# Patient Record
Sex: Male | Born: 1975 | Race: White | Hispanic: No | Marital: Single | Smoking: Current every day smoker
Health system: Southern US, Community
[De-identification: ages and names within clinical notes are randomized; demographics above are authoritative.]

## PROBLEM LIST (undated history)

## (undated) DIAGNOSIS — Z87442 Personal history of urinary calculi: Secondary | ICD-10-CM

## (undated) DIAGNOSIS — Z860100 Personal history of colon polyps, unspecified: Secondary | ICD-10-CM

## (undated) DIAGNOSIS — L0390 Cellulitis, unspecified: Secondary | ICD-10-CM

## (undated) DIAGNOSIS — I10 Essential (primary) hypertension: Secondary | ICD-10-CM

## (undated) DIAGNOSIS — R7303 Prediabetes: Secondary | ICD-10-CM

## (undated) HISTORY — DX: Personal history of colon polyps, unspecified: Z86.0100

## (undated) HISTORY — DX: Prediabetes: R73.03

## (undated) HISTORY — DX: Personal history of colonic polyps: Z86.010

## (undated) HISTORY — PX: VASECTOMY: SHX75

---
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
## 2006-09-15 HISTORY — PX: INGUINAL HERNIA REPAIR: SUR1180

## 2020-10-02 ENCOUNTER — Emergency Department (HOSPITAL_COMMUNITY): Payer: Worker's Compensation | Admitting: Anesthesiology

## 2020-10-02 ENCOUNTER — Observation Stay (HOSPITAL_COMMUNITY)
Admission: EM | Admit: 2020-10-02 | Discharge: 2020-10-03 | Disposition: A | Discharge status: Home / Self Care | Payer: Worker's Compensation | Attending: Orthopedic Surgery | Admitting: Orthopedic Surgery

## 2020-10-02 ENCOUNTER — Emergency Department (HOSPITAL_COMMUNITY): Payer: Self-pay

## 2020-10-02 ENCOUNTER — Emergency Department (HOSPITAL_COMMUNITY): Payer: Worker's Compensation

## 2020-10-02 ENCOUNTER — Emergency Department (HOSPITAL_COMMUNITY): Payer: Self-pay | Attending: Orthopedic Surgery

## 2020-10-02 ENCOUNTER — Other Ambulatory Visit: Payer: Self-pay

## 2020-10-02 ENCOUNTER — Encounter (HOSPITAL_COMMUNITY)
Admission: EM | Disposition: A | Discharge status: Home / Self Care | Payer: Self-pay | Source: Home / Self Care | Attending: Emergency Medicine

## 2020-10-02 DIAGNOSIS — Z20822 Contact with and (suspected) exposure to covid-19: Secondary | ICD-10-CM | POA: Insufficient documentation

## 2020-10-02 DIAGNOSIS — Y9269 Other specified industrial and construction area as the place of occurrence of the external cause: Secondary | ICD-10-CM | POA: Diagnosis not present

## 2020-10-02 DIAGNOSIS — W000XXA Fall on same level due to ice and snow, initial encounter: Secondary | ICD-10-CM | POA: Insufficient documentation

## 2020-10-02 DIAGNOSIS — S82852A Displaced trimalleolar fracture of left lower leg, initial encounter for closed fracture: Principal | ICD-10-CM | POA: Insufficient documentation

## 2020-10-02 DIAGNOSIS — T148XXA Other injury of unspecified body region, initial encounter: Secondary | ICD-10-CM

## 2020-10-02 DIAGNOSIS — S99912A Unspecified injury of left ankle, initial encounter: Secondary | ICD-10-CM | POA: Diagnosis present

## 2020-10-02 DIAGNOSIS — Y990 Civilian activity done for income or pay: Secondary | ICD-10-CM | POA: Diagnosis not present

## 2020-10-02 DIAGNOSIS — Z419 Encounter for procedure for purposes other than remedying health state, unspecified: Secondary | ICD-10-CM

## 2020-10-02 DIAGNOSIS — M21962 Unspecified acquired deformity of left lower leg: Secondary | ICD-10-CM

## 2020-10-02 HISTORY — PX: ORIF ANKLE FRACTURE: SHX5408

## 2020-10-02 LAB — RESP PANEL BY RT-PCR (FLU A&B, COVID) ARPGX2
Influenza A by PCR: NEGATIVE
Influenza B by PCR: NEGATIVE
SARS Coronavirus 2 by RT PCR: NEGATIVE

## 2020-10-02 SURGERY — OPEN REDUCTION INTERNAL FIXATION (ORIF) ANKLE FRACTURE
Anesthesia: General | Site: Ankle | Laterality: Left

## 2020-10-02 MED ORDER — PROPOFOL 10 MG/ML IV BOLUS
INTRAVENOUS | Status: AC
  Filled 2020-10-02: qty 20

## 2020-10-02 MED ORDER — LIDOCAINE-EPINEPHRINE (PF) 1.5 %-1:200000 IJ SOLN
INTRAMUSCULAR | Status: DC | PRN
  Administered 2020-10-02: 15 mL via PERINEURAL

## 2020-10-02 MED ORDER — PROMETHAZINE HCL 25 MG/ML IJ SOLN
6.2500 mg | INTRAMUSCULAR | Status: DC | PRN
Start: 2020-10-02 — End: 2020-10-02

## 2020-10-02 MED ORDER — 0.9 % SODIUM CHLORIDE (POUR BTL) OPTIME
TOPICAL | Status: DC | PRN
  Administered 2020-10-02: 1000 mL

## 2020-10-02 MED ORDER — POVIDONE-IODINE 10 % EX SWAB: 2.0000 "application " | Freq: Once | CUTANEOUS | Status: DC

## 2020-10-02 MED ORDER — MORPHINE SULFATE (PF) 2 MG/ML IV SOLN
0.5000 mg | INTRAVENOUS | Status: DC | PRN
Start: 2020-10-02 — End: 2020-10-03

## 2020-10-02 MED ORDER — CHLORHEXIDINE GLUCONATE 0.12 % MT SOLN: 15.0000 mL | Freq: Once | OROMUCOSAL | Status: AC

## 2020-10-02 MED ORDER — LIDOCAINE 2% (20 MG/ML) 5 ML SYRINGE
INTRAMUSCULAR | Status: DC | PRN
  Administered 2020-10-02: 100 mg via INTRAVENOUS

## 2020-10-02 MED ORDER — FENTANYL CITRATE (PF) 250 MCG/5ML IJ SOLN
INTRAMUSCULAR | Status: AC
  Filled 2020-10-02: qty 5

## 2020-10-02 MED ORDER — FENTANYL CITRATE (PF) 100 MCG/2ML IJ SOLN
INTRAMUSCULAR | Status: AC
  Administered 2020-10-02: 50 ug via INTRAVENOUS
  Filled 2020-10-02: qty 2

## 2020-10-02 MED ORDER — METHOCARBAMOL 1000 MG/10ML IJ SOLN: 500.0000 mg | Freq: Four times a day (QID) | INTRAVENOUS | Status: DC | PRN

## 2020-10-02 MED ORDER — ONDANSETRON HCL 4 MG/2ML IJ SOLN
INTRAMUSCULAR | Status: AC
  Filled 2020-10-02: qty 4

## 2020-10-02 MED ORDER — CEFAZOLIN SODIUM-DEXTROSE 2-4 GM/100ML-% IV SOLN
2.0000 g | INTRAVENOUS | Status: AC
  Administered 2020-10-02: 2 g via INTRAVENOUS
  Filled 2020-10-02: qty 100

## 2020-10-02 MED ORDER — ETOMIDATE 2 MG/ML IV SOLN
INTRAVENOUS | Status: AC | PRN
  Administered 2020-10-02: 10 mg via INTRAVENOUS

## 2020-10-02 MED ORDER — ACETAMINOPHEN 500 MG PO TABS
500.0000 mg | ORAL_TABLET | Freq: Four times a day (QID) | ORAL | Status: DC
  Administered 2020-10-02 – 2020-10-03 (×2): 500 mg via ORAL
  Filled 2020-10-02 (×3): qty 1

## 2020-10-02 MED ORDER — HYDROMORPHONE HCL 1 MG/ML IJ SOLN
1.0000 mg | Freq: Once | INTRAMUSCULAR | Status: AC
  Administered 2020-10-02: 1 mg via INTRAVENOUS
  Filled 2020-10-02: qty 1

## 2020-10-02 MED ORDER — METOCLOPRAMIDE HCL 5 MG PO TABS: 5.0000 mg | ORAL_TABLET | Freq: Three times a day (TID) | ORAL | Status: DC | PRN

## 2020-10-02 MED ORDER — CHLORHEXIDINE GLUCONATE 0.12 % MT SOLN
OROMUCOSAL | Status: AC
  Administered 2020-10-02: 15 mL via OROMUCOSAL
  Filled 2020-10-02: qty 15

## 2020-10-02 MED ORDER — HYDROCODONE-ACETAMINOPHEN 7.5-325 MG PO TABS
1.0000 | ORAL_TABLET | ORAL | Status: DC | PRN
Start: 2020-10-02 — End: 2020-10-03
  Administered 2020-10-02: 2 via ORAL
  Administered 2020-10-03: 1 via ORAL
  Administered 2020-10-03: 2 via ORAL
  Filled 2020-10-02 (×3): qty 2

## 2020-10-02 MED ORDER — PROPOFOL 10 MG/ML IV BOLUS
INTRAVENOUS | Status: DC | PRN
  Administered 2020-10-02: 200 mg via INTRAVENOUS

## 2020-10-02 MED ORDER — OXYCODONE HCL 5 MG/5ML PO SOLN: 5.0000 mg | Freq: Once | ORAL | Status: DC | PRN

## 2020-10-02 MED ORDER — DEXAMETHASONE SODIUM PHOSPHATE 10 MG/ML IJ SOLN
INTRAMUSCULAR | Status: AC
  Filled 2020-10-02: qty 2

## 2020-10-02 MED ORDER — ASPIRIN 81 MG PO CHEW
81.0000 mg | CHEWABLE_TABLET | Freq: Every day | ORAL | Status: DC
  Administered 2020-10-03: 81 mg via ORAL
  Filled 2020-10-02: qty 1

## 2020-10-02 MED ORDER — LACTATED RINGERS IV SOLN: INTRAVENOUS | Status: DC

## 2020-10-02 MED ORDER — CEFAZOLIN SODIUM-DEXTROSE 1-4 GM/50ML-% IV SOLN
1.0000 g | Freq: Four times a day (QID) | INTRAVENOUS | Status: DC
  Administered 2020-10-03 (×2): 1 g via INTRAVENOUS
  Filled 2020-10-02 (×2): qty 50

## 2020-10-02 MED ORDER — FENTANYL CITRATE (PF) 100 MCG/2ML IJ SOLN
INTRAMUSCULAR | Status: AC
  Filled 2020-10-02: qty 2

## 2020-10-02 MED ORDER — MIDAZOLAM HCL 2 MG/2ML IJ SOLN
2.0000 mg | Freq: Once | INTRAMUSCULAR | Status: DC
  Filled 2020-10-02: qty 2

## 2020-10-02 MED ORDER — METOCLOPRAMIDE HCL 5 MG/ML IJ SOLN: 5.0000 mg | Freq: Three times a day (TID) | INTRAMUSCULAR | Status: DC | PRN

## 2020-10-02 MED ORDER — MIDAZOLAM HCL 2 MG/2ML IJ SOLN: 2.0000 mg | Freq: Once | INTRAMUSCULAR | Status: AC

## 2020-10-02 MED ORDER — DEXAMETHASONE SODIUM PHOSPHATE 10 MG/ML IJ SOLN
INTRAMUSCULAR | Status: AC
  Filled 2020-10-02: qty 1

## 2020-10-02 MED ORDER — MIDAZOLAM HCL 2 MG/2ML IJ SOLN
INTRAMUSCULAR | Status: AC
  Administered 2020-10-02: 2 mg via INTRAVENOUS
  Filled 2020-10-02: qty 2

## 2020-10-02 MED ORDER — HYDROCODONE-ACETAMINOPHEN 5-325 MG PO TABS: 1.0000 | ORAL_TABLET | ORAL | Status: DC | PRN

## 2020-10-02 MED ORDER — ACETAMINOPHEN 500 MG PO TABS
1000.0000 mg | ORAL_TABLET | Freq: Once | ORAL | Status: AC
  Administered 2020-10-02: 1000 mg via ORAL
  Filled 2020-10-02: qty 2

## 2020-10-02 MED ORDER — FENTANYL CITRATE (PF) 100 MCG/2ML IJ SOLN
25.0000 ug | INTRAMUSCULAR | Status: DC | PRN
  Administered 2020-10-02: 50 ug via INTRAVENOUS
  Administered 2020-10-02: 25 ug via INTRAVENOUS

## 2020-10-02 MED ORDER — MIDAZOLAM HCL 2 MG/2ML IJ SOLN
INTRAMUSCULAR | Status: AC
  Filled 2020-10-02: qty 2

## 2020-10-02 MED ORDER — METHOCARBAMOL 500 MG PO TABS
500.0000 mg | ORAL_TABLET | Freq: Four times a day (QID) | ORAL | Status: DC | PRN
  Administered 2020-10-02: 500 mg via ORAL
  Filled 2020-10-02: qty 1

## 2020-10-02 MED ORDER — DEXAMETHASONE SODIUM PHOSPHATE 10 MG/ML IJ SOLN
INTRAMUSCULAR | Status: DC | PRN
  Administered 2020-10-02: 4 mg via INTRAVENOUS

## 2020-10-02 MED ORDER — PHENYLEPHRINE 40 MCG/ML (10ML) SYRINGE FOR IV PUSH (FOR BLOOD PRESSURE SUPPORT)
PREFILLED_SYRINGE | INTRAVENOUS | Status: AC
  Filled 2020-10-02: qty 10

## 2020-10-02 MED ORDER — OXYCODONE HCL 5 MG PO TABS: 5.0000 mg | ORAL_TABLET | Freq: Once | ORAL | Status: DC | PRN

## 2020-10-02 MED ORDER — ONDANSETRON HCL 4 MG/2ML IJ SOLN
INTRAMUSCULAR | Status: AC
  Filled 2020-10-02: qty 2

## 2020-10-02 MED ORDER — FENTANYL CITRATE (PF) 100 MCG/2ML IJ SOLN: 50.0000 ug | Freq: Once | INTRAMUSCULAR | Status: AC

## 2020-10-02 MED ORDER — MIDAZOLAM HCL 2 MG/2ML IJ SOLN
INTRAMUSCULAR | Status: AC | PRN
  Administered 2020-10-02: 1 mg via INTRAVENOUS

## 2020-10-02 MED ORDER — ROPIVACAINE HCL 5 MG/ML IJ SOLN
INTRAMUSCULAR | Status: DC | PRN
  Administered 2020-10-02: 35 mL via PERINEURAL

## 2020-10-02 MED ORDER — ONDANSETRON HCL 4 MG/2ML IJ SOLN: 4.0000 mg | Freq: Four times a day (QID) | INTRAMUSCULAR | Status: DC | PRN

## 2020-10-02 MED ORDER — LIDOCAINE 2% (20 MG/ML) 5 ML SYRINGE
INTRAMUSCULAR | Status: AC
  Filled 2020-10-02: qty 10

## 2020-10-02 MED ORDER — ONDANSETRON HCL 4 MG PO TABS: 4.0000 mg | ORAL_TABLET | Freq: Four times a day (QID) | ORAL | Status: DC | PRN

## 2020-10-02 MED ORDER — CHLORHEXIDINE GLUCONATE 4 % EX LIQD: 60.0000 mL | Freq: Once | CUTANEOUS | Status: DC

## 2020-10-02 MED ORDER — ETOMIDATE 2 MG/ML IV SOLN
10.0000 mg | Freq: Once | INTRAVENOUS | Status: DC
  Filled 2020-10-02: qty 10

## 2020-10-02 MED ORDER — DOCUSATE SODIUM 100 MG PO CAPS
100.0000 mg | ORAL_CAPSULE | Freq: Two times a day (BID) | ORAL | Status: DC
  Administered 2020-10-02 – 2020-10-03 (×2): 100 mg via ORAL
  Filled 2020-10-02 (×2): qty 1

## 2020-10-02 MED ORDER — ACETAMINOPHEN 325 MG PO TABS: 325.0000 mg | ORAL_TABLET | Freq: Four times a day (QID) | ORAL | Status: DC | PRN

## 2020-10-02 MED ORDER — ONDANSETRON 4 MG PO TBDP: 4.0000 mg | ORAL_TABLET | Freq: Three times a day (TID) | ORAL | 0 refills | Status: DC | PRN

## 2020-10-02 MED ORDER — NAPROXEN 250 MG PO TABS
250.0000 mg | ORAL_TABLET | Freq: Two times a day (BID) | ORAL | Status: DC
  Administered 2020-10-03: 250 mg via ORAL
  Filled 2020-10-02: qty 1

## 2020-10-02 MED ORDER — OXYCODONE HCL 5 MG PO TABS: 5.0000 mg | ORAL_TABLET | Freq: Three times a day (TID) | ORAL | 0 refills | Status: DC | PRN

## 2020-10-02 MED ORDER — ONDANSETRON HCL 4 MG/2ML IJ SOLN
INTRAMUSCULAR | Status: DC | PRN
Start: 2020-10-02 — End: 2020-10-02
  Administered 2020-10-02: 4 mg via INTRAVENOUS

## 2020-10-02 SURGICAL SUPPLY — 66 items
ALCOHOL 70% 16 OZ (MISCELLANEOUS) ×2 IMPLANT
BANDAGE ESMARK 6X9 LF (GAUZE/BANDAGES/DRESSINGS) IMPLANT
BIT DRILL 2.5 CANN STRL (BIT) ×2 IMPLANT
BNDG COHESIVE 4X5 TAN STRL (GAUZE/BANDAGES/DRESSINGS) ×2 IMPLANT
BNDG ELASTIC 4X5.8 VLCR STR LF (GAUZE/BANDAGES/DRESSINGS) IMPLANT
BNDG ELASTIC 6X10 VLCR STRL LF (GAUZE/BANDAGES/DRESSINGS) ×2 IMPLANT
BNDG ELASTIC 6X5.8 VLCR STR LF (GAUZE/BANDAGES/DRESSINGS) IMPLANT
BNDG ESMARK 6X9 LF (GAUZE/BANDAGES/DRESSINGS)
BNDG GAUZE ELAST 4 BULKY (GAUZE/BANDAGES/DRESSINGS) ×2 IMPLANT
CANISTER SUCT 3000ML PPV (MISCELLANEOUS) ×2 IMPLANT
COVER SURGICAL LIGHT HANDLE (MISCELLANEOUS) ×2 IMPLANT
COVER WAND RF STERILE (DRAPES) ×2 IMPLANT
CUFF TOURN SGL QUICK 34 (TOURNIQUET CUFF)
CUFF TRNQT CYL 34X4.125X (TOURNIQUET CUFF) IMPLANT
DRAPE C-ARM 42X72 X-RAY (DRAPES) ×2 IMPLANT
DRAPE C-ARMOR (DRAPES) ×2 IMPLANT
DRAPE U-SHAPE 47X51 STRL (DRAPES) ×2 IMPLANT
DRSG ADAPTIC 3X8 NADH LF (GAUZE/BANDAGES/DRESSINGS) ×2 IMPLANT
DRSG PAD ABDOMINAL 8X10 ST (GAUZE/BANDAGES/DRESSINGS) ×2 IMPLANT
DURAPREP 26ML APPLICATOR (WOUND CARE) ×2 IMPLANT
ELECT REM PT RETURN 9FT ADLT (ELECTROSURGICAL) ×2
ELECTRODE REM PT RTRN 9FT ADLT (ELECTROSURGICAL) ×1 IMPLANT
FIBULOCK IMPLANT SYSTEM STER (Miscellaneous) ×2 IMPLANT
GAUZE SPONGE 4X4 12PLY STRL (GAUZE/BANDAGES/DRESSINGS) ×2 IMPLANT
GLOVE BIO SURGEON STRL SZ7.5 (GLOVE) ×4 IMPLANT
GLOVE BIOGEL PI IND STRL 8 (GLOVE) ×2 IMPLANT
GLOVE BIOGEL PI INDICATOR 8 (GLOVE) ×2
GOWN STRL REUS W/ TWL LRG LVL3 (GOWN DISPOSABLE) ×2 IMPLANT
GOWN STRL REUS W/ TWL XL LVL3 (GOWN DISPOSABLE) ×2 IMPLANT
GOWN STRL REUS W/TWL LRG LVL3 (GOWN DISPOSABLE) ×4
GOWN STRL REUS W/TWL XL LVL3 (GOWN DISPOSABLE) ×2
GUIDEWIRE 1.35MM (WIRE) ×4 IMPLANT
IMPL TIGHTROP W/DRV K-LESS (Anchor) ×1 IMPLANT
IMPLANT TIGHTROPE W/DRV K-LESS (Anchor) ×2 IMPLANT
KIT BASIN OR (CUSTOM PROCEDURE TRAY) ×2 IMPLANT
KIT TURNOVER KIT B (KITS) ×2 IMPLANT
NAIL FIBULA IM 3X180 LT (Nail) ×2 IMPLANT
NEEDLE HYPO 25GX1X1/2 BEV (NEEDLE) IMPLANT
NS IRRIG 1000ML POUR BTL (IV SOLUTION) ×2 IMPLANT
PACK ORTHO EXTREMITY (CUSTOM PROCEDURE TRAY) ×2 IMPLANT
PAD ABD 8X10 STRL (GAUZE/BANDAGES/DRESSINGS) ×2 IMPLANT
PAD ARMBOARD 7.5X6 YLW CONV (MISCELLANEOUS) ×4 IMPLANT
PAD CAST 4YDX4 CTTN HI CHSV (CAST SUPPLIES) ×2 IMPLANT
PADDING CAST COTTON 4X4 STRL (CAST SUPPLIES) ×2
PADDING CAST COTTON 6X4 STRL (CAST SUPPLIES) ×2 IMPLANT
PLATE LOCKING TUBULAR 4H (Plate) ×2 IMPLANT
REAMER CANN LONG 3.2 STER (MISCELLANEOUS) ×2 IMPLANT
SCREW CAN THREAD 3X18 (Screw) ×2 IMPLANT
SCREW CORT 3.5X40 LP ANKLE (Screw) ×2 IMPLANT
SCREW CORT NL FMS 3.5X45 (Screw) ×2 IMPLANT
SCREW LOW PROFILE 3.5X35 (Screw) ×2 IMPLANT
SPLINT PLASTER CAST XFAST 5X30 (CAST SUPPLIES) ×1 IMPLANT
SPLINT PLASTER XFAST SET 5X30 (CAST SUPPLIES) ×1
SPONGE LAP 18X18 RF (DISPOSABLE) IMPLANT
SUCTION FRAZIER HANDLE 10FR (MISCELLANEOUS) ×1
SUCTION TUBE FRAZIER 10FR DISP (MISCELLANEOUS) ×1 IMPLANT
SUT ETHILON 3 0 PS 1 (SUTURE) ×6 IMPLANT
SUT VIC AB 0 CT1 27 (SUTURE)
SUT VIC AB 0 CT1 27XBRD ANBCTR (SUTURE) IMPLANT
SUT VIC AB 2-0 CT1 27 (SUTURE) ×1
SUT VIC AB 2-0 CT1 TAPERPNT 27 (SUTURE) ×1 IMPLANT
SYR CONTROL 10ML LL (SYRINGE) IMPLANT
SYSTEM IMPLANT FIBULOCK STRL (Miscellaneous) ×1 IMPLANT
TOWEL GREEN STERILE (TOWEL DISPOSABLE) ×4 IMPLANT
TUBE CONNECTING 12X1/4 (SUCTIONS) ×2 IMPLANT
YANKAUER SUCT BULB TIP NO VENT (SUCTIONS) ×2 IMPLANT

## 2020-10-02 NOTE — ED Triage Notes (Addendum)
Pt arrives via EMS, from work with complaints of left ankle injury. Pt slipped on ice and caught ankle in a hole. No other injuries reported, no LOC, A/O X4  20g LC.. Fent given by EMS  Pain 10/10

## 2020-10-02 NOTE — Consult Note (Signed)
Reason for Consult:Left ankle fx Referring Physician: Para Skeans Time called: 1119 Time at bedside: 1130   [name] is an 45 y.o. male.  HPI: Fender was walking today and his left foot went down into a hole in the ice. He fell and twisted as he fell. He had immediate left ankle pain and could not bear weight. He came to the ED where x-rays showed a trimal fx. He was reduced by the ED and then orthopedic surgery was consulted. The reduction was suboptimal. He lives alone on the second story and works as a Retail banker.  No past medical history on file.  No family history on file.  Social History:  has no history on file for tobacco use, alcohol use, and drug use.  Allergies:  Allergies  Allergen Reactions  . Tomato Nausea And Vomiting    Medications: I have reviewed the patient's current medications.  No results found for this or any previous visit (from the past 48 hour(s)).  DG Ankle Complete Left  Result Date: 10/02/2020 CLINICAL DATA:  Slipped on ice this morning, injury, deformity, M21.962 EXAM: LEFT ANKLE COMPLETE - 3+ VIEW COMPARISON:  08/06/2008 FINDINGS: Osseous mineralization normal. Fracture dislocation LEFT ankle. Transverse fracture medial malleolus displaced laterally. Lateral dislocation of talus at ankle joint. Displaced posterior malleolar fracture fragment. Oblique fracture of distal LEFT fibular diaphysis, comminuted, with apex medial and anterior angulation. Small non fused ossicle at tip of lateral malleolus. Small Achilles insertion calcaneal spur. IMPRESSION: Displaced fractures of distal LEFT fibular diaphysis, medial malleolus and posterior malleolus with lateral dislocation of talus at LEFT ankle joint. Electronically Signed   By: Ulyses Southward M.D.   On: 10/02/2020 09:56   DG Ankle Left Port  Result Date: 10/02/2020 CLINICAL DATA:  Post reduction left ankle. EXAM: PORTABLE LEFT ANKLE - 2 VIEW COMPARISON:  10/02/2020. FINDINGS: Post reduction  trimalleolar fracture/dislocation. Improved anatomic alignment. Persistent displaced medial malleolar fracture fragments. Lateral subluxation of the talus. IMPRESSION: Post reduction trimalleolar fracture/dislocation with improved anatomic alignment. Persistent displaced medial malleolar fracture fragments. Lateral subluxation of the talus. Electronically Signed   By: Maisie Fus  Register   On: 10/02/2020 11:09    Review of Systems  HENT: Negative for ear discharge, ear pain, hearing loss and tinnitus.   Eyes: Negative for photophobia and pain.  Respiratory: Negative for cough and shortness of breath.   Cardiovascular: Negative for chest pain.  Gastrointestinal: Negative for abdominal pain, nausea and vomiting.  Genitourinary: Negative for dysuria, flank pain, frequency and urgency.  Musculoskeletal: Positive for arthralgias (Left ankle). Negative for back pain, myalgias and neck pain.  Neurological: Negative for dizziness and headaches.  Hematological: Does not bruise/bleed easily.  Psychiatric/Behavioral: The patient is not nervous/anxious.    Blood pressure 134/78, pulse 77, temperature 98.7 F (37.1 C), temperature source Oral, resp. rate 16, SpO2 100 %. Physical Exam Constitutional:      General: He is not in acute distress.    Appearance: He is well-developed and well-nourished. He is not diaphoretic.  HENT:     Head: Normocephalic and atraumatic.  Eyes:     General: No scleral icterus.       Right eye: No discharge.        Left eye: No discharge.     Conjunctiva/sclera: Conjunctivae normal.  Cardiovascular:     Rate and Rhythm: Normal rate and regular rhythm.  Pulmonary:     Effort: Pulmonary effort is normal. No respiratory distress.  Musculoskeletal:  Cervical back: Normal range of motion.     Comments: LLE No traumatic wounds, ecchymosis, or rash  Short leg splint in place  No knee effusion  Knee stable to varus/ valgus and anterior/posterior stress  Sens DPN, SPN, TN  intact  Motor EHL 5/5  Toes perfused, No significant edema  Skin:    General: Skin is warm and dry.  Neurological:     Mental Status: He is alert.  Psychiatric:        Mood and Affect: Mood and affect normal.        Behavior: Behavior normal.     Assessment/Plan: Left ankle fx -- Will plan ORIF this afternoon by Dr. Aundria Rud. Please keep NPO. Anticipate discharge after surgery. Tobacco use    Freeman Caldron, PA-C Orthopedic Surgery [phone] 10/02/2020, 11:41 AM

## 2020-10-02 NOTE — Anesthesia Postprocedure Evaluation (Signed)
Anesthesia Post Note  Patient: [name]  Procedure(s) Performed: OPEN REDUCTION INTERNAL FIXATION (ORIF) ANKLE FRACTURE (Left Ankle)     Patient location during evaluation: PACU Anesthesia Type: General Level of consciousness: awake and alert Pain management: pain level controlled Vital Signs Assessment: post-procedure vital signs reviewed and stable Respiratory status: spontaneous breathing, nonlabored ventilation, respiratory function stable and patient connected to nasal cannula oxygen Cardiovascular status: blood pressure returned to baseline and stable Postop Assessment: no apparent nausea or vomiting Anesthetic complications: no   No complications documented.  Last Vitals:  Vitals:   10/02/20 1740 10/02/20 1755  BP: (!) 143/86 139/89  Pulse: 82 86  Resp: 20 16  Temp: (!) 36.2 C   SpO2: 99% 99%    Last Pain:  Vitals:   10/02/20 1740  TempSrc:   PainSc: 4                  Vitoria Conyer S

## 2020-10-02 NOTE — Transfer of Care (Signed)
Immediate Anesthesia Transfer of Care Note  Patient: [name]  Procedure(s) Performed: OPEN REDUCTION INTERNAL FIXATION (ORIF) ANKLE FRACTURE (Left Ankle)  Patient Location: PACU  Anesthesia Type:GA combined with regional for post-op pain  Level of Consciousness: drowsy and patient cooperative  Airway & Oxygen Therapy: Patient Spontanous Breathing and Patient connected to nasal cannula oxygen  Post-op Assessment: Report given to RN, Post -op Vital signs reviewed and stable and Patient moving all extremities  Post vital signs: Reviewed and stable  Last Vitals:  Vitals Value Taken Time  BP 143/86 10/02/20 1736  Temp    Pulse 82 10/02/20 1737  Resp    SpO2 99 % 10/02/20 1737  Vitals shown include unvalidated device data.  Last Pain:  Vitals:   10/02/20 1430  TempSrc:   PainSc: 4          Complications: No complications documented.

## 2020-10-02 NOTE — Sedation Documentation (Signed)
Wasted 1 mg versed in sink; witnessed by Emilio Math, RN

## 2020-10-02 NOTE — Discharge Instructions (Signed)
-   He will be strictly nonweightbearing to the left lower extremity.  This will be the case for the next 6 weeks.  -You should elevate your left lower extremity with "toes above nose," as much as able throughout the day.  -For mild to moderate pain issues Tylenol and Advil around-the-clock as necessary.  For any breakthrough pain use oxycodone as necessary.  -Keep your splint clean and dry at all times.  You should not allow this to become soiled or saturated.  -For the prevention of blood clots she should take an 81 mg aspirin once per day for 6 weeks.  -Return to see Dr. Aundria Rud in the office in 2 weeks for routine postoperative care and suture removal.

## 2020-10-02 NOTE — Anesthesia Procedure Notes (Signed)
Procedure Name: LMA Insertion Date/Time: 10/02/2020 3:50 PM Performed by: Shireen Quan, CRNA Pre-anesthesia Checklist: Patient identified, Emergency Drugs available, Suction available and Patient being monitored Patient Re-evaluated:Patient Re-evaluated prior to induction Oxygen Delivery Method: Circle System Utilized Preoxygenation: Pre-oxygenation with 100% oxygen Induction Type: IV induction Ventilation: Mask ventilation without difficulty LMA: LMA inserted LMA Size: 5.0 Number of attempts: 1 Placement Confirmation: positive ETCO2 Tube secured with: Tape Dental Injury: Teeth and Oropharynx as per pre-operative assessment

## 2020-10-02 NOTE — Brief Op Note (Signed)
10/02/2020  5:08 PM  PATIENT:  [name]  45 y.o. male  PRE-OPERATIVE DIAGNOSIS:  Left ankle trimalleolar fracture, closed.  POST-OPERATIVE DIAGNOSIS: Same  PROCEDURE:  Procedure(s): OPEN REDUCTION INTERNAL FIXATION (ORIF) ANKLE FRACTURE (Left)  SURGEON:  Surgeon(s) and Role:    * Yolonda Kida, MD - Primary  PHYSICIAN ASSISTANT: Dion Saucier, PA-C   ANESTHESIA:   regional and general  EBL:  20 cc  BLOOD ADMINISTERED:none  DRAINS: none   LOCAL MEDICATIONS USED:  NONE  SPECIMEN:  No Specimen  DISPOSITION OF SPECIMEN:  N/A  COUNTS:  YES  TOURNIQUET:  * Missing tourniquet times found for documented tourniquets in log: [id number] *  DICTATION: .Note written in EPIC  PLAN OF CARE: Admit for overnight observation  PATIENT DISPOSITION:  PACU - hemodynamically stable.   Delay start of Pharmacological VTE agent (>24hrs) due to surgical blood loss or risk of bleeding: not applicable

## 2020-10-02 NOTE — Anesthesia Preprocedure Evaluation (Addendum)
Anesthesia Evaluation  Patient identified by MRN, date of birth, ID band Patient awake    Reviewed: Allergy & Precautions, H&P , NPO status , Patient's Chart, lab work & pertinent test results  History of Anesthesia Complications Negative for: history of anesthetic complications  Airway Mallampati: II  TM Distance: >3 FB Neck ROM: Full    Dental no notable dental hx.    Pulmonary neg pulmonary ROS,    Pulmonary exam normal breath sounds clear to auscultation       Cardiovascular negative cardio ROS Normal cardiovascular exam Rhythm:Regular Rate:Normal     Neuro/Psych negative neurological ROS  negative psych ROS   GI/Hepatic negative GI ROS, Neg liver ROS,   Endo/Other  negative endocrine ROS  Renal/GU negative Renal ROS  negative genitourinary   Musculoskeletal negative musculoskeletal ROS (+) Left ankle fx   Abdominal   Peds negative pediatric ROS (+)  Hematology negative hematology ROS (+)   Anesthesia Other Findings Day of surgery medications reviewed with patient.  Reproductive/Obstetrics negative OB ROS                            Anesthesia Physical Anesthesia Plan  ASA: I  Anesthesia Plan: General   Post-op Pain Management: GA combined w/ Regional for post-op pain   Induction: Intravenous  PONV Risk Score and Plan: 2 and Treatment may vary due to age or medical condition, Ondansetron, Dexamethasone and Midazolam  Airway Management Planned: LMA  Additional Equipment: None  Intra-op Plan:   Post-operative Plan: Extubation in OR  Informed Consent:   Plan Discussed with:   Anesthesia Plan Comments:        Anesthesia Quick Evaluation

## 2020-10-02 NOTE — Anesthesia Procedure Notes (Signed)
Anesthesia Regional Block: Popliteal block   Pre-Anesthetic Checklist: ,, timeout performed, Correct Patient, Correct Site, Correct Laterality, Correct Procedure, Correct Position, site marked, Risks and benefits discussed,  Surgical consent,  Pre-op evaluation,  At surgeon's request and post-op pain management  Laterality: Left  Prep: chloraprep       Needles:  Injection technique: Single-shot  Needle Type: Echogenic Needle     Needle Length: 9cm      Additional Needles:   Procedures:,,,, ultrasound used (permanent image in chart),,,,  Narrative:  Start time: 10/02/2020 3:21 PM End time: 10/02/2020 3:28 PM Injection made incrementally with aspirations every 5 mL.  Performed by: Personally  Anesthesiologist: Eilene Ghazi, MD  Additional Notes: Patient tolerated the procedure well without complications

## 2020-10-02 NOTE — ED Notes (Signed)
Readjusted leg in bed and took towels out from underneath and placed then on each side to maintain more stability of ankle. Pt stated some relief in pain after doing so. Ortho tech called .

## 2020-10-02 NOTE — Anesthesia Procedure Notes (Signed)
Anesthesia Regional Block: Adductor canal block   Pre-Anesthetic Checklist: ,, timeout performed, Correct Patient, Correct Site, Correct Laterality, Correct Procedure, Correct Position, site marked, Risks and benefits discussed,  Surgical consent,  Pre-op evaluation,  At surgeon's request and post-op pain management  Laterality: Left  Prep: chloraprep       Needles:  Injection technique: Single-shot  Needle Type: Echogenic Needle     Needle Length: 9cm      Additional Needles:   Procedures:,,,, ultrasound used (permanent image in chart),,,,  Narrative:  Start time: 10/02/2020 3:29 PM End time: 10/02/2020 3:34 PM Injection made incrementally with aspirations every 5 mL.  Performed by: Personally  Anesthesiologist: Eilene Ghazi, MD  Additional Notes: Patient tolerated the procedure well without complications

## 2020-10-02 NOTE — Progress Notes (Signed)
Orthopedic Tech Progress Note Patient Details:  [name] 1975-12-01 [id number] MD/PA did a reduction of the ANKLE Ortho Devices Type of Ortho Device: Short leg splint,Stirrup splint Ortho Device/Splint Location: LLE Ortho Device/Splint Interventions: Application,Ordered,Adjustment   Post Interventions Patient Tolerated: Well Instructions Provided: Care of device   Donald Pore 10/02/2020, 10:51 AM

## 2020-10-02 NOTE — ED Notes (Signed)
Ortho paged. 

## 2020-10-02 NOTE — ED Provider Notes (Signed)
[facility] EMERGENCY DEPARTMENT Provider Note   CSN: [id number] Arrival date & time: 10/02/20  6073     History Chief Complaint  Patient presents with  . Ankle Pain    Calvin DOUBLIN is a 45 y.o. male.  Patient presents with complaint of left ankle injury.  Patient states that he was salting icy area outside when he lost his balance slipped and fell.  Reports that while falling his left leg was caught underneath him.  Patient denies hitting his head or any loss of consciousness.  Patient denies any head neck or back pain.  Denies any other injuries.  Patient denies taking any blood thinners.  EMS reports patient received 150 mcg of fentanyl.    HPI     No past medical history on file.  Patient Active Problem List   Diagnosis Date Noted  . Closed displaced trimalleolar fracture of left ankle 10/02/2020         No family history on file.     Home Medications Prior to Admission medications   Medication Sig Start Date End Date Taking? Authorizing Provider  ibuprofen (ADVIL) 200 MG tablet Take 200 mg by mouth every 6 (six) hours as needed for headache.   Yes [provider]  ondansetron (ZOFRAN ODT) 4 MG disintegrating tablet Take 1 tablet (4 mg total) by mouth every 8 (eight) hours as needed. 10/02/20  Yes Yolonda Kida, MD  oxyCODONE (ROXICODONE) 5 MG immediate release tablet Take 1 tablet (5 mg total) by mouth every 8 (eight) hours as needed. 10/02/20 10/02/21 Yes Yolonda Kida, MD    Allergies    Tomato  Review of Systems   Review of Systems  Musculoskeletal: Negative for back pain and neck pain.  Neurological: Negative for syncope.  Psychiatric/Behavioral: Negative for confusion.    Physical Exam Updated Vital Signs BP 131/88 (BP Location: Right Arm)   Pulse 81   Temp (!) 97.3 F (36.3 C)   Resp 20   Ht 6\' 1"  (1.854 m)   Wt 117.9 kg   SpO2 98%   BMI 34.30 kg/m   Physical Exam Vitals and nursing note  reviewed.  Constitutional:      General: He is not in acute distress.    Appearance: He is not ill-appearing, toxic-appearing or diaphoretic.  HENT:     Head: Normocephalic and atraumatic.  Eyes:     General: No scleral icterus.       Right eye: No discharge.        Left eye: No discharge.  Cardiovascular:     Rate and Rhythm: Normal rate.  Pulmonary:     Effort: Pulmonary effort is normal.  Musculoskeletal:     Cervical back: No deformity, tenderness or bony tenderness. No spinous process tenderness or muscular tenderness.     Thoracic back: No deformity, tenderness or bony tenderness.     Lumbar back: No deformity, tenderness or bony tenderness.     Right hip: No deformity, tenderness or bony tenderness.     Left hip: No deformity, tenderness or bony tenderness.     Left upper leg: No deformity, tenderness or bony tenderness.     Left knee: No deformity or bony tenderness. No tenderness.     Left lower leg: Tenderness and bony tenderness present.     Left ankle: Deformity and ecchymosis present. No lacerations. Tenderness present.     Left Achilles Tendon: Tenderness present.     Comments: Patient's left ankle was  obviously deformed with eversion of foot and skin tenting.    Skin:    General: Skin is warm and dry.  Neurological:     General: No focal deficit present.     Mental Status: He is alert.  Psychiatric:        Behavior: Behavior is cooperative.        ED Results / Procedures / Treatments   Labs (all labs ordered are listed, but only abnormal results are displayed) Labs Reviewed  RESP PANEL BY RT-PCR (FLU A&B, COVID) [id number]    EKG None  Radiology DG Ankle Complete Left  Result Date: 10/02/2020 CLINICAL DATA:  Slipped on ice this morning, injury, deformity, M21.962 EXAM: LEFT ANKLE COMPLETE - 3+ VIEW COMPARISON:  08/06/2008 FINDINGS: Osseous mineralization normal. Fracture dislocation LEFT ankle. Transverse fracture medial malleolus displaced laterally.  Lateral dislocation of talus at ankle joint. Displaced posterior malleolar fracture fragment. Oblique fracture of distal LEFT fibular diaphysis, comminuted, with apex medial and anterior angulation. Small non fused ossicle at tip of lateral malleolus. Small Achilles insertion calcaneal spur. IMPRESSION: Displaced fractures of distal LEFT fibular diaphysis, medial malleolus and posterior malleolus with lateral dislocation of talus at LEFT ankle joint. Electronically Signed   By: Ulyses Southward M.D.   On: 10/02/2020 09:56   CT ANKLE LEFT WO CONTRAST  Result Date: 10/02/2020 CLINICAL DATA:  Fall on ice, ankle fracture EXAM: CT OF THE LEFT ANKLE WITHOUT CONTRAST TECHNIQUE: Multidetector CT imaging of the left ankle was performed according to the standard protocol. Multiplanar CT image reconstructions were also generated. COMPARISON:  Radiographs from 10/02/2020 FINDINGS: Bones/Joint/Cartilage Stage 4 Weber C fracture of the left ankle noted including transverse fracture the medial malleolus, oblique fracture of the posterior malleolus, and a fibular fracture above the level of the syndesmosis. The fibular fracture has some mild comminution including an oblique component as well as the transverse component. On image 68 of series 3 we demonstrate a 0.7 by 0.7 by 0.9 cm bony fragment along the anterior tibiofibular articulation as well as a couple of other small bony fragments particularly along the posterior malleolar fracture plane. Posterior malleolar fracture is distracted up to 1.3 cm laterally. Small corticated ossific structures along the inferior tip of the fibula. The long axis of the shaft of the tibia is dislocated about 1.2 cm anterior and about 0.7 cm medial with respect to the talus. Plantar and Achilles calcaneal spurs noted. Ligaments Suboptimally assessed by CT. Muscles and Tendons No flexor tendon entrapment. Trace amount of gas adjacent to the extensor digitorum longus tendon on image 69 of series 4.  Soft tissues Expected subcutaneous edema/hematoma medially, laterally, and dorsally along the ankle. IMPRESSION: 1. Stage 4 Weber C fracture of the left ankle including transverse fracture the medial malleolus, oblique fracture of the posterior malleolus, and a fibular fracture above the level of the syndesmosis. The long axis of the shaft of the tibia is dislocated about 1.2 cm anterior and medial with respect to the talus. 2. Trace amount of gas adjacent to the extensor digitorum longus tendon. 3. Expected subcutaneous edema/hematoma medially, laterally, and dorsally along the ankle. 4. Plantar and Achilles calcaneal spurs. Electronically Signed   By: Gaylyn Rong M.D.   On: 10/02/2020 13:50   DG Ankle Left Port  Result Date: 10/02/2020 CLINICAL DATA:  Post reduction left ankle. EXAM: PORTABLE LEFT ANKLE - 2 VIEW COMPARISON:  10/02/2020. FINDINGS: Post reduction trimalleolar fracture/dislocation. Improved anatomic alignment. Persistent displaced medial malleolar fracture fragments. Lateral subluxation of  the talus. IMPRESSION: Post reduction trimalleolar fracture/dislocation with improved anatomic alignment. Persistent displaced medial malleolar fracture fragments. Lateral subluxation of the talus. Electronically Signed   By: Maisie Fus  Register   On: 10/02/2020 11:09    Procedures Reduction of fracture  Date/Time: 10/02/2020 10:40 AM Performed by: Haskel Schroeder, PA-C Authorized by: Haskel Schroeder, PA-C  Consent: Verbal consent obtained. Risks and benefits: risks, benefits and alternatives were discussed Consent given by: patient Imaging studies: imaging studies available  Sedation: Patient sedated: yes Sedation type: moderate (conscious) sedation  Patient tolerance: patient tolerated the procedure well with no immediate complications Comments: He has displaced fracture of distal left fibular diaphysis, medial malleolus and posterior malleolus with lateral dislocation of talus  at left ankle joint.  Obvious deformity and skin tenting prior to reduction.  Conscious sedation was overseen by attending physician Dr. Particia Nearing.  Left ankle was reduced, without complication or difficulty.  +3 pulse to dorsalis pedis after reduction.  Patient splinted by Ortho tech after reduction.      (including critical care time)  Medications Ordered in ED Medications  etomidate (AMIDATE) injection 10 mg ( Intravenous MAR Hold 10/02/20 1450)  midazolam (VERSED) injection 2 mg ( Intravenous MAR Hold 10/02/20 1450)  chlorhexidine (HIBICLENS) 4 % liquid 4 application (has no administration in time range)  povidone-iodine 10 % swab 2 application (has no administration in time range)  oxyCODONE (Oxy IR/ROXICODONE) immediate release tablet 5 mg (has no administration in time range)    Or  oxyCODONE (ROXICODONE) 5 MG/5ML solution 5 mg (has no administration in time range)  fentaNYL (SUBLIMAZE) injection 25-50 mcg (25 mcg Intravenous Given 10/02/20 1748)  promethazine (PHENERGAN) injection 6.25-12.5 mg (has no administration in time range)  lactated ringers infusion ( Intravenous Anesthesia Volume Adjustment 10/02/20 1731)  HYDROmorphone (DILAUDID) injection 1 mg (1 mg Intravenous Given 10/02/20 0933)  midazolam (VERSED) injection (1 mg Intravenous Given 10/02/20 1039)  etomidate (AMIDATE) injection (10 mg Intravenous Given 10/02/20 1039)  ceFAZolin (ANCEF) IVPB 2g/100 mL premix (2 g Intravenous Given 10/02/20 1555)  acetaminophen (TYLENOL) tablet 1,000 mg (1,000 mg Oral Given 10/02/20 1517)  HYDROmorphone (DILAUDID) injection 1 mg (1 mg Intravenous Given 10/02/20 1347)  chlorhexidine (PERIDEX) 0.12 % solution 15 mL (15 mLs Mouth/Throat Given 10/02/20 1517)  fentaNYL (SUBLIMAZE) injection 50 mcg (50 mcg Intravenous Given 10/02/20 1523)  midazolam (VERSED) injection 2 mg (2 mg Intravenous Given 10/02/20 1523)  fentaNYL (SUBLIMAZE) 100 MCG/2ML injection (  Duplicate 10/02/20 1806)    ED Course  I have  reviewed the triage vital signs and the nursing notes.  Pertinent labs & imaging results that were available during my care of the patient were reviewed by me and considered in my medical decision making (see chart for details).    MDM Rules/Calculators/A&P                          Alert 45 year old male brought to emergency department by [facility] after suffering a fall and injuring his left ankle.  Patient reports slipped and fell on ice at ground-level.  Patient denies hitting his head or any loss of consciousness.  Patient denies any head neck or back pain.  Patient not on any blood thinners.  X-ray showed displaced fractures of distal left fibular diaphysis, medial malleolus and posterior malleolus with lateral dislocation of talus left ankle joint.    Patient was given Dilaudid for pain management.  10:39 patient was placed in conscious sedation  and left ankle fracture was reduced.  Conscious sedation was monitored by Dr. Particia NearingHaviland.  +3 dorsalis pedis pulse observed after reduction.  After reduction patient was placed in splint by [employer].  Postreduction x-ray showed improved anatomic alignment; persistent displaced medial malleolus fracture fragments; lateral subluxation of the talus.  Due to fracture appearing to be unstable orthopedic consult was placed  11:22 Spoke with PA Charma IgoMichael Jeffery with ortho.  Who reviewed images and agree to see the patient.  After seeing the patient PA Tinnie GensJeffrey stated he would contact orthopedic surgeon for surgical fixation today.  Patient was agreeable with this plan.  Patient was discussed with and evaluated by Dr. Particia NearingHaviland.   Final Clinical Impression(s) / ED Diagnoses Final diagnoses:  Deformity of ankle joint, left  Dislocation closed    Rx / DC Orders ED Discharge Orders         Ordered    oxyCODONE (ROXICODONE) 5 MG immediate release tablet  Every 8 hours PRN        10/02/20 1732    ondansetron (ZOFRAN ODT) 4 MG disintegrating tablet   Every 8 hours PRN        10/02/20 1732           Haskel SchroederBadalamente, [place] M[place] R, PA-C 10/02/20 1830    Jacalyn LefevreHaviland, Julie, MD 10/03/20 [phone]

## 2020-10-02 NOTE — Op Note (Signed)
Date of Surgery: 10/02/2020  INDICATIONS: Mr. [name] is a 45 y.o.-year-old male who sustained a left ankle closed trimalleolar fracture; he was indicated for open reduction and internal fixation due to the displaced nature of the articular fracture and came to the operating room today for this procedure. The patient and And his father did consent to the procedure after discussion of the risks and benefits.  PREOPERATIVE DIAGNOSIS: left trimalleolar ankle fracture  POSTOPERATIVE DIAGNOSIS: Same.  PROCEDURE:  1. Open treatment of left ankle fracture with internal fixation.  Trimalleolar w/ fixation of posterior malleolus CPT 27823. 2.  Open treatment of syndesmotic disruption, distal tibiofibular joint, left.  SURGEON: Jaqwan P. Aundria Rud, M.D.  ASSIST: Dion Saucier, PA-C.  Assistant attestation: PA Mcclung was utilized throughout the procedure for positioning the patient, manipulating the fracture and maintaining reduction, instrumentation of the ankle, and closure of wounds as well as application of splint and transported to PACU.  ANESTHESIA:  general, with peripheral nerve block  TOURNIQUET TIME: 300 mmHg left thigh x75 minutes  IV FLUIDS AND URINE: See anesthesia.  ESTIMATED BLOOD LOSS: 20 mL.  IMPLANTS:  Arthrex fibula lock fibular nail 3.0 mm x 180 mm Distal interlock screw x1 Tight rope device for syndesmotic fixation x1  Posterior 4-hole one third tubular plate x1 with 3 cortical 3.5 mm nonlocking screws.   COMPLICATIONS: None.  DESCRIPTION OF PROCEDURE: The patient was brought to the operating room and placed supine on the operating table.  The patient had been signed prior to the procedure and this was documented. The patient had the anesthesia placed by the anesthesiologist.  A nonsterile tourniquet was placed on the upper thigh.  The prep verification and incision time-outs were performed to confirm that this was the correct patient, site, side and location. The  patient had an SCD on the opposite lower extremity. The patient did receive antibiotics prior to the incision and was re-dosed during the procedure as needed at indicated intervals.  The patient had the lower extremity prepped and draped in the standard surgical fashion.  The extremity was exsanguinated using an esmarch bandage and the tourniquet was inflated to 300 mm Hg.   We began the procedure by establishing a posterior lateral interval incision.  This was made equal distance between the Achilles tendon and posterior lateral border of the fibula.  We established approximately 15 cm incision.  Dissection was carried down carefully through the subcutaneous tissue.  We did encounter the sural nerve traversing the posterior flap and then coursing distally across into the anterior aspect of the foot.  This was dissected out and protected.  We then opened up the peroneal fascia.  The peroneal tendons and muscle units were then retracted laterally so that we could approach the posterior aspect of the distal tibia.  We then bluntly reflected from lateral to medial the flexor houses longus off of the posterior aspect of the distal tibia.  We then identified the fracture.  This was a chevron shaped fracture with the apex noted.  We will be able to reduce this with periosteal elevator and dorsiflexion of the foot.  This was pinned in place.  We then applied a 4-hole one third tubular plate.  We first placed a distal screw outside of the plate to provide some provisional compression.  We then placed the apex screw in the plate and formally compressed through the fracture.  Of note, there was a small impacted piece of comminution noted on the radiograph.  This was  correlating with preoperative CT.  This was very small and felt to not be significant enough to completely enter the joint.  We then placed 1 more screw in the one third tubular plate.  This was also a 3.5 millimeter screw.  All the screws had excellent  compression.  AP and lateral fluoroscopic pictures were obtained which confirmed the reduction.  Next, we turned our attention to the comminuted and segmental midshaft fibular fracture.  There was also an associated syndesmotic disruption noted on CT scan.  Due to the proximal nature of the fibula fracture and need for syndesmotic fixation we elected to use a fibular nail.  The Arthrex fibula was chosen.  A guidepin was placed in the center center starting position.  We then course of this guidepin passed the fracture and proximal manner.  We then drilled distally in a retrograde fashion to open the fibular canal.  We then selected the 3.0 mm x 180 mm long nail.  This was malleted in place.  Reduction maneuver was maintained while placing the hardware.  Once we had impacted the nail deep enough we then placed the distal interlock screw.  This had good compression.  We then took radiographs of the fibular nail in place.  We were happy with the reduction, length, and rotation.   The syndesmosis was stressed using live fluoroscopy and found to be unstable, and there was also a tip fracture of the anterior aspect of the medial malleolus.  We then moved to the open reduction internal fixation with tight rope fixation device through the fibula nail for the distal tibiofibular joint.  While directly visualizing the syndesmosis we drilled from lateral cortex of the fibula through the medial cortex of the tibia.  This was a 4 cortices drill.  We then placed the syndesmotic reduction device from lateral to medial across the distal tibia.  We flipped the Endobutton on the medial cortex of the tibia.  We then applied compression force across the syndesmosis such that the distal fibula was able to be nicely reduced into the incisura.  Once were satisfied with this reduction with the foot in neutral dorsiflexion of the heel elevated off the bed we cut the suture tight rope device wires.  We then obtained AP, oblique,  stress oblique, and lateral radiographs that confirmed adequate reduction and position of all hardware.  At this time, we elected to not make a medial incision as the syndesmosis and medial clear space was adequately reduced.  We noted on the preoperative CT and x-rays for the medial malleolar piece to be quite small and without any significant articular surface attached.  We felt that leaving this to heal in a slight malunited position would be acceptable.  The wounds were irrigated, and closed with vicryl with routine closure for the skin. The wounds were injected with local anesthetic. Sterile gauze was applied followed by a posterior splint. He was awakened and returned to the PACU in stable and satisfactory condition. There were no complications.    POSTOPERATIVE PLAN: [name] will remain nonweightbearing on this leg for approximately 6 weeks; [name] will return for suture removal in 2 weeks.  He will be immobilized in a short leg splint and then transitioned to a CAM walker at his first follow up appointment.  [name] will receive DVT prophylaxis based on other medications, activity level, and risk ratio of bleeding to thrombosis.  At this time he does not have a safe place to discharge home to  today.  Therefore, we will keep him in the hospital at least tonight and possibly other night.  He will get physical therapy.  Maryan Rued, MD [facility] [place] [phone] 5:12 PM

## 2020-10-02 NOTE — ED Notes (Signed)
Patient was given a Urinal and Mouth swab.

## 2020-10-02 NOTE — Anesthesia Procedure Notes (Signed)
Anesthesia Procedure Image    

## 2020-10-03 ENCOUNTER — Encounter (HOSPITAL_COMMUNITY): Payer: Self-pay | Admitting: Orthopedic Surgery

## 2020-10-03 NOTE — Evaluation (Signed)
Physical Therapy Evaluation Patient Details Name: [name] MRN: [id number] DOB: Dec 05, 1975 Today's Date: 10/03/2020   History of Present Illness  Gailen was walking today and his left foot went down into a hole in the ice. He fell and twisted as he fell. He had immediate left ankle pain and could not bear weight. He came to the ED where x-rays showed a trimal fx. He was reduced by the ED and then orthopedic surgery was consulted. The reduction was suboptimal. He lives alone on the second story and works as a Paediatric nurse.  has no past medical history on file.  Clinical Impression   Patient evaluated by Physical Therapy with no further acute PT needs identified, as plan is for dc today. All education has been completed and the patient has no further questions. Demonstrated good use of cruthces for amb and stair negotiation, and able to maintain NWB well;  See below for any follow-up Physical Therapy or equipment needs. PT is signing off. Thank you for this referral.  OK for dc home from PT standpoint.     Follow Up Recommendations Outpatient PT (Potential need for Outpt PT can be addressed at Ortho follow up appointments)    Equipment Recommendations  Crutches    Recommendations for Other Services       Precautions / Restrictions Precautions Precautions: None Restrictions Weight Bearing Restrictions: Yes LLE Weight Bearing: Non weight bearing      Mobility  Bed Mobility Overal bed mobility: Independent                  Transfers Overall transfer level: Modified independent Equipment used: Rolling walker (2 wheeled);Crutches             General transfer comment: No difficulty with sit to stand on RLE; maintains LLE NWB well; occasional decr control of stand to sit, but that seems more pt's choice more than because of weakness  Ambulation/Gait Ambulation/Gait assistance: Supervision;Modified independent (Device/Increase time) Gait Distance (Feet):  120 Feet Assistive device: Crutches Gait Pattern/deviations: Step-through pattern Gait velocity: quick at times   General Gait Details: Managing crutch walking with swing-through pattern very well; if anything, at times asked him to step a bit slower to show more control  Stairs Stairs: Yes Stairs assistance: Min guard;Supervision;Modified independent (Device/Increase time) Stair Management: One rail Right;With crutches;Step to pattern Number of Stairs: 12 General stair comments: cues for sequence and technique; managing stairs quite well  Wheelchair Mobility    Modified Rankin (Stroke Patients Only)       Balance Overall balance assessment: No apparent balance deficits (not formally assessed)                                           Pertinent Vitals/Pain Pain Assessment: Faces Faces Pain Scale: Hurts little more Pain Location: L foot/ankle Pain Descriptors / Indicators: Throbbing Pain Intervention(s): Monitored during session    Home Living Family/patient expects to be discharged to:: Private residence Living Arrangements: Alone Available Help at Discharge: Friend(s);Family;Available PRN/intermittently Type of Home: Apartment Home Access: Stairs to enter Entrance Stairs-Rails: Right Entrance Stairs-Number of Steps: 12 Home Layout: One level Home Equipment: None      Prior Function Level of Independence: Independent               Hand Dominance        Extremity/Trunk Assessment   Upper  Extremity Assessment Upper Extremity Assessment: Overall WFL for tasks assessed    Lower Extremity Assessment Lower Extremity Assessment: LLE deficits/detail LLE Deficits / Details: Hip, knee WFL; taught pt isometrics, straight leg raises to mitigate teh effects of atrophy; Able to woggle toes minimally       Communication   Communication: No difficulties  Cognition Arousal/Alertness: Awake/alert Behavior During Therapy: WFL for tasks  assessed/performed Overall Cognitive Status: Within Functional Limits for tasks assessed                                        General Comments General comments (skin integrity, edema, etc.): Discussed monitoring for signs of compartment syndrome, exercises to mitigat effects of atrophy    Exercises Other Exercises Other Exercises: LLE straight leg raises Other Exercises: pillow squeeze Other Exercises: isometric hip abduction with strap around knees   Assessment/Plan    PT Assessment All further PT needs can be met in the next venue of care  PT Problem List Decreased strength;Decreased range of motion;Decreased activity tolerance;Decreased mobility;Decreased knowledge of precautions       PT Treatment Interventions      PT Goals (Current goals can be found in the Care Plan section)  Acute Rehab PT Goals Patient Stated Goal: Home to his puppy, Diesel PT Goal Formulation: All assessment and education complete, DC therapy    Frequency     Barriers to discharge        Co-evaluation               AM-PAC PT "6 Clicks" Mobility  Outcome Measure Help needed turning from your back to your side while in a flat bed without using bedrails?: None Help needed moving from lying on your back to sitting on the side of a flat bed without using bedrails?: None Help needed moving to and from a bed to a chair (including a wheelchair)?: None Help needed standing up from a chair using your arms (e.g., wheelchair or bedside chair)?: None Help needed to walk in hospital room?: A Little Help needed climbing 3-5 steps with a railing? : A Little 6 Click Score: 22    End of Session Equipment Utilized During Treatment: Gait belt Activity Tolerance: Patient tolerated treatment well Patient left: in bed;with call bell/phone within reach Nurse Communication: Mobility status PT Visit Diagnosis: Other abnormalities of gait and mobility (R26.89)    Time: 9021-1155 PT Time  Calculation (min) (ACUTE ONLY): 19 min   Charges:   PT Evaluation $PT Eval Low Complexity: [place], PT  Acute Rehabilitation Services Pager [phone] Office [phone]   Colletta [place] 10/03/2020, 11:37 AM

## 2020-10-03 NOTE — Progress Notes (Signed)
Patient refuse bed alarm and he keeps getting up OOB without calling for assistance.

## 2020-10-03 NOTE — ED Provider Notes (Signed)
Physical Exam  BP 111/70 (BP Location: Left Arm)   Pulse 68   Temp 97.6 F (36.4 C) (Oral)   Resp 16   Ht 6\' 1"  (1.854 m)   Wt 117 kg   SpO2 98%   BMI 34.04 kg/m   Physical Exam  ED Course/Procedures     .Sedation  Date/Time: 10/03/2020 8:01 AM Performed by: 10/05/2020, MD Authorized by: Jacalyn Lefevre, MD   Consent:    Consent obtained:  Verbal   Consent given by:  Patient   Risks discussed:  Allergic reaction, dysrhythmia, inadequate sedation, nausea, prolonged hypoxia resulting in organ damage, prolonged sedation necessitating reversal, respiratory compromise necessitating ventilatory assistance and intubation and vomiting   Alternatives discussed:  Analgesia without sedation, anxiolysis and regional anesthesia Universal protocol:    Procedure explained and questions answered to patient or proxy's satisfaction: yes     Relevant documents present and verified: yes     Test results available: yes     Imaging studies available: yes     Required blood products, implants, devices, and special equipment available: yes     Site/side marked: yes     Immediately prior to procedure, a time out was called: yes     Patient identity confirmed:  Verbally with patient Indications:    Procedure necessitating sedation performed by:  Physician performing sedation Pre-sedation assessment:    Time since last food or drink:  5   ASA classification: class 1 - normal, healthy patient     Mouth opening:  3 or more finger widths   Thyromental distance:  4 finger widths   Mallampati score:  I - soft palate, uvula, fauces, pillars visible   Neck mobility: normal     Pre-sedation assessments completed and reviewed: airway patency, cardiovascular function, hydration status, mental status, nausea/vomiting, pain level, respiratory function and temperature   Immediate pre-procedure details:    Reassessment: Patient reassessed immediately prior to procedure     Reviewed: vital signs,  relevant labs/tests and NPO status     Verified: bag valve mask available, emergency equipment available, intubation equipment available, IV patency confirmed, oxygen available and suction available   Procedure details (see MAR for exact dosages):    Preoxygenation:  Nasal cannula   Sedation:  Midazolam and etomidate   Intended level of sedation: deep   Analgesia:  Morphine   Intra-procedure monitoring:  Blood pressure monitoring, cardiac monitor, continuous pulse oximetry, frequent LOC assessments, frequent vital sign checks and continuous capnometry   Intra-procedure events: none     Total Provider sedation time (minutes):  30 Post-procedure details:    Attendance: Constant attendance by certified staff until patient recovered     Recovery: Patient returned to pre-procedure baseline     Post-sedation assessments completed and reviewed: airway patency, cardiovascular function, hydration status, mental status, nausea/vomiting, pain level, respiratory function and temperature     Patient is stable for discharge or admission: yes     Procedure completion:  Tolerated well, no immediate complications .Ortho Injury Treatment  Date/Time: 10/03/2020 8:02 AM Performed by: 10/05/2020, MD Authorized by: Jacalyn Lefevre, MD   Consent:    Consent obtained:  Verbal   Consent given by:  PatientInjury location: ankle Location details: right ankle Injury type: fracture-dislocation Fracture type: trimalleolar Pre-procedure neurovascular assessment: neurovascularly intact Pre-procedure distal perfusion: normal Pre-procedure neurological function: normal Pre-procedure range of motion: reduced  Anesthesia: Local anesthesia used: no  Patient sedated: Yes. Refer to sedation procedure documentation for details of sedation. Manipulation  performed: yes Skin traction used: no Skeletal traction used: no Reduction successful: yes X-ray confirmed reduction: yes Immobilization: splint Splint type:  short leg and sugar tong Supplies used: cotton padding and Ortho-Glass Post-procedure neurovascular assessment: post-procedure neurovascularly intact Post-procedure distal perfusion: normal Post-procedure neurological function: normal Post-procedure range of motion: normal Patient tolerance: patient tolerated the procedure well with no immediate complications     MDM         Jacalyn Lefevre, MD 10/03/20 [phone]

## 2020-10-03 NOTE — Progress Notes (Signed)
Physical Therapy Note   Eval complete (full note to follow)  Recommend crutches for amb;   Did quite well, including a flight of stairs;   OK for dc home from PT standpoint   Van Clines, PT  Acute Rehabilitation Services Pager [phone] Office [phone]

## 2020-10-03 NOTE — Progress Notes (Addendum)
Pt stated that he needed to leave hospital immediately as his ride would not wait for him to be discharged and did not have any other way to get home today, he stated that he needed to leave today due to home responsibilities. Nurse educated patient that he would be leaving AMA, and needs a follow up appointment w/ ortho. Pt signed AMA papers and educated on safety measures and need to make follow up appt. Pt wheeled down to lobby with crutches and belongings by staff. Vitals WNL, pt in no distress, no concerns noted at this time.

## 2020-10-03 NOTE — Discharge Summary (Signed)
Patient ID: DERELL BRUUN MRN: [id number] DOB/AGE: 1976-03-05 45 y.o.  Admit date: 10/02/2020 Discharge date:   Primary Diagnosis: Closed displaced tri-malleolar fracture of the left ankle Admission Diagnoses:  No past medical history on file. Discharge Diagnoses:   Active Problems:   Closed displaced trimalleolar fracture of left ankle  Estimated body mass index is 34.04 kg/m as calculated from the following:   Height as of this encounter: 6\' 1"  (1.854 m).   Weight as of this encounter: 117 kg.  Procedure:  Procedure(s) (LRB): OPEN REDUCTION INTERNAL FIXATION (ORIF) ANKLE FRACTURE (Left)   Consults: None  HPI: [name] is a 45 year old male that presented to the emergency room with a left ankle dislocation and trimalleolar fracture after he lost his balance and slipped and fell on an icy area on 10/02/2020.  Patient obvious deformity and dislocation in the emergency room imaging was obtained and this was promptly reduced.  Orthopedics was consulted and patient was determined to need operative fixation of his left trimalleolar fracture of the left ankle.  He underwent surgery by Dr. 10/04/2020 on 10/02/2020. Laboratory Data: Admission on 10/02/2020  Component Date Value Ref Range Status  . SARS Coronavirus 2 by RT PCR 10/02/2020 NEGATIVE  NEGATIVE Final   Comment: (NOTE) SARS-CoV-2 target nucleic acids are NOT DETECTED.  The SARS-CoV-2 RNA is generally detectable in upper respiratory specimens during the acute phase of infection. The lowest concentration of SARS-CoV-2 viral copies this assay can detect is 138 copies/mL. A negative result does not preclude SARS-Cov-2 infection and should not be used as the sole basis for treatment or other patient management decisions. A negative result may occur with  improper specimen collection/handling, submission of specimen other than nasopharyngeal swab, presence of viral mutation(s) within the areas targeted by this assay, and  inadequate number of viral copies(<138 copies/mL). A negative result must be combined with clinical observations, patient history, and epidemiological information. The expected result is Negative.  Fact Sheet for Patients:  10/04/2020  Fact Sheet for Healthcare Providers:  BloggerCourse.com  This test is no                          t yet approved or cleared by the [employer] FDA and  has been authorized for detection and/or diagnosis of SARS-CoV-2 by FDA under an Emergency Use Authorization (EUA). This EUA will remain  in effect (meaning this test can be used) for the duration of the COVID-19 declaration under Section 564(b)(1) of the Act, 21 U.S.C.section 360bbb-3(b)(1), unless the authorization is terminated  or revoked sooner.      . Influenza A by PCR 10/02/2020 NEGATIVE  NEGATIVE Final  . Influenza B by PCR 10/02/2020 NEGATIVE  NEGATIVE Final   Comment: (NOTE) The Xpert Xpress SARS-CoV-2/FLU/RSV plus assay is intended as an aid in the diagnosis of influenza from Nasopharyngeal swab specimens and should not be used as a sole basis for treatment. Nasal washings and aspirates are unacceptable for Xpert Xpress SARS-CoV-2/FLU/RSV testing.  Fact Sheet for Patients: 10/04/2020  Fact Sheet for Healthcare Providers: BloggerCourse.com  This test is not yet approved or cleared by the [employer] FDA and has been authorized for detection and/or diagnosis of SARS-CoV-2 by FDA under an Emergency Use Authorization (EUA). This EUA will remain in effect (meaning this test can be used) for the duration of the COVID-19 declaration under Section 564(b)(1) of the Act, 21 U.S.C. section 360bbb-3(b)(1), unless the authorization is terminated or revoked.  Performed at [facility], [address], [place], [place] [address]      X-Rays:DG Ankle 2 Views Left  Result  Date: 10/02/2020 CLINICAL DATA:  Distal tibial and fibular fractures EXAM: DG C-ARM 1-60 MIN; LEFT ANKLE - 2 VIEW COMPARISON:  10/02/2020 FLUOROSCOPY TIME:  Fluoroscopy Time:  1 minutes 22 seconds Radiation Exposure Index (if provided by the fluoroscopic device): 2.32 mGy Number of Acquired Spot Images: 7 FINDINGS: Fixation sideplate is noted along the posterior aspect of the distal tibia. Additional fixation screws noted traversing the posterior malleolus. Fracture fragments have been reduced. Fixation rod is noted within the medullary cavity of the distal fibula with fixation screw distally. Additionally a fixator traversing the distal tibia and fibula is seen. Fracture fragment is noted anterior to the tibiotalar joint. The medial malleolar fracture remains. IMPRESSION: ORIF of distal tibial and fibular fractures as described. Electronically Signed   By: Alcide Clever M.D.   On: 10/02/2020 20:18   DG Ankle Complete Left  Result Date: 10/02/2020 CLINICAL DATA:  Slipped on ice this morning, injury, deformity, M21.962 EXAM: LEFT ANKLE COMPLETE - 3+ VIEW COMPARISON:  08/06/2008 FINDINGS: Osseous mineralization normal. Fracture dislocation LEFT ankle. Transverse fracture medial malleolus displaced laterally. Lateral dislocation of talus at ankle joint. Displaced posterior malleolar fracture fragment. Oblique fracture of distal LEFT fibular diaphysis, comminuted, with apex medial and anterior angulation. Small non fused ossicle at tip of lateral malleolus. Small Achilles insertion calcaneal spur. IMPRESSION: Displaced fractures of distal LEFT fibular diaphysis, medial malleolus and posterior malleolus with lateral dislocation of talus at LEFT ankle joint. Electronically Signed   By: Ulyses Southward M.D.   On: 10/02/2020 09:56   CT ANKLE LEFT WO CONTRAST  Result Date: 10/02/2020 CLINICAL DATA:  Fall on ice, ankle fracture EXAM: CT OF THE LEFT ANKLE WITHOUT CONTRAST TECHNIQUE: Multidetector CT imaging of the left  ankle was performed according to the standard protocol. Multiplanar CT image reconstructions were also generated. COMPARISON:  Radiographs from 10/02/2020 FINDINGS: Bones/Joint/Cartilage Stage 4 Weber C fracture of the left ankle noted including transverse fracture the medial malleolus, oblique fracture of the posterior malleolus, and a fibular fracture above the level of the syndesmosis. The fibular fracture has some mild comminution including an oblique component as well as the transverse component. On image 68 of series 3 we demonstrate a 0.7 by 0.7 by 0.9 cm bony fragment along the anterior tibiofibular articulation as well as a couple of other small bony fragments particularly along the posterior malleolar fracture plane. Posterior malleolar fracture is distracted up to 1.3 cm laterally. Small corticated ossific structures along the inferior tip of the fibula. The long axis of the shaft of the tibia is dislocated about 1.2 cm anterior and about 0.7 cm medial with respect to the talus. Plantar and Achilles calcaneal spurs noted. Ligaments Suboptimally assessed by CT. Muscles and Tendons No flexor tendon entrapment. Trace amount of gas adjacent to the extensor digitorum longus tendon on image 69 of series 4. Soft tissues Expected subcutaneous edema/hematoma medially, laterally, and dorsally along the ankle. IMPRESSION: 1. Stage 4 Weber C fracture of the left ankle including transverse fracture the medial malleolus, oblique fracture of the posterior malleolus, and a fibular fracture above the level of the syndesmosis. The long axis of the shaft of the tibia is dislocated about 1.2 cm anterior and medial with respect to the talus. 2. Trace amount of gas adjacent to the extensor digitorum longus tendon. 3. Expected subcutaneous edema/hematoma medially, laterally, and  dorsally along the ankle. 4. Plantar and Achilles calcaneal spurs. Electronically Signed   By: Gaylyn Rong M.D.   On: 10/02/2020 13:50   DG  Ankle Left Port  Result Date: 10/02/2020 CLINICAL DATA:  Post reduction left ankle. EXAM: PORTABLE LEFT ANKLE - 2 VIEW COMPARISON:  10/02/2020. FINDINGS: Post reduction trimalleolar fracture/dislocation. Improved anatomic alignment. Persistent displaced medial malleolar fracture fragments. Lateral subluxation of the talus. IMPRESSION: Post reduction trimalleolar fracture/dislocation with improved anatomic alignment. Persistent displaced medial malleolar fracture fragments. Lateral subluxation of the talus. Electronically Signed   By: Maisie Fus  Register   On: 10/02/2020 11:09   DG C-Arm 1-60 Min  Result Date: 10/02/2020 CLINICAL DATA:  Distal tibial and fibular fractures EXAM: DG C-ARM 1-60 MIN; LEFT ANKLE - 2 VIEW COMPARISON:  10/02/2020 FLUOROSCOPY TIME:  Fluoroscopy Time:  1 minutes 22 seconds Radiation Exposure Index (if provided by the fluoroscopic device): 2.32 mGy Number of Acquired Spot Images: 7 FINDINGS: Fixation sideplate is noted along the posterior aspect of the distal tibia. Additional fixation screws noted traversing the posterior malleolus. Fracture fragments have been reduced. Fixation rod is noted within the medullary cavity of the distal fibula with fixation screw distally. Additionally a fixator traversing the distal tibia and fibula is seen. Fracture fragment is noted anterior to the tibiotalar joint. The medial malleolar fracture remains. IMPRESSION: ORIF of distal tibial and fibular fractures as described. Electronically Signed   By: Alcide Clever M.D.   On: 10/02/2020 20:18    EKG:No orders found for this or any previous visit.   Hospital Course: [name] is a 45 y.o. who was admitted to Hospital. They were brought to the operating room on 10/02/2020 and underwent Procedure(s): OPEN REDUCTION INTERNAL FIXATION (ORIF) ANKLE FRACTURE.  Patient tolerated the procedure well and was later transferred to the recovery room and then to the orthopaedic floor for postoperative care.   They were given PO and IV analgesics for pain control following their surgery.  They were given 24 hours of postoperative antibiotics of  Anti-infectives (From admission, onward)   Start     Dose/Rate Route Frequency Ordered Stop   10/03/20 0600  ceFAZolin (ANCEF) IVPB 2g/100 mL premix        2 g 200 mL/hr over 30 Minutes Intravenous On call to O.R. 10/02/20 1451 10/02/20 1555   10/03/20 0000  ceFAZolin (ANCEF) IVPB 1 g/50 mL premix        1 g 100 mL/hr over 30 Minutes Intravenous Every 6 hours 10/02/20 1958 10/03/20 1759     and started on DVT prophylaxis in the form of Aspirin.   PT was ordered for safe ambulation.  Discharge planning consulted to help with postop disposition and equipment needs.  Patient had an uneventful night on the evening of surgery.  They started to get up OOB with therapy on day one. Patient did well with PT .  Patient was seen in rounds and was ready to go home.Oxycodone sent to pharmacy. Patient to take aspirin for DVT prophylaxis.  Patient left AMA due to his ride being here and was not evaluated by orthopedics prior to leaving. Medications are at his pharmacy.   He will remain NWB to the LLE. He will return to the clinic in 2 weeks for suture removal, xrays, and likely transition to a CAM boot.    Diet: Regular diet Activity:NWB LLE Follow-up:in 2 weeks Disposition - Home Discharged Condition: good    Allergies as of 10/03/2020  Reactions   Tomato Nausea And Vomiting      Medication List    TAKE these medications   ibuprofen 200 MG tablet Commonly known as: ADVIL Take 200 mg by mouth every 6 (six) hours as needed for headache.   ondansetron 4 MG disintegrating tablet Commonly known as: Zofran ODT Take 1 tablet (4 mg total) by mouth every 8 (eight) hours as needed.   oxyCODONE 5 MG immediate release tablet Commonly known as: Roxicodone Take 1 tablet (5 mg total) by mouth every 8 (eight) hours as needed.            Durable Medical  Equipment  (From admission, onward)         Start     Ordered   10/03/20 1254  For home use only DME Crutches  Once        10/03/20 1253   10/03/20 1104  For home use only DME 3 n 1  Once        10/03/20 1104          Follow-up Information    Yolonda Kidaogers, Rocio Patrick, MD In 2 weeks.   Specialty: Orthopedic Surgery Why: For suture removal, For wound re-check Contact information: [address] [place] [address] [phone]               Signed: Arbie CookeyKevan D Keyshla Tunison  Orthopaedic Surgery 10/03/2020, 2:50 PM

## 2020-10-03 NOTE — Progress Notes (Signed)
Orthopedic Tech Progress Note Patient Details:  [name] 1976-03-12 [id number]  Ortho Devices Type of Ortho Device: Crutches Ortho Device/Splint Location: LLE Ortho Device/Splint Interventions: Application,Adjustment   Post Interventions Patient Tolerated: Well,Ambulated well Instructions Provided: Poper ambulation with device,Care of device,Adjustment of device   Donald Pore 10/03/2020, 3:43 PM

## 2020-12-19 ENCOUNTER — Other Ambulatory Visit: Payer: Self-pay

## 2020-12-19 ENCOUNTER — Encounter: Payer: Worker's Compensation | Attending: Internal Medicine | Admitting: Internal Medicine

## 2020-12-19 DIAGNOSIS — T8131XD Disruption of external operation (surgical) wound, not elsewhere classified, subsequent encounter: Secondary | ICD-10-CM | POA: Diagnosis not present

## 2020-12-19 DIAGNOSIS — Y9301 Activity, walking, marching and hiking: Secondary | ICD-10-CM | POA: Insufficient documentation

## 2020-12-19 DIAGNOSIS — L97328 Non-pressure chronic ulcer of left ankle with other specified severity: Secondary | ICD-10-CM | POA: Insufficient documentation

## 2020-12-19 DIAGNOSIS — I10 Essential (primary) hypertension: Secondary | ICD-10-CM | POA: Diagnosis not present

## 2020-12-19 NOTE — Progress Notes (Signed)
DOMANI, BAKOS ([id number]) Visit Report for 12/19/2020 Abuse/Suicide Risk Screen Details Patient Name: [name], [name] Date of Service: 12/19/2020 9:30 AM Medical Record Number: [id number] Patient Account Number: [id number] Date of Birth/Sex: 25-Sep-1975 (45 y.o. Male) Treating RN: Hansel Feinstein Primary Care Armanii Pressnell: SYSTEM, PCP Other Clinician: Lolita Cram Referring Malgorzata Albert: Duwayne Heck Treating Salem Mastrogiovanni/Extender: Maxwell Caul Weeks in Treatment: 0 Abuse/Suicide Risk Screen Items Answer ABUSE RISK SCREEN: Has anyone close to you tried to hurt or harm you recentlyo No Do you feel uncomfortable with anyone in your familyo No Has anyone forced you do things that you didnot want to doo No Electronic Signature(s) Signed: 12/19/2020 4:51:02 PM By: Hansel Feinstein Entered By: Hansel Feinstein on 12/19/2020 09:44:23 Housey, Kairi Elvera Lennox ([id number]) -------------------------------------------------------------------------------- Activities of Daily Living Details Patient Name: [name] Date of Service: 12/19/2020 9:30 AM Medical Record Number: [id number] Patient Account Number: [id number] Date of Birth/Sex: 01/28/1976 (45 y.o. Male) Treating RN: Hansel Feinstein Primary Care Akil Hoos: SYSTEM, PCP Other Clinician: Lolita Cram Referring Soham Hollett: Duwayne Heck Treating Shem Plemmons/Extender: Maxwell Caul Weeks in Treatment: 0 Activities of Daily Living Items Answer Activities of Daily Living (Please select one for each item) Drive Automobile Completely Able Take Medications Completely Able Use Telephone Completely Able Care for Appearance Completely Able Use Toilet Completely Able Bath / Shower Completely Able Dress Self Completely Able Feed Self Completely Able Walk Completely Able Get In / Out Bed Completely Able Housework Completely Able Prepare Meals Completely Able Handle Money Completely Able Shop for Self Completely Able Electronic Signature(s) Signed:  12/19/2020 4:51:02 PM By: Hansel Feinstein Entered By: Hansel Feinstein on 12/19/2020 09:44:47 Vegh, Cecille Aver ([id number]) -------------------------------------------------------------------------------- Education Screening Details Patient Name: [name] Date of Service: 12/19/2020 9:30 AM Medical Record Number: [id number] Patient Account Number: [id number] Date of Birth/Sex: 1976/05/14 (45 y.o. Male) Treating RN: Hansel Feinstein Primary Care Munachimso Palin: SYSTEM, PCP Other Clinician: Lolita Cram Referring Donnovan Stamour: Duwayne Heck Treating Aubrianna Orchard/Extender: Altamese Benton in Treatment: 0 Learning Preferences/Education Level/Primary Language Learning Preference: Explanation, Demonstration Highest Education Level: College or Above Preferred Language: English Cognitive Barrier Language Barrier: No Translator Needed: No Memory Deficit: No Emotional Barrier: No Cultural/Religious Beliefs Affecting Medical Care: No Physical Barrier Impaired Vision: No Impaired Hearing: No Decreased Hand dexterity: No Knowledge/Comprehension Knowledge Level: High Comprehension Level: High Ability to understand written instructions: High Ability to understand verbal instructions: High Motivation Anxiety Level: Calm Cooperation: Cooperative Education Importance: Acknowledges Need Interest in Health Problems: Asks Questions Perception: Coherent Willingness to Engage in Self-Management High Activities: Readiness to Engage in Self-Management High Activities: Electronic Signature(s) Signed: 12/19/2020 4:51:02 PM By: Hansel Feinstein Entered By: Hansel Feinstein on 12/19/2020 09:45:25 Formoso, Cecille Aver ([id number]) -------------------------------------------------------------------------------- Fall Risk Assessment Details Patient Name: [name] Date of Service: 12/19/2020 9:30 AM Medical Record Number: [id number] Patient Account Number: [id number] Date of Birth/Sex: 01-11-1976 (45 y.o.  Male) Treating RN: Hansel Feinstein Primary Care Tedrick Port: SYSTEM, PCP Other Clinician: Lolita Cram Referring Jamespaul Secrist: Duwayne Heck Treating Arlo Butt/Extender: Maxwell Caul Weeks in Treatment: 0 Fall Risk Assessment Items Have you had 2 or more falls in the last 12 monthso 0 Yes Have you had any fall that resulted in injury in the last 12 monthso 0 Yes FALLS RISK SCREEN History of falling - immediate or within 3 months 25 Yes Secondary diagnosis (Do you have 2 or more medical diagnoseso) 0 No Ambulatory aid None/bed rest/wheelchair/nurse 0 No Crutches/cane/walker 15 Yes Furniture 0 No Intravenous therapy Access/Saline/Heparin Lock 0 No Gait/Transferring Normal/ bed rest/ wheelchair  0 No Weak (short steps with or without shuffle, stooped but able to lift head while walking, may 0 No seek support from furniture) Impaired (short steps with shuffle, may have difficulty arising from chair, head down, impaired 20 Yes balance) Mental Status Oriented to own ability 0 Yes Notes walks with crutches post op and post falls Electronic Signature(s) Signed: 12/19/2020 4:51:02 PM By: Hansel Feinstein Entered By: Hansel Feinstein on 12/19/2020 09:46:23 Bierly, Jacen Elvera Lennox ([id number]) -------------------------------------------------------------------------------- Foot Assessment Details Patient Name: [name] Date of Service: 12/19/2020 9:30 AM Medical Record Number: [id number] Patient Account Number: [id number] Date of Birth/Sex: 1976-07-01 (45 y.o. Male) Treating RN: Hansel Feinstein Primary Care Dawsyn Zurn: SYSTEM, PCP Other Clinician: Lolita Cram Referring Marvel Mcphillips: Duwayne Heck Treating Zennie Ayars/Extender: Maxwell Caul Weeks in Treatment: 0 Foot Assessment Items Site Locations + = Sensation present, - = Sensation absent, C = Callus, U = Ulcer R = Redness, W = Warmth, M = Maceration, PU = Pre-ulcerative lesion F = Fissure, S = Swelling, D = Dryness Assessment Right:  Left: Other Deformity: No No Prior Foot Ulcer: No No Prior Amputation: No No Charcot Joint: No No Ambulatory Status: Ambulatory Without Help Gait: Unsteady Notes USES CRUTCHES POST OP Electronic Signature(s) Signed: 12/19/2020 4:51:02 PM By: Hansel Feinstein Entered By: Hansel Feinstein on 12/19/2020 10:01:57 Urton, Cecille Aver ([id number]) -------------------------------------------------------------------------------- Nutrition Risk Screening Details Patient Name: [name] Date of Service: 12/19/2020 9:30 AM Medical Record Number: [id number] Patient Account Number: [id number] Date of Birth/Sex: Jan 31, 1976 (45 y.o. Male) Treating RN: Hansel Feinstein Primary Care Samariyah Cowles: SYSTEM, PCP Other Clinician: Lolita Cram Referring Louisa Favaro: Duwayne Heck Treating Clemmie Buelna/Extender: Maxwell Caul Weeks in Treatment: 0 Height (in): Weight (lbs): Body Mass Index (BMI): Nutrition Risk Screening Items Score Screening NUTRITION RISK SCREEN: I have an illness or condition that made me change the kind and/or amount of food I eat 0 No I eat fewer than two meals per day 0 No I eat few fruits and vegetables, or milk products 0 No I have three or more drinks of beer, liquor or wine almost every day 0 No I have tooth or mouth problems that make it hard for me to eat 0 No I don't always have enough money to buy the food I need 0 No I eat alone most of the time 1 Yes I take three or more different prescribed or over-the-counter drugs a day 0 No Without wanting to, I have lost or gained 10 pounds in the last six months 0 No I am not always physically able to shop, cook and/or feed myself 0 No Nutrition Protocols Good Risk Protocol Moderate Risk Protocol High Risk Proctocol Risk Level: Good Risk Score: 1 Electronic Signature(s) Signed: 12/19/2020 4:51:02 PM By: Hansel Feinstein Entered ByHansel Feinstein on 12/19/2020 09:47:12

## 2020-12-20 NOTE — Progress Notes (Signed)
[name], [name] ([id number]) Visit Report for 12/19/2020 Chief Complaint Document Details Patient Name: Calvin, Marshall. Date of Service: 12/19/2020 9:30 AM Medical Record Number: [id number] Patient Account Number: [id number] Date of Birth/Sex: 04/12/1976 (45 y.o. Male) Treating RN: Huel Coventry Primary Care Provider: SYSTEM, PCP Other Clinician: Lolita Cram Referring Provider: Duwayne Heck Treating Provider/Extender: Maxwell Caul Weeks in Treatment: 0 Information Obtained from: Patient Chief Complaint 12/19/2020; patient is here for review of 3 separate wound areas on the lateral aspect of his left ankle after an ORIF in January/22 Electronic Signature(s) Signed: 12/20/2020 7:59:07 AM By: Baltazar Najjar MD Entered By: Baltazar Najjar on 12/19/2020 10:27:54 Gedney, [name] ([id number]) -------------------------------------------------------------------------------- Debridement Details Patient Name: [name] Date of Service: 12/19/2020 9:30 AM Medical Record Number: [id number] Patient Account Number: [id number] Date of Birth/Sex: [birth year] (45 y.o. Male) Treating RN: Huel Coventry Primary Care Provider: SYSTEM, PCP Other Clinician: Lolita Cram Referring Provider: Duwayne Heck Treating Provider/Extender: Altamese [place] in Treatment: 0 Debridement Performed for Wound #2 Left,Distal Malleolus Assessment: Performed By: Physician Maxwell Caul, MD Debridement Type: Debridement Level of Consciousness (Pre- Awake and Alert procedure): Pre-procedure Verification/Time Out Yes - 10:13 Taken: Total Area Debrided (L x W): 10 (cm) x 0.5 (cm) = 5 (cm) Tissue and other material Viable, Non-Viable, Slough, Subcutaneous, Slough debrided: Level: Skin/Subcutaneous Tissue Debridement Description: Excisional Instrument: Curette Bleeding: Minimum Hemostasis Achieved: Pressure Response to Treatment: Procedure was tolerated well Level of Consciousness  (Post- Awake and Alert procedure): Post Debridement Measurements of Total Wound Length: (cm) 10 Width: (cm) 0.5 Depth: (cm) 0.1 Volume: (cm) 0.393 Character of Wound/Ulcer Post Debridement: Stable Post Procedure Diagnosis Same as Pre-procedure Electronic Signature(s) Signed: 12/19/2020 5:25:07 PM By: Elliot Gurney, BSN, RN, CWS, Kim RN, BSN Signed: 12/20/2020 7:59:07 AM By: Baltazar Najjar MD Entered By: Baltazar Najjar on 12/19/2020 10:27:01 [name], [name] ([id number]) -------------------------------------------------------------------------------- Debridement Details Patient Name: [name] Date of Service: 12/19/2020 9:30 AM Medical Record Number: [id number] Patient Account Number: [id number] Date of Birth/Sex: April 07, 1976 (45 y.o. Male) Treating RN: Huel Coventry Primary Care Provider: SYSTEM, PCP Other Clinician: Lolita Cram Referring Provider: Duwayne Heck Treating Provider/Extender: Altamese [place] in Treatment: 0 Debridement Performed for Wound #3 Left,Posterior Malleolus Assessment: Performed By: Physician Maxwell Caul, MD Debridement Type: Debridement Level of Consciousness (Pre- Awake and Alert procedure): Pre-procedure Verification/Time Out Yes - 10:13 Taken: Total Area Debrided (L x W): 1.5 (cm) x 0.6 (cm) = 0.9 (cm) Tissue and other material Viable, Non-Viable, Slough, Subcutaneous, Slough debrided: Level: Skin/Subcutaneous Tissue Debridement Description: Excisional Instrument: Curette Bleeding: Minimum Hemostasis Achieved: Pressure Response to Treatment: Procedure was tolerated well Level of Consciousness (Post- Awake and Alert procedure): Post Debridement Measurements of Total Wound Length: (cm) 1.5 Width: (cm) 0.6 Depth: (cm) 0.1 Volume: (cm) 0.071 Character of Wound/Ulcer Post Debridement: Stable Post Procedure Diagnosis Same as Pre-procedure Electronic Signature(s) Signed: 12/19/2020 5:25:07 PM By: Elliot Gurney, BSN, RN, CWS, Kim  RN, BSN Signed: 12/20/2020 7:59:07 AM By: Baltazar Najjar MD Entered By: Baltazar Najjar on 12/19/2020 10:27:12 [name], [name] ([id number]) -------------------------------------------------------------------------------- HPI Details Patient Name: [name] Date of Service: 12/19/2020 9:30 AM Medical Record Number: [id number] Patient Account Number: [id number] Date of Birth/Sex: [birth year] (45 y.o. Male) Treating RN: Huel Coventry Primary Care Provider: SYSTEM, PCP Other Clinician: Lolita Cram Referring Provider: Duwayne Heck Treating Provider/Extender: Maxwell Caul Weeks in Treatment: 0 History of Present Illness HPI Description: ADMISSION 12/19/2020 This is a 45 year old otherwise healthy man who had a fall at work suffering  a left ankle fracture on 10/02/2020. He required operative correction with pinning. I am not exactly sure how he did in the interim how he had however he had another fall apparently in late October falling down some stairs and that is when he describes 3 separate areas opening. He is followed with Dr. Aundria Rud at [facility]. I really cannot get a sense exactly how these closed however he is simply been putting dry gauze on this and an Ace wrap. I guess after the second fall he had a lot of swelling and Dr. Aundria Rud has put him on 2 empiric trials of Keflex. Things seem to be settling down more. He is not a diabetic does not have known arterial disease. He has had previous knee surgery. ABI in our clinic was 1.19 on the left Electronic Signature(s) Signed: 12/20/2020 7:59:07 AM By: Baltazar Najjar MD Entered By: Baltazar Najjar on 12/19/2020 10:29:32 [name], [name] ([id number]) -------------------------------------------------------------------------------- Gaynelle Adu TISS Details Patient Name: [name] Date of Service: 12/19/2020 9:30 AM Medical Record Number: [id number] Patient Account Number: [id number] Date of Birth/Sex:  07/29/1976 (45 y.o. Male) Treating RN: Huel Coventry Primary Care Provider: SYSTEM, PCP Other Clinician: Lolita Cram Referring Provider: Duwayne Heck Treating Provider/Extender: Altamese [place] in Treatment: 0 Procedure Performed for: Wound #1 Left,Proximal Malleolus Performed By: Physician Maxwell Caul, MD Post Procedure Diagnosis Same as Pre-procedure Notes 2 silver nitrate sticks used Electronic Signature(s) Signed: 12/20/2020 7:59:07 AM By: Baltazar Najjar MD Entered By: Baltazar Najjar on 12/19/2020 10:26:52 [name], [name] ([id number]) -------------------------------------------------------------------------------- Physical Exam Details Patient Name: [name] Date of Service: 12/19/2020 9:30 AM Medical Record Number: [id number] Patient Account Number: [id number] Date of Birth/Sex: Jan 24, 1976 (45 y.o. Male) Treating RN: Huel Coventry Primary Care Provider: SYSTEM, PCP Other Clinician: Lolita Cram Referring Provider: Duwayne Heck Treating Provider/Extender: Maxwell Caul Weeks in Treatment: 0 Constitutional Sitting or standing Blood Pressure is within target range for patient.. Pulse regular and within target range for patient.Marland Kitchen Respirations regular, non- labored and within target range.. Temperature is normal and within the target range for the patient.Marland Kitchen appears in no distress. Cardiovascular Pedal pulses are robust. There is edema around the wound. Notes Wound exam; there is swelling over the lateral malleolus on the left. But no erythema. He has 3 separate small areas 1 is in the incision line posteriorly I used a #3 curette to clean up the surface and circumference of this removing subcutaneous tissue and eschar around the circumference oMore anteriorly he has a distal area which also looks like a small benign wound raised edges again I cleaned this up with a #3 curette a special attention to the edges removing skin and subcutaneous  tissue oThe more superior area anteriorly is hyper granulated slightly ballotable. I was not sure what to make of this. He is not tender there is no purulence. I used silver nitrate to pack this down a bit to the level of the skin Electronic Signature(s) Signed: 12/20/2020 7:59:07 AM By: Baltazar Najjar MD Entered By: Baltazar Najjar on 12/19/2020 10:31:07 [name], [name] ([id number]) -------------------------------------------------------------------------------- Physician Orders Details Patient Name: [name] Date of Service: 12/19/2020 9:30 AM Medical Record Number: [id number] Patient Account Number: [id number] Date of Birth/Sex: [birth year] (45 y.o. Male) Treating RN: Huel Coventry Primary Care Provider: SYSTEM, PCP Other Clinician: Lolita Cram Referring Provider: Duwayne Heck Treating Provider/Extender: Altamese [place] in Treatment: 0 Verbal / Phone Orders: No Diagnosis Coding Follow-up Appointments o Return Appointment in 1 week. Bathing/  Shower/ Hygiene o May shower with wound dressing protected with water repellent cover or cast protector. Edema Control - Lymphedema / Segmental Compressive Device / Other o Optional: One layer of unna paste to top of compression wrap (to act as an anchor). o Elevate, Exercise Daily and Avoid Standing for Long Periods of Time. o Elevate legs to the level of the heart and pump ankles as often as possible o Elevate leg(s) parallel to the floor when sitting. Wound Treatment Wound #1 - Malleolus Wound Laterality: Left, Proximal Primary Dressing: Hydrofera Blue Ready Transfer Foam, 2.5x2.5 (in/in) Discharge Instructions: Apply Hydrofera Blue Ready to wound bed as directed Secondary Dressing: ABD Pad 5x9 (in/in) Discharge Instructions: Cover with ABD pad Compression Wrap: Profore Lite LF 3 Multilayer Compression Bandaging System Discharge Instructions: Apply 3 multi-layer wrap as prescribed. Wound #2 - Malleolus  Wound Laterality: Left, Distal Primary Dressing: Hydrofera Blue Ready Transfer Foam, 2.5x2.5 (in/in) Discharge Instructions: Apply Hydrofera Blue Ready to wound bed as directed Secondary Dressing: ABD Pad 5x9 (in/in) Discharge Instructions: Cover with ABD pad Compression Wrap: Profore Lite LF 3 Multilayer Compression Bandaging System Discharge Instructions: Apply 3 multi-layer wrap as prescribed. Wound #3 - Malleolus Wound Laterality: Left, Posterior Primary Dressing: Hydrofera Blue Ready Transfer Foam, 2.5x2.5 (in/in) Discharge Instructions: Apply Hydrofera Blue Ready to wound bed as directed Secondary Dressing: ABD Pad 5x9 (in/in) Discharge Instructions: Cover with ABD pad Compression Wrap: Profore Lite LF 3 Multilayer Compression Bandaging System Discharge Instructions: Apply 3 multi-layer wrap as prescribed. Electronic Signature(s) Signed: 12/19/2020 5:25:07 PM By: Elliot Gurney, BSN, RN, CWS, Kim RN, BSN Signed: 12/20/2020 7:59:07 AM By: Baltazar Najjar MD Entered By: Elliot Gurney, BSN, RN, CWS, Kim on 12/19/2020 10:23:03 [name], [name] ([id number]) -------------------------------------------------------------------------------- Problem List Details Patient Name: BRANSON, KRANZ. Date of Service: 12/19/2020 9:30 AM Medical Record Number: [id number] Patient Account Number: [id number] Date of Birth/Sex: Jul 30, 1976 (45 y.o. Male) Treating RN: Huel Coventry Primary Care Provider: SYSTEM, PCP Other Clinician: Lolita Cram Referring Provider: Duwayne Heck Treating Provider/Extender: Maxwell Caul Weeks in Treatment: 0 Active Problems ICD-10 Encounter Code Description Active Date MDM Diagnosis L97.328 Non-pressure chronic ulcer of left ankle with other specified severity 12/19/2020 No Yes T81.31XD Disruption of external operation (surgical) wound, not elsewhere 12/19/2020 No Yes classified, subsequent encounter Inactive Problems Resolved Problems Electronic Signature(s) Signed: 12/20/2020  7:59:07 AM By: Baltazar Najjar MD Entered By: Baltazar Najjar on 12/19/2020 10:24:37 [name], [name] ([id number]) -------------------------------------------------------------------------------- Progress Note Details Patient Name: [name] Date of Service: 12/19/2020 9:30 AM Medical Record Number: [id number] Patient Account Number: [id number] Date of Birth/Sex: 07-08-1976 (45 y.o. Male) Treating RN: Huel Coventry Primary Care Provider: SYSTEM, PCP Other Clinician: Lolita Cram Referring Provider: Duwayne Heck Treating Provider/Extender: Maxwell Caul Weeks in Treatment: 0 Subjective Chief Complaint Information obtained from Patient 12/19/2020; patient is here for review of 3 separate wound areas on the lateral aspect of his left ankle after an ORIF in January/22 History of Present Illness (HPI) ADMISSION 12/19/2020 This is a 45 year old otherwise healthy man who had a fall at work suffering a left ankle fracture on 10/02/2020. He required operative correction with pinning. I am not exactly sure how he did in the interim how he had however he had another fall apparently in late October falling down some stairs and that is when he describes 3 separate areas opening. He is followed with Dr. Aundria Rud at [facility]. I really cannot get a sense exactly how these closed however he is simply been putting dry gauze on this and  an Ace wrap. I guess after the second fall he had a lot of swelling and Dr. Aundria Rud has put him on 2 empiric trials of Keflex. Things seem to be settling down more. He is not a diabetic does not have known arterial disease. He has had previous knee surgery. ABI in our clinic was 1.19 on the left Patient History Information obtained from Patient. Allergies No Known Drug Allergies Family History Heart Disease - Father, No family history of Cancer, Diabetes, Hypertension, Kidney Disease, Lung Disease, Seizures, Stroke, Thyroid Problems, Tuberculosis. Social  History Former smoker - ended on 09/05/2020, Marital Status - Divorced, Alcohol Use - Rarely, Drug Use - No History, Caffeine Use - Daily. Medical History Hospitalization/Surgery History - hernia repair. Review of Systems (ROS) Constitutional Symptoms (General Health) Denies complaints or symptoms of Fatigue, Fever, Chills, Marked Weight Change. Eyes Denies complaints or symptoms of Dry Eyes, Vision Changes, Glasses / Contacts. Ear/Nose/Mouth/Throat Denies complaints or symptoms of Difficult clearing ears, Sinusitis. Hematologic/Lymphatic Denies complaints or symptoms of Bleeding / Clotting Disorders, Human Immunodeficiency Virus. Respiratory Denies complaints or symptoms of Chronic or frequent coughs, Shortness of Breath. Cardiovascular Denies complaints or symptoms of Chest pain, LE edema. Gastrointestinal Denies complaints or symptoms of Frequent diarrhea, Nausea, Vomiting. Endocrine Denies complaints or symptoms of Hepatitis, Thyroid disease, Polydypsia (Excessive Thirst). Genitourinary Denies complaints or symptoms of Kidney failure/ Dialysis, Incontinence/dribbling. Immunological Denies complaints or symptoms of Hives, Itching. Integumentary (Skin) Denies complaints or symptoms of Wounds, Bleeding or bruising tendency, Breakdown, Swelling. Musculoskeletal Denies complaints or symptoms of Muscle Pain, Muscle Weakness. Neurologic Denies complaints or symptoms of Numbness/parasthesias, Focal/Weakness. Psychiatric Denies complaints or symptoms of Anxiety, Claustrophobia. ALMUS, WOODHAM ([id number]) Objective Constitutional Sitting or standing Blood Pressure is within target range for patient.. Pulse regular and within target range for patient.Marland Kitchen Respirations regular, non- labored and within target range.. Temperature is normal and within the target range for the patient.Marland Kitchen appears in no distress. Vitals Time Taken: 9:40 AM, Height: 73 in, Weight: 260 lbs, BMI: 34.3,  Temperature: 98.1 F, Pulse: 89 bpm, Respiratory Rate: 18 breaths/min, Blood Pressure: 124/78 mmHg. Cardiovascular Pedal pulses are robust. There is edema around the wound. General Notes: Wound exam; there is swelling over the lateral malleolus on the left. But no erythema. He has 3 separate small areas 1 is in the incision line posteriorly I used a #3 curette to clean up the surface and circumference of this removing subcutaneous tissue and eschar around the circumference More anteriorly he has a distal area which also looks like a small benign wound raised edges again I cleaned this up with a #3 curette a special attention to the edges removing skin and subcutaneous tissue The more superior area anteriorly is hyper granulated slightly ballotable. I was not sure what to make of this. He is not tender there is no purulence. I used silver nitrate to pack this down a bit to the level of the skin Integumentary (Hair, Skin) Wound #1 status is Open. Original cause of wound was Trauma. The date acquired was: 11/19/2020. The wound is located on the Left,Proximal Malleolus. The wound measures 1cm length x 0.7cm width x 0.1cm depth; 0.55cm^2 area and 0.055cm^3 volume. There is Fat Layer (Subcutaneous Tissue) exposed. There is no tunneling or undermining noted. There is large (67-100%) red, pink granulation within the wound bed. There is no necrotic tissue within the wound bed. Wound #2 status is Open. Original cause of wound was Trauma. The date acquired was: 11/19/2020. The wound is located  on the Left,Distal Malleolus. The wound measures 10cm length x 0.5cm width x 0.1cm depth; 3.927cm^2 area and 0.393cm^3 volume. There is Fat Layer (Subcutaneous Tissue) exposed. There is no tunneling or undermining noted. There is a small amount of sanguinous drainage noted. There is medium (34-66%) red, pink granulation within the wound bed. There is a small (1-33%) amount of necrotic tissue within the wound bed  including Adherent Slough. Wound #3 status is Open. Original cause of wound was Trauma. The date acquired was: 11/19/2020. The wound is located on the Left,Posterior Malleolus. The wound measures 1.5cm length x 0.6cm width x 0.1cm depth; 0.707cm^2 area and 0.071cm^3 volume. There is Fat Layer (Subcutaneous Tissue) exposed. There is no tunneling or undermining noted. There is a small amount of serous drainage noted. There is medium (34-66%) red, pink granulation within the wound bed. There is a small (1-33%) amount of necrotic tissue within the wound bed including Adherent Slough. Assessment Active Problems ICD-10 Non-pressure chronic ulcer of left ankle with other specified severity Disruption of external operation (surgical) wound, not elsewhere classified, subsequent encounter Procedures Wound #2 Pre-procedure diagnosis of Wound #2 is a Trauma, Other located on the Left,Distal Malleolus . There was a Excisional Skin/Subcutaneous Tissue Debridement with a total area of 5 sq cm performed by Maxwell Caul, MD. With the following instrument(s): Curette to remove Viable and Non-Viable tissue/material. Material removed includes Subcutaneous Tissue and Slough and. No specimens were taken. A time out was conducted at 10:13, prior to the start of the procedure. A Minimum amount of bleeding was controlled with Pressure. The procedure was tolerated well. Post Debridement Measurements: 10cm length x 0.5cm width x 0.1cm depth; 0.393cm^3 volume. Character of Wound/Ulcer Post Debridement is stable. Post procedure Diagnosis Wound #2: Same as Pre-Procedure Marshall, Calvin R. ([id number]) Wound #3 Pre-procedure diagnosis of Wound #3 is a Trauma, Other located on the Left,Posterior Malleolus . There was a Excisional Skin/Subcutaneous Tissue Debridement with a total area of 0.9 sq cm performed by Maxwell Caul, MD. With the following instrument(s): Curette to remove Viable and Non-Viable  tissue/material. Material removed includes Subcutaneous Tissue and Slough and. No specimens were taken. A time out was conducted at 10:13, prior to the start of the procedure. A Minimum amount of bleeding was controlled with Pressure. The procedure was tolerated well. Post Debridement Measurements: 1.5cm length x 0.6cm width x 0.1cm depth; 0.071cm^3 volume. Character of Wound/Ulcer Post Debridement is stable. Post procedure Diagnosis Wound #3: Same as Pre-Procedure Wound #1 Pre-procedure diagnosis of Wound #1 is a Trauma, Other located on the Left,Proximal Malleolus . An CHEM CAUT GRANULATION TISS procedure was performed by Maxwell Caul, MD. Post procedure Diagnosis Wound #1: Same as Pre-Procedure Notes: 2 silver nitrate sticks used Plan Follow-up Appointments: Return Appointment in 1 week. Bathing/ Shower/ Hygiene: May shower with wound dressing protected with water repellent cover or cast protector. Edema Control - Lymphedema / Segmental Compressive Device / Other: Optional: One layer of unna paste to top of compression wrap (to act as an anchor). Elevate, Exercise Daily and Avoid Standing for Long Periods of Time. Elevate legs to the level of the heart and pump ankles as often as possible Elevate leg(s) parallel to the floor when sitting. WOUND #1: - Malleolus Wound Laterality: Left, Proximal Primary Dressing: Hydrofera Blue Ready Transfer Foam, 2.5x2.5 (in/in) Discharge Instructions: Apply Hydrofera Blue Ready to wound bed as directed Secondary Dressing: ABD Pad 5x9 (in/in) Discharge Instructions: Cover with ABD pad Compression Wrap: Profore Lite LF 3  Multilayer Compression Bandaging System Discharge Instructions: Apply 3 multi-layer wrap as prescribed. WOUND #2: - Malleolus Wound Laterality: Left, Distal Primary Dressing: Hydrofera Blue Ready Transfer Foam, 2.5x2.5 (in/in) Discharge Instructions: Apply Hydrofera Blue Ready to wound bed as directed Secondary Dressing: ABD Pad  5x9 (in/in) Discharge Instructions: Cover with ABD pad Compression Wrap: Profore Lite LF 3 Multilayer Compression Bandaging System Discharge Instructions: Apply 3 multi-layer wrap as prescribed. WOUND #3: - Malleolus Wound Laterality: Left, Posterior Primary Dressing: Hydrofera Blue Ready Transfer Foam, 2.5x2.5 (in/in) Discharge Instructions: Apply Hydrofera Blue Ready to wound bed as directed Secondary Dressing: ABD Pad 5x9 (in/in) Discharge Instructions: Cover with ABD pad Compression Wrap: Profore Lite LF 3 Multilayer Compression Bandaging System Discharge Instructions: Apply 3 multi-layer wrap as prescribed. Is on work1. We applied Hydrofera Blue to all the wounds 2. I put him in 3 layer compression instead of using the Ace wrap 3. 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 If they could cover his wound carotid products I might be able to have him change the dressing and Ace wrap this is even if he is capable of doing this or he has somebody to help him. 4. I did not see any evidence of infection here although he is still on antibiotics. There was no erythema no purulence no tenderness. He is still walking but using crutches to support himself. I spent 35 minutes in review of this patient's past medical history, face-to-face evaluation and preparation of this record Electronic Signature(s) Signed: 12/20/2020 7:59:07 AM By: Baltazar Najjarobson, Lajuan Godbee MD Entered By: Baltazar Najjarobson, Taniaya Rudder on 12/19/2020 10:32:27 Marshall, [name] ([id number]) -------------------------------------------------------------------------------- ROS/PFSH Details Patient Name: Calvin SpencerMONTGOMERY, Calvin R. Date of Service: 12/19/2020 9:30 AM Medical Record Number: [id number] Patient Account Number: [id number] Date of Birth/Sex: 07/22/1976 [age] y.o. Male) Treating RN: Hansel FeinsteinBishop, Joy Primary Care Provider: SYSTEM, PCP Other Clinician: Lolita CramBurnette, Kyara Referring Provider: Duwayne HeckOGERS, Damek Treating Provider/Extender: Maxwell CaulOBSON, Sergei Delo  G Weeks in Treatment: 0 Information Obtained From Patient Constitutional Symptoms (General Health) Complaints and Symptoms: Negative for: Fatigue; Fever; Chills; Marked Weight Change Eyes Complaints and Symptoms: Negative for: Dry Eyes; Vision Changes; Glasses / Contacts Ear/Nose/Mouth/Throat Complaints and Symptoms: Negative for: Difficult clearing ears; Sinusitis Hematologic/Lymphatic Complaints and Symptoms: Negative for: Bleeding / Clotting Disorders; Human Immunodeficiency Virus Respiratory Complaints and Symptoms: Negative for: Chronic or frequent coughs; Shortness of Breath Cardiovascular Complaints and Symptoms: Negative for: Chest pain; LE edema Gastrointestinal Complaints and Symptoms: Negative for: Frequent diarrhea; Nausea; Vomiting Endocrine Complaints and Symptoms: Negative for: Hepatitis; Thyroid disease; Polydypsia (Excessive Thirst) Genitourinary Complaints and Symptoms: Negative for: Kidney failure/ Dialysis; Incontinence/dribbling Immunological Complaints and Symptoms: Negative for: Hives; Itching Integumentary (Skin) Complaints and Symptoms: Negative for: Wounds; Bleeding or bruising tendency; Breakdown; Swelling [name], Calvin R. ([id number]) Musculoskeletal Complaints and Symptoms: Negative for: Muscle Pain; Muscle Weakness Neurologic Complaints and Symptoms: Negative for: Numbness/parasthesias; Focal/Weakness Psychiatric Complaints and Symptoms: Negative for: Anxiety; Claustrophobia Oncologic Immunizations Pneumococcal Vaccine: Received Pneumococcal Vaccination: No Implantable Devices None Hospitalization / Surgery History Type of Hospitalization/Surgery hernia repair Family and Social History Cancer: No; Diabetes: No; Heart Disease: Yes - Father; Hypertension: No; Kidney Disease: No; Lung Disease: No; Seizures: No; Stroke: No; Thyroid Problems: No; Tuberculosis: No; Former smoker - ended on 09/05/2020; Marital Status - Divorced;  Alcohol Use: Rarely; Drug Use: No History; Caffeine Use: Daily; Financial Concerns: No; Food, Clothing or Shelter Needs: No; Support System Lacking: No; Transportation Concerns: No Electronic Signature(s) Signed: 12/19/2020 4:51:02 PM By: Hansel FeinsteinBishop, Joy Signed: 12/20/2020 7:59:07 AM By: Baltazar Najjarobson, Tanji Storrs MD Entered By:  Bishop, Joy on 12/19/2020 09:44:13 [name], [name] ([id number]) -------------------------------------------------------------------------------- SuperBill Details Patient Name: JAMARQUIS, CRULL. Date of Service: 12/19/2020 Medical Record Number: [id number] Patient Account Number: [id number] Date of Birth/Sex: 09/04/1976 (45 y.o. Male) Treating RN: Huel Coventry Primary Care Provider: SYSTEM, PCP Other Clinician: Lolita Cram Referring Provider: Duwayne Heck Treating Provider/Extender: Maxwell Caul Weeks in Treatment: 0 Diagnosis Coding ICD-10 Codes Code Description L97.328 Non-pressure chronic ulcer of left ankle with other specified severity T81.31XD Disruption of external operation (surgical) wound, not elsewhere classified, subsequent encounter Facility Procedures CPT4 Code: [id number] Description: 99213 - WOUND CARE VISIT-LEV 3 EST PT Modifier: Quantity: 1 CPT4 Code: 30865784 Description: 11042 - DEB SUBQ TISSUE 20 SQ CM/< Modifier: Quantity: 1 CPT4 Code: Description: ICD-10 Diagnosis Description L97.328 Non-pressure chronic ulcer of left ankle with other specified severity Modifier: Quantity: CPT4 Code: 69629528 Description: 17250 - CHEM CAUT GRANULATION TISS Modifier: 59 Quantity: 1 CPT4 Code: Description: ICD-10 Diagnosis Description L97.328 Non-pressure chronic ulcer of left ankle with other specified severity Modifier: Quantity: Physician Procedures CPT4 Code Description: [id number] WC PHYS LEVEL 3 o NEW PT Modifier: 25 Quantity: 1 CPT4 Code Description: ICD-10 Diagnosis Description L97.328 Non-pressure chronic ulcer of left ankle with other  specified severity T81.31XD Disruption of external operation (surgical) wound, not elsewhere classified, Modifier: subsequent enco Quantity: unter CPT4 Code Description: [id number] 11042 - WC PHYS SUBQ TISS 20 SQ CM Modifier: Quantity: 1 CPT4 Code Description: ICD-10 Diagnosis Description L97.328 Non-pressure chronic ulcer of left ankle with other specified severity Modifier: Quantity: CPT4 Code Description: [id number] 17250 - WC PHYS CHEM CAUT GRAN TISSUE Modifier: 59 Quantity: 1 CPT4 Code Description: ICD-10 Diagnosis Description L97.328 Non-pressure chronic ulcer of left ankle with other specified severity Modifier: Quantity: Electronic Signature(s) Signed: 12/20/2020 7:59:07 AM By: Baltazar Najjar MD Entered By: Baltazar Najjar on 12/19/2020 10:32:58

## 2020-12-21 NOTE — Progress Notes (Signed)
SEVE, MONETTE ([id number]) Visit Report for 12/19/2020 Allergy List Details Patient Name: [name], [name] Date of Service: 12/19/2020 9:30 AM Medical Record Number: [id number] Patient Account Number: [id number] Date of Birth/Sex: [birth year] (45 y.o. Male) Treating RN: Hansel Feinstein Primary Care Bleu Moisan: SYSTEM, PCP Other Clinician: Lolita Cram Referring Catherine Oak: Duwayne Heck Treating Jodine Muchmore/Extender: Maxwell Caul Weeks in Treatment: 0 Allergies Active Allergies No Known Drug Allergies Allergy Notes Electronic Signature(s) Signed: 12/19/2020 4:51:02 PM By: Hansel Feinstein Entered By: Hansel Feinstein on 12/19/2020 09:40:59 Dave, Cecille Aver ([id number]) -------------------------------------------------------------------------------- Arrival Information Details Patient Name: [name] Date of Service: 12/19/2020 9:30 AM Medical Record Number: [id number] Patient Account Number: [id number] Date of Birth/Sex: 1976/05/24 (44 y.o. Male) Treating RN: Hansel Feinstein Primary Care Minal Stuller: SYSTEM, PCP Other Clinician: Lolita Cram Referring Brinlynn Gorton: Duwayne Heck Treating Caeden Foots/Extender: Altamese Fulton in Treatment: 0 Visit Information Patient Arrived: Crutches Arrival Time: 09:34 Accompanied By: self Transfer Assistance: None Patient Identification Verified: Yes Secondary Verification Process Completed: Yes Patient Has Alerts: Yes Patient Alerts: NOT DIABETIC Electronic Signature(s) Signed: 12/19/2020 4:51:02 PM By: Hansel Feinstein Entered By: Hansel Feinstein on 12/19/2020 10:02:09 Dadisman, Cecille Aver ([id number]) -------------------------------------------------------------------------------- Clinic Level of Care Assessment Details Patient Name: [name] Date of Service: 12/19/2020 9:30 AM Medical Record Number: [id number] Patient Account Number: [id number] Date of Birth/Sex: [birth year] (45 y.o. Male) Treating RN: Huel Coventry Primary Care Jaryah Aracena:  SYSTEM, PCP Other Clinician: Lolita Cram Referring Dinesha Twiggs: Duwayne Heck Treating Lysle Yero/Extender: Altamese Drexel in Treatment: 0 Clinic Level of Care Assessment Items TOOL 1 Quantity Score  - Use when EandM and Procedure is performed on INITIAL visit 0 ASSESSMENTS - Nursing Assessment / Reassessment X - General Physical Exam (combine w/ comprehensive assessment (listed just below) when performed on new 1 20 pt. evals) X- 1 25 Comprehensive Assessment (HX, ROS, Risk Assessments, Wounds Hx, etc.) ASSESSMENTS - Wound and Skin Assessment / Reassessment  - Dermatologic / Skin Assessment (not related to wound area) 0 ASSESSMENTS - Ostomy and/or Continence Assessment and Care  - Incontinence Assessment and Management 0  - 0 Ostomy Care Assessment and Management (repouching, etc.) PROCESS - Coordination of Care X - Simple Patient / Family Education for ongoing care 1 15  - 0 Complex (extensive) Patient / Family Education for ongoing care X- 1 10 Staff obtains Consents, Records, Test Results / Process Orders  - 0 Staff telephones HHA, Nursing Homes / Clarify orders / etc  - 0 Routine Transfer to another Facility (non-emergent condition)  - 0 Routine Hospital Admission (non-emergent condition) X- 1 15 New Admissions / Manufacturing engineer / Ordering NPWT, Apligraf, etc.  - 0 Emergency Hospital Admission (emergent condition) PROCESS - Special Needs  - Pediatric / Minor Patient Management 0  - 0 Isolation Patient Management  - 0 Hearing / Language / Visual special needs  - 0 Assessment of Community assistance (transportation, D/C planning, etc.)  - 0 Additional assistance / Altered mentation  - 0 Support Surface(s) Assessment (bed, cushion, seat, etc.) INTERVENTIONS - Miscellaneous  - External ear exam 0  - 0 Patient Transfer (multiple staff / Nurse, adult / Similar devices)  - 0 Simple Staple / Suture removal (25 or  less)  - 0 Complex Staple / Suture removal (26 or more)  - 0 Hypo/Hyperglycemic Management (do not check if billed separately) X- 1 15 Ankle / Brachial Index (ABI) - do not check if billed separately Has the patient been seen at the hospital within the last three years: Yes  Total Score: 100 Level Of Care: New/Established - Level 3 KOA, ZOELLER ([id number]) Electronic Signature(s) Signed: 12/19/2020 5:25:07 PM By: Elliot Gurney, BSN, RN, CWS, Kim RN, BSN Entered By: Elliot Gurney, BSN, RN, CWS, Kim on 12/19/2020 10:25:37 [name] ([id number]) -------------------------------------------------------------------------------- Encounter Discharge Information Details Patient Name: [name] Date of Service: 12/19/2020 9:30 AM Medical Record Number: [id number] Patient Account Number: [id number] Date of Birth/Sex: Jun 24, 1976 (45 y.o. Male) Treating RN: Yevonne Pax Primary Care Brennley Curtice: SYSTEM, PCP Other Clinician: Lolita Cram Referring Zyanya Glaza: Duwayne Heck Treating Sakira Dahmer/Extender: Altamese [place]itte in Treatment: 0 Encounter Discharge Information Items Post Procedure Vitals Discharge Condition: Stable Temperature (F): 98.1 Ambulatory Status: Ambulatory Pulse (bpm): 89 Discharge Destination: Home Respiratory Rate (breaths/min): 18 Transportation: Private Auto Blood Pressure (mmHg): 124/78 Accompanied By: self Schedule Follow-up Appointment: Yes Clinical Summary of Care: Electronic Signature(s) Signed: 12/21/2020 8:10:37 AM By: Yevonne Pax RN Entered By: Yevonne Pax on 12/19/2020 10:40:09 Schroader, Cecille Aver ([id number]) -------------------------------------------------------------------------------- Lower Extremity Assessment Details Patient Name: [name] Date of Service: 12/19/2020 9:30 AM Medical Record Number: [id number] Patient Account Number: [id number] Date of Birth/Sex: 02/11/1976 (45 y.o. Male) Treating RN: Hansel Feinstein Primary Care  Imoni Kohen: SYSTEM, PCP Other Clinician: Lolita Cram Referring Avya Flavell: Duwayne Heck Treating Felise Georgia/Extender: Maxwell Caul Weeks in Treatment: 0 Edema Assessment Assessed: [Left: Yes] [Right: No] Edema: [Left: Ye] [Right: s] Calf Left: Right: Point of Measurement: 41 cm From Medial Instep 40 cm Ankle Left: Right: Point of Measurement: 10 cm From Medial Instep 26.4 cm Knee To Floor Left: Right: From Medial Instep 45 cm Vascular Assessment Pulses: Dorsalis Pedis Palpable: [Left:Yes] Doppler Audible: [Left:Yes] Blood Pressure: Brachial: [Left:118] Dorsalis Pedis: 140 Ankle: Posterior Tibial: 78 Ankle Brachial Index: [Left:1.19] Electronic Signature(s) Signed: 12/19/2020 4:51:02 PM By: Hansel Feinstein Entered By: Hansel Feinstein on 12/19/2020 10:00:32 Zolman, Cecille Aver ([id number]) -------------------------------------------------------------------------------- Multi Wound Chart Details Patient Name: [name] Date of Service: 12/19/2020 9:30 AM Medical Record Number: [id number] Patient Account Number: [id number] Date of Birth/Sex: Nov 20, 1975 (45 y.o. Male) Treating RN: Huel Coventry Primary Care Kevron Patella: SYSTEM, PCP Other Clinician: Lolita Cram Referring Deirdre Gryder: Duwayne Heck Treating Detta Mellin/Extender: Maxwell Caul Weeks in Treatment: 0 Vital Signs Height(in): 73 Pulse(bpm): 89 Weight(lbs): 260 Blood Pressure(mmHg): 124/78 Body Mass Index(BMI): 34 Temperature(F): 98.1 Respiratory Rate(breaths/min): 18 Photos: Wound Location: Left, Proximal Malleolus Left, Distal Malleolus Left, Posterior Malleolus Wounding Event: Trauma Trauma Trauma Primary Etiology: Trauma, Other Trauma, Other Trauma, Other Date Acquired: 11/19/2020 11/19/2020 11/19/2020 Weeks of Treatment: 0 0 0 Wound Status: Open Open Open Measurements L x W x D (cm) 1x0.7x0.1 10x0.5x0.1 1.5x0.6x0.1 Area (cm) : 0.55 3.927 0.707 Volume (cm) : 0.055 0.393 0.071 % Reduction in Area: 0.00%  0.00% N/A % Reduction in Volume: 0.00% 0.00% N/A Classification: Full Thickness Without Exposed Full Thickness Without Exposed Full Thickness Without Exposed Support Structures Support Structures Support Structures Exudate Amount: N/A Small Small Exudate Type: N/A Sanguinous Serous Exudate Color: N/A red amber Granulation Amount: Large (67-100%) Medium (34-66%) Medium (34-66%) Granulation Quality: Red, Pink Red, Pink Red, Pink Necrotic Amount: None Present (0%) Small (1-33%) Small (1-33%) Exposed Structures: Fat Layer (Subcutaneous Tissue): Fat Layer (Subcutaneous Tissue): Fat Layer (Subcutaneous Tissue): Yes Yes Yes Debridement: N/A Debridement - Excisional Debridement - Excisional Pre-procedure Verification/Time N/A 10:13 10:13 Out Taken: Tissue Debrided: N/A Subcutaneous, Slough Subcutaneous, Slough Level: N/A Skin/Subcutaneous Tissue Skin/Subcutaneous Tissue Debridement Area (sq cm): N/A 5 0.9 Instrument: N/A Curette Curette Bleeding: N/A Minimum Minimum Hemostasis Achieved: N/A Pressure Pressure Debridement Treatment N/A Procedure was tolerated  well Procedure was tolerated well Response: Post Debridement N/A 10x0.5x0.1 1.5x0.6x0.1 Measurements L x W x D (cm) Post Debridement Volume: N/A 0.393 0.071 (cm) Procedures Performed: CHEM CAUT GRANULATION TISS Debridement Debridement Treatment Notes [name], Calvin R. ([id number]) Electronic Signature(s) Signed: 12/20/2020 7:59:07 AM By: Baltazar Najjarobson, Michael MD Entered By: Baltazar Najjarobson, Michael on 12/19/2020 10:26:35 Jentsch, Cecille AverJASON R. ([id number]) -------------------------------------------------------------------------------- Multi-Disciplinary Care Plan Details Patient Name: [name], Calvin R. Date of Service: 12/19/2020 9:30 AM Medical Record Number: [id number] Patient Account Number: [id number] Date of Birth/Sex: 08/05/1976 [age] y.o. Male) Treating RN: Huel CoventryWoody, Kim Primary Care Shavana Calder: SYSTEM, PCP Other Clinician: Lolita CramBurnette,  Kyara Referring Omario Ander: Duwayne HeckOGERS, Kavish Treating Fujiko Picazo/Extender: Altamese CarolinaOBSON, MICHAEL G Weeks in Treatment: 0 Active Inactive Abuse / Safety / Falls / Self Care Management Nursing Diagnoses: History of Falls Goals: Patient will not experience any injury related to falls Date Initiated: 12/19/2020 Target Resolution Date: 12/19/2020 Goal Status: Active Patient/caregiver will verbalize understanding of skin care regimen Date Initiated: 12/19/2020 Target Resolution Date: 12/19/2020 Goal Status: Active Interventions: Provide education on fall prevention Provide education on personal and home safety Notes: Orientation to the Wound Care Program Nursing Diagnoses: Knowledge deficit related to the wound healing center program Goals: Patient/caregiver will verbalize understanding of the Wound Healing Center Program Date Initiated: 12/19/2020 Target Resolution Date: 12/19/2020 Goal Status: Active Interventions: Provide education on orientation to the wound center Notes: Pain, Acute or Chronic Nursing Diagnoses: Pain, acute or chronic: actual or potential Goals: Patient will verbalize adequate pain control and receive pain control interventions during procedures as needed Date Initiated: 12/19/2020 Target Resolution Date: 12/19/2020 Goal Status: Active Interventions: Assess comfort goal upon admission Treatment Activities: Administer pain control measures as ordered : 12/19/2020 Notes: Wound/Skin Impairment Nursing Diagnoses: [name], Calvin R. ([id number]) Impaired tissue integrity Goals: Patient/caregiver will verbalize understanding of skin care regimen Date Initiated: 12/19/2020 Target Resolution Date: 12/19/2020 Goal Status: Active Ulcer/skin breakdown will have a volume reduction of 30% by week 4 Date Initiated: 12/19/2020 Target Resolution Date: 01/18/2021 Goal Status: Active Interventions: Assess ulceration(s) every visit Treatment Activities: Skin care regimen initiated :  12/19/2020 Topical wound management initiated : 12/19/2020 Notes: Electronic Signature(s) Signed: 12/19/2020 5:25:07 PM By: Elliot GurneyWoody, BSN, RN, CWS, Kim RN, BSN Entered By: Elliot GurneyWoody, BSN, RN, CWS, Kim on 12/19/2020 10:16:32 Lynch, Cecille AverJASON R. ([id number]) -------------------------------------------------------------------------------- Pain Assessment Details Patient Name: [name], Calvin R. Date of Service: 12/19/2020 9:30 AM Medical Record Number: [id number] Patient Account Number: [id number] Date of Birth/Sex: 02/07/1976 [age] y.o. Male) Treating RN: Hansel FeinsteinBishop, Joy Primary Care Seraya Jobst: SYSTEM, PCP Other Clinician: Lolita CramBurnette, Kyara Referring Ngoc Detjen: Duwayne HeckOGERS, Spiro Treating Zeeshan Korte/Extender: Maxwell CaulOBSON, MICHAEL G Weeks in Treatment: 0 Active Problems Location of Pain Severity and Description of Pain Patient Has Paino Yes Site Locations Pain Location: Generalized Pain Rate the pain. Current Pain Level: 2 Pain Management and Medication Current Pain Management: Notes states left ankle pain after walking Electronic Signature(s) Signed: 12/19/2020 4:51:02 PM By: Hansel FeinsteinBishop, Joy Entered By: Hansel FeinsteinBishop, Joy on 12/19/2020 09:37:37 Martes, Cecille AverJASON R. ([id number]) -------------------------------------------------------------------------------- Patient/Caregiver Education Details Patient Name: [name], Calvin R. Date of Service: 12/19/2020 9:30 AM Medical Record Number: [id number] Patient Account Number: [id number] Date of Birth/Gender: 03/03/1976 [age] y.o. Male) Treating RN: Huel CoventryWoody, Kim Primary Care Physician: SYSTEM, PCP Other Clinician: Lolita CramBurnette, Kyara Referring Physician: Duwayne HeckOGERS, Treavor Treating Physician/Extender: Altamese CarolinaOBSON, MICHAEL G Weeks in Treatment: 0 Education Assessment Education Provided To: Patient Education Topics Provided Safety: Handouts: Safety Methods: Demonstration, Explain/Verbal Responses: State content correctly Welcome To The [facility]: Handouts: Welcome To The [facility]  [facility] Methods: Demonstration, Explain/Verbal  Responses: State content correctly Wound/Skin Impairment: Handouts: Caring for Your Ulcer Methods: Demonstration, Explain/Verbal Responses: State content correctly Electronic Signature(s) Signed: 12/19/2020 5:25:07 PM By: Elliot Gurney, BSN, RN, CWS, Kim RN, BSN Entered By: Elliot Gurney, BSN, RN, CWS, Kim on 12/19/2020 10:26:15 [name] ([id number]) -------------------------------------------------------------------------------- Wound Assessment Details Patient Name: [name] Date of Service: 12/19/2020 9:30 AM Medical Record Number: [id number] Patient Account Number: [id number] Date of Birth/Sex: December 28, 1975 (45 y.o. Male) Treating RN: Hansel Feinstein Primary Care Kelley Polinsky: SYSTEM, PCP Other Clinician: Lolita Cram Referring Sion Reinders: Duwayne Heck Treating Khadeejah Castner/Extender: Maxwell Caul Weeks in Treatment: 0 Wound Status Wound Number: 1 Primary Etiology: Trauma, Other Wound Location: Left, Proximal Malleolus Wound Status: Open Wounding Event: Trauma Date Acquired: 11/19/2020 Weeks Of Treatment: 0 Clustered Wound: No Photos Wound Measurements Length: (cm) 1 Width: (cm) 0.7 Depth: (cm) 0.1 Area: (cm) 0.55 Volume: (cm) 0.055 % Reduction in Area: 0% % Reduction in Volume: 0% Tunneling: No Undermining: No Wound Description Classification: Full Thickness Without Exposed Support Structure s Foul Odor After Cleansing: No Slough/Fibrino Yes Wound Bed Granulation Amount: Large (67-100%) Exposed Structure Granulation Quality: Red, Pink Fat Layer (Subcutaneous Tissue) Exposed: Yes Necrotic Amount: None Present (0%) Treatment Notes Wound #1 (Malleolus) Wound Laterality: Left, Proximal Cleanser Peri-Wound Care Topical Primary Dressing Hydrofera Blue Ready Transfer Foam, 2.5x2.5 (in/in) Discharge Instruction: Apply Hydrofera Blue Ready to wound bed as directed Secondary Dressing ABD Pad 5x9 (in/in) Winnick, Auden  Elvera Lennox ([id number]) Discharge Instruction: Cover with ABD pad Secured With Compression Wrap Profore Lite LF 3 Multilayer Compression Bandaging System Discharge Instruction: Apply 3 multi-layer wrap as prescribed. Compression Stockings Add-Ons Electronic Signature(s) Signed: 12/19/2020 4:51:02 PM By: Hansel Feinstein Entered By: Hansel Feinstein on 12/19/2020 09:58:34 Soave, Isahi Elvera Lennox ([id number]) -------------------------------------------------------------------------------- Wound Assessment Details Patient Name: [name] Date of Service: 12/19/2020 9:30 AM Medical Record Number: [id number] Patient Account Number: [id number] Date of Birth/Sex: Mar 21, 1976 (45 y.o. Male) Treating RN: Hansel Feinstein Primary Care Lylee Corrow: SYSTEM, PCP Other Clinician: Lolita Cram Referring Alyssabeth Bruster: Duwayne Heck Treating Humphrey Guerreiro/Extender: Maxwell Caul Weeks in Treatment: 0 Wound Status Wound Number: 2 Primary Etiology: Trauma, Other Wound Location: Left, Distal Malleolus Wound Status: Open Wounding Event: Trauma Date Acquired: 11/19/2020 Weeks Of Treatment: 0 Clustered Wound: No Photos Wound Measurements Length: (cm) 10 Width: (cm) 0.5 Depth: (cm) 0.1 Area: (cm) 3.927 Volume: (cm) 0.393 % Reduction in Area: 0% % Reduction in Volume: 0% Tunneling: No Undermining: No Wound Description Classification: Full Thickness Without Exposed Support Structu Exudate Amount: Small Exudate Type: Sanguinous Exudate Color: red res Foul Odor After Cleansing: No Slough/Fibrino Yes Wound Bed Granulation Amount: Medium (34-66%) Exposed Structure Granulation Quality: Red, Pink Fat Layer (Subcutaneous Tissue) Exposed: Yes Necrotic Amount: Small (1-33%) Necrotic Quality: Adherent Slough Treatment Notes Wound #2 (Malleolus) Wound Laterality: Left, Distal Cleanser Peri-Wound Care Topical Primary Dressing Hydrofera Blue Ready Transfer Foam, 2.5x2.5 (in/in) Discharge Instruction: Apply Hydrofera Blue  Ready to wound bed as directed DREVON, PLOG ([id number]) Secondary Dressing ABD Pad 5x9 (in/in) Discharge Instruction: Cover with ABD pad Secured With Compression Wrap Profore Lite LF 3 Multilayer Compression Bandaging System Discharge Instruction: Apply 3 multi-layer wrap as prescribed. Compression Stockings Add-Ons Electronic Signature(s) Signed: 12/19/2020 4:51:02 PM By: Hansel Feinstein Entered By: Hansel Feinstein on 12/19/2020 09:58:06 Peffley, Cecille Aver ([id number]) -------------------------------------------------------------------------------- Wound Assessment Details Patient Name: [name] Date of Service: 12/19/2020 9:30 AM Medical Record Number: [id number] Patient Account Number: [id number] Date of Birth/Sex: [birth year] (45 y.o. Male) Treating RN: Hansel Feinstein Primary Care Jashua Knaak: SYSTEM, PCP Other  Clinician: Lolita Cram Referring Sylvan Sookdeo: Duwayne Heck Treating Ariannah Arenson/Extender: Altamese [place] in Treatment: 0 Wound Status Wound Number: 3 Primary Etiology: Trauma, Other Wound Location: Left, Posterior Malleolus Wound Status: Open Wounding Event: Trauma Date Acquired: 11/19/2020 Weeks Of Treatment: 0 Clustered Wound: No Photos Wound Measurements Length: (cm) 1.5 Width: (cm) 0.6 Depth: (cm) 0.1 Area: (cm) 0.707 Volume: (cm) 0.071 % Reduction in Area: % Reduction in Volume: Tunneling: No Undermining: No Wound Description Classification: Full Thickness Without Exposed Support Structu Exudate Amount: Small Exudate Type: Serous Exudate Color: amber res Foul Odor After Cleansing: No Slough/Fibrino No Wound Bed Granulation Amount: Medium (34-66%) Exposed Structure Granulation Quality: Red, Pink Fat Layer (Subcutaneous Tissue) Exposed: Yes Necrotic Amount: Small (1-33%) Necrotic Quality: Adherent Slough Treatment Notes Wound #3 (Malleolus) Wound Laterality: Left, Posterior Cleanser Peri-Wound Care Topical Primary  Dressing Hydrofera Blue Ready Transfer Foam, 2.5x2.5 (in/in) Discharge Instruction: Apply Hydrofera Blue Ready to wound bed as directed AVIER, JECH ([id number]) Secondary Dressing ABD Pad 5x9 (in/in) Discharge Instruction: Cover with ABD pad Secured With Compression Wrap Profore Lite LF 3 Multilayer Compression Bandaging System Discharge Instruction: Apply 3 multi-layer wrap as prescribed. Compression Stockings Add-Ons Electronic Signature(s) Signed: 12/19/2020 4:51:02 PM By: Hansel Feinstein Entered By: Hansel Feinstein on 12/19/2020 09:57:35 Mcdermid, Cecille Aver ([id number]) -------------------------------------------------------------------------------- Vitals Details Patient Name: [name] Date of Service: 12/19/2020 9:30 AM Medical Record Number: [id number] Patient Account Number: [id number] Date of Birth/Sex: [birth year] (45 y.o. Male) Treating RN: Hansel Feinstein Primary Care Jahaan Vanwagner: SYSTEM, PCP Other Clinician: Lolita Cram Referring Deionna Marcantonio: Duwayne Heck Treating Tadashi Burkel/Extender: Maxwell Caul Weeks in Treatment: 0 Vital Signs Time Taken: 09:40 Temperature (F): 98.1 Height (in): 73 Pulse (bpm): 89 Weight (lbs): 260 Respiratory Rate (breaths/min): 18 Body Mass Index (BMI): 34.3 Blood Pressure (mmHg): 124/78 Reference Range: 80 - 120 mg / dl Electronic Signature(s) Signed: 12/19/2020 4:51:02 PM By: Hansel Feinstein Entered ByHansel Feinstein on 12/19/2020 09:48:36

## 2020-12-26 ENCOUNTER — Encounter: Payer: Worker's Compensation | Admitting: Internal Medicine

## 2020-12-26 ENCOUNTER — Other Ambulatory Visit: Payer: Self-pay

## 2020-12-26 DIAGNOSIS — L97328 Non-pressure chronic ulcer of left ankle with other specified severity: Secondary | ICD-10-CM | POA: Diagnosis not present

## 2020-12-26 NOTE — Progress Notes (Signed)
SAHAS, SLUKA ([id number]) Visit Report for 12/26/2020 HPI Details Patient Name: [name], [name] Date of Service: 12/26/2020 10:00 AM Medical Record Number: [id number] Patient Account Number: [id number] Date of Birth/Sex: [birth year] (45 y.o. M) Treating RN: Rogers Blocker Primary Care Provider: SYSTEM, PCP Other Clinician: Lolita Cram Referring Provider: Duwayne Heck Treating Provider/Extender: Altamese [place] in Treatment: 1 History of Present Illness HPI Description: ADMISSION 12/19/2020 This is a 45 year old otherwise healthy man who had a fall at work suffering a left ankle fracture on 10/02/2020. He required operative correction with pinning. I am not exactly sure how he did in the interim how he had however he had another fall apparently in late October falling down some stairs and that is when he describes 3 separate areas opening. He is followed with Dr. Aundria Rud at [facility]. I really cannot get a sense exactly how these progressed however he is simply been putting dry gauze on this and an Ace wrap. I guess after the second fall he had a lot of swelling and Dr. Aundria Rud has put him on 2 empiric trials of Keflex. Things seem to be settling down more. He is not a diabetic does not have known arterial disease. He has had previous knee surgery. ABI in our clinic was 1.19 on the left 4/13; 3 small areas. The area on the incision line posteriorly looks as though it is about to fully epithelialized. The other 2 areas both had friable hyper granulation 1 is above the lateral malleolus and one below more in the heel area. I used silver nitrate on both of these areas. 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 Date of Service: 12/26/2020 10:00 AM Medical Record Number: [id number] Patient Account Number: [id number] Date of Birth/Sex: May 06, 1976 (45 y.o. M) Treating RN: Rogers Blocker Primary Care Provider: SYSTEM, PCP Other Clinician: Lolita Cram Referring Provider: Duwayne Heck Treating Provider/Extender: Altamese [place] in Treatment: 1 Procedure Performed for: Wound #1 Left,Proximal Malleolus Performed By: Physician Maxwell Caul, MD Post Procedure Diagnosis Same as Pre-procedure Notes MD used 2 sticks of silver nitrate Electronic Signature(s) Signed: 12/26/2020 5:03:12 PM By: Baltazar Najjar MD Entered By: Baltazar Najjar on 12/26/2020 12:12:42 Bolinger, Cecille Aver ([id number]) -------------------------------------------------------------------------------- Gaynelle Adu TISS Details Patient Name: [name] Date of Service: 12/26/2020 10:00 AM Medical Record Number: [id number] Patient Account Number: [id number] Date of Birth/Sex: 11/09/1975 (45 y.o. M) Treating RN: Rogers Blocker Primary Care Provider: SYSTEM, PCP Other Clinician: Lolita Cram Referring Provider: Duwayne Heck Treating Provider/Extender: Altamese [place] in Treatment: 1 Procedure Performed for: Wound #2 Left,Distal Malleolus Performed By: Physician Maxwell Caul, MD Post Procedure Diagnosis Same as Pre-procedure Notes MD used 2 sticks of silver nitrate Electronic Signature(s) Signed: 12/26/2020 5:03:12 PM By: Baltazar Najjar MD Entered By: Baltazar Najjar on 12/26/2020 12:12:51 Zane, Cecille Aver ([id number]) -------------------------------------------------------------------------------- Physical Exam  Details Patient Name: [name] Date of Service: 12/26/2020 10:00 AM Medical Record Number: [id number] Patient Account Number: [id number] Date of Birth/Sex: 10/21/1975 (45 y.o. M) Treating RN: Rogers Blocker Primary Care Provider: SYSTEM, PCP Other Clinician: Lolita Cram Referring  Provider: Duwayne Heck Treating Provider/Extender: Altamese [place] in Treatment: 1 Constitutional Sitting or standing Blood Pressure is within target range for patient.. Pulse regular and within target range for patient.Marland Kitchen Respirations regular, non- labored and within target range.. Temperature is normal and within the target range for the patient.Marland Kitchen appears in no distress. Notes Wound exam; the patient continues to have 3 areas. One is on the posterior incision line this is very superficial and looks like it is epithelializing. No interventions required oThe area above the medial malleolus is hyper granulated with friable tissue but a small wound I used silver nitrate on this oThe area on the lateral calcaneus has some depth but again friable tissue I also used silver nitrate on this. oNo evidence of infection in any wound area Electronic Signature(s) Signed: 12/26/2020 5:03:12 PM By: Baltazar Najjar MD Entered By: Baltazar Najjar on 12/26/2020 12:16:32 Cassedy, Cecille Aver ([id number]) -------------------------------------------------------------------------------- Physician Orders Details Patient Name: [name] Date of Service: 12/26/2020 10:00 AM Medical Record Number: [id number] Patient Account Number: [id number] Date of Birth/Sex: [birth year] (45 y.o. M) Treating RN: Rogers Blocker Primary Care Provider: SYSTEM, PCP Other Clinician: Lolita Cram Referring Provider: Duwayne Heck Treating Provider/Extender: Altamese [place] in Treatment: 1 Verbal / Phone Orders: No Diagnosis Coding Follow-up Appointments o Return Appointment in 2 weeks. - MD appt o Nurse  Visit as needed - Next week Bathing/ Shower/ Hygiene o May shower with wound dressing protected with water repellent cover or cast protector. Edema Control - Lymphedema / Segmental Compressive Device / Other o Optional: One layer of unna paste to top of compression wrap (to act as an anchor). o Elevate, Exercise Daily and Avoid Standing for Long Periods of Time. o Elevate legs to the level of the heart and pump ankles as often as possible o Elevate leg(s) parallel to the floor when sitting. Wound Treatment Wound #1 - Malleolus Wound Laterality: Left, Proximal Primary Dressing: Hydrofera Blue Ready Transfer Foam, 2.5x2.5 (in/in) 2 x Per Week/30 Days Discharge Instructions: Apply Hydrofera Blue Ready to wound bed as directed Secondary Dressing: ABD Pad 5x9 (in/in) 2 x Per Week/30 Days Discharge Instructions: Cover with ABD pad Compression Wrap: Profore Lite LF 3 Multilayer Compression Bandaging System 2 x Per Week/30 Days Discharge Instructions: Apply 3 multi-layer wrap as prescribed. Wound #2 - Malleolus Wound Laterality: Left, Distal Primary Dressing: Hydrofera Blue Ready Transfer Foam, 2.5x2.5 (in/in) Discharge Instructions: Apply Hydrofera Blue Ready to wound bed as directed Secondary Dressing: ABD Pad 5x9 (in/in) Discharge Instructions: Cover with ABD pad Compression Wrap: Profore Lite LF 3 Multilayer Compression Bandaging System Discharge Instructions: Apply 3 multi-layer wrap as prescribed. Wound #3 - Malleolus Wound Laterality: Left, Posterior Primary Dressing: Hydrofera Blue Ready Transfer Foam, 2.5x2.5 (in/in) Discharge Instructions: Apply Hydrofera Blue Ready to wound bed as directed Secondary Dressing: ABD Pad 5x9 (in/in) Discharge Instructions: Cover with ABD pad Compression Wrap: Profore Lite LF 3 Multilayer Compression Bandaging System Discharge Instructions: Apply 3 multi-layer wrap as prescribed. Electronic Signature(s) Signed: 12/26/2020 4:47:42 PM By: Phillis Haggis, Dondra Prader RN Signed: 12/26/2020 5:03:12 PM By: Baltazar Najjar MD Entered By: Phillis Haggis, Dondra Prader on 12/26/2020 10:50:44 [place]ix, Cecille Aver ([id number]) -------------------------------------------------------------------------------- Problem List Details Patient Name: ALEXEY, RHOADS. Date of Service: 12/26/2020 10:00 AM Medical Record Number: [id number] Patient Account Number: [id number] Date of Birth/Sex: [birth year] (45 y.o. M) Treating RN: Rogers Blocker Primary Care Provider: SYSTEM, PCP Other Clinician: Lolita Cram Referring Provider: Duwayne Heck Treating Provider/Extender: Maxwell Caul Weeks in Treatment: 1 Active  Problems ICD-10 Encounter Code Description Active Date MDM Diagnosis L97.328 Non-pressure chronic ulcer of left ankle with other specified severity 12/19/2020 No Yes T81.31XD Disruption of external operation (surgical) wound, not elsewhere 12/19/2020 No Yes classified, subsequent encounter Inactive Problems Resolved Problems Electronic Signature(s) Signed: 12/26/2020 5:03:12 PM By: Baltazar Najjar MD Entered By: Baltazar Najjar on 12/26/2020 12:12:23 Ksiazek, Cecille Aver ([id number]) -------------------------------------------------------------------------------- Progress Note Details Patient Name: [name] Date of Service: 12/26/2020 10:00 AM Medical Record Number: [id number] Patient Account Number: [id number] Date of Birth/Sex: 1975-10-01 (45 y.o. M) Treating RN: Rogers Blocker Primary Care Provider: SYSTEM, PCP Other Clinician: Lolita Cram Referring Provider: Duwayne Heck Treating Provider/Extender: Altamese [place] in Treatment: 1 Subjective History of Present Illness (HPI) ADMISSION 12/19/2020 This is a 45 year old otherwise healthy man who had a fall at work suffering a left ankle fracture on 10/02/2020. He required operative correction with pinning. I am not exactly sure how he did in the interim how he had however he  had another fall apparently in late October falling down some stairs and that is when he describes 3 separate areas opening. He is followed with Dr. Aundria Rud at [facility]. I really cannot get a sense exactly how these progressed however he is simply been putting dry gauze on this and an Ace wrap. I guess after the second fall he had a lot of swelling and Dr. Aundria Rud has put him on 2 empiric trials of Keflex. Things seem to be settling down more. He is not a diabetic does not have known arterial disease. He has had previous knee surgery. ABI in our clinic was 1.19 on the left 4/13; 3 small areas. The area on the incision line posteriorly looks as though it is about to fully epithelialized. The other 2 areas both had friable hyper granulation 1 is above the lateral malleolus and one below more in the heel area. I used silver nitrate on both of these areas. 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 Pulse regular and within target range for patient.Marland Kitchen Respirations regular, non- labored and within target range.. Temperature is normal and within the target range for the patient.Marland Kitchen appears in no distress. Vitals Time Taken: 9:55 AM, Height: 73 in, Weight: 260 lbs, BMI: 34.3, Temperature: 97.9 F, Pulse: 98 bpm, Respiratory Rate: 18 breaths/min, Blood Pressure: 134/85 mmHg. General Notes: Wound exam; the patient continues to have 3 areas. One is on the posterior incision line this is very superficial and looks like it is epithelializing. No interventions required The area above the medial malleolus is hyper granulated with friable tissue but a small wound I used silver nitrate on this The area  on the lateral calcaneus has some depth but again friable tissue I also used silver nitrate on this. No evidence of infection in any wound area Integumentary (Hair, Skin) Wound #1 status is Open. Original cause of wound was Trauma. The date acquired was: 11/19/2020. The wound has been in treatment 1 weeks. The wound is located on the Left,Proximal Malleolus. The wound measures 0.9cm length x 0.4cm width x 0.1cm depth; 0.283cm^2 area and 0.028cm^3 volume. There is Fat Layer (Subcutaneous Tissue) exposed. There is no tunneling or undermining noted. There is a medium amount  of serosanguineous drainage noted. There is large (67-100%) red, pink granulation within the wound bed. There is no necrotic tissue within the wound bed. Wound #2 status is Open. Original cause of wound was Trauma. The date acquired was: 11/19/2020. The wound has been in treatment 1 weeks. The wound is located on the Left,Distal Malleolus. The wound measures 0.7cm length x 0.4cm width x 0.1cm depth; 0.22cm^2 area and 0.022cm^3 volume. There is Fat Layer (Subcutaneous Tissue) exposed. There is no tunneling or undermining noted. There is a small amount of serosanguineous drainage noted. There is medium (34-66%) red, pink granulation within the wound bed. There is a small (1-33%) amount of necrotic tissue within the wound bed including Adherent Slough. Wound #3 status is Open. Original cause of wound was Trauma. The date acquired was: 11/19/2020. The wound has been in treatment 1 weeks. The wound is located on the Left,Posterior Malleolus. The wound measures 0.1cm length x 0.1cm width x 0.1cm depth; 0.008cm^2 area and 0.001cm^3 volume. There is no tunneling or undermining noted. There is a none present amount of drainage noted. There is no granulation within the wound bed. There is no necrotic tissue within the wound bed. Calvin SpencerMONTGOMERY, Gorman R. ([id number]) Assessment Active Problems ICD-10 Non-pressure chronic ulcer of left ankle with  other specified severity Disruption of external operation (surgical) wound, not elsewhere classified, subsequent encounter Procedures Wound #1 Pre-procedure diagnosis of Wound #1 is a Trauma, Other located on the Left,Proximal Malleolus . There was a Three Layer Compression Therapy Procedure with a pre-treatment ABI of 1.2 by Rogers BlockerSanchez, [name], RN. Post procedure Diagnosis Wound #1: Same as Pre-Procedure Pre-procedure diagnosis of Wound #1 is a Trauma, Other located on the Left,Proximal Malleolus . An CHEM CAUT GRANULATION TISS procedure was performed by Maxwell CaulOBSON, Arabel Barcenas G, MD. Post procedure Diagnosis Wound #1: Same as Pre-Procedure Notes: MD used 2 sticks of silver nitrate Wound #2 Pre-procedure diagnosis of Wound #2 is a Trauma, Other located on the Left,Distal Malleolus . There was a Three Layer Compression Therapy Procedure with a pre-treatment ABI of 1.2 by Rogers BlockerSanchez, [name], RN. Post procedure Diagnosis Wound #2: Same as Pre-Procedure Pre-procedure diagnosis of Wound #2 is a Trauma, Other located on the Left,Distal Malleolus . An CHEM CAUT GRANULATION TISS procedure was performed by Maxwell CaulOBSON, Dechelle Attaway G, MD. Post procedure Diagnosis Wound #2: Same as Pre-Procedure Notes: MD used 2 sticks of silver nitrate Wound #3 Pre-procedure diagnosis of Wound #3 is a Trauma, Other located on the Left,Posterior Malleolus . There was a Three Layer Compression Therapy Procedure with a pre-treatment ABI of 1.2 by Rogers BlockerSanchez, [name], RN. Post procedure Diagnosis Wound #3: Same as Pre-Procedure Plan Follow-up Appointments: Return Appointment in 2 weeks. - MD appt Nurse Visit as needed - Next week Bathing/ Shower/ Hygiene: May shower with wound dressing protected with water repellent cover or cast protector. Edema Control - Lymphedema / Segmental Compressive Device / Other: Optional: One layer of unna paste to top of compression wrap (to act as an anchor). Elevate, Exercise Daily and Avoid Standing for Long  Periods of Time. Elevate legs to the level of the heart and pump ankles as often as possible Elevate leg(s) parallel to the floor when sitting. WOUND #1: - Malleolus Wound Laterality: Left, Proximal Primary Dressing: Hydrofera Blue Ready Transfer Foam, 2.5x2.5 (in/in) 2 x Per Week/30 Days Discharge Instructions: Apply Hydrofera Blue Ready to wound bed as directed Secondary Dressing: ABD Pad 5x9 (in/in) 2 x Per Week/30 Days Discharge Instructions: Cover with ABD pad Compression Wrap: Profore Lite  LF 3 Multilayer Compression Bandaging System 2 x Per Week/30 Days Discharge Instructions: Apply 3 multi-layer wrap as prescribed. WOUND #2: - Malleolus Wound Laterality: Left, Distal Primary Dressing: Hydrofera Blue Ready Transfer Foam, 2.5x2.5 (in/in) Discharge Instructions: Apply Hydrofera Blue Ready to wound bed as directed Secondary Dressing: ABD Pad 5x9 (in/in) Discharge Instructions: Cover with ABD pad Compression Wrap: Profore Lite LF 3 Multilayer Compression Bandaging System Discharge Instructions: Apply 3 multi-layer wrap as prescribed. FURQAN, GOSSELIN ([id number]) WOUND #3: - Malleolus Wound Laterality: Left, Posterior Primary Dressing: Hydrofera Blue Ready Transfer Foam, 2.5x2.5 (in/in) Discharge Instructions: Apply Hydrofera Blue Ready to wound bed as directed Secondary Dressing: ABD Pad 5x9 (in/in) Discharge Instructions: Cover with ABD pad Compression Wrap: Profore Lite LF 3 Multilayer Compression Bandaging System Discharge Instructions: Apply 3 multi-layer wrap as prescribed. 1. Continue with Hydrofera Blue under compression 2. I am really not sure where we are with Workmen's Compensation here. Whether they would consider him for at home changing by home health I am uncertain. 3. His ankle looks fine the wounds are superficial Electronic Signature(s) Signed: 12/26/2020 5:03:12 PM By: Baltazar Najjar MD Entered By: Baltazar Najjar on 12/26/2020 12:20:40 [place]bon, Cecille Aver  ([id number]) -------------------------------------------------------------------------------- SuperBill Details Patient Name: [name] Date of Service: 12/26/2020 Medical Record Number: [id number] Patient Account Number: [id number] Date of Birth/Sex: 23-Sep-1975 (45 y.o. M) Treating RN: Rogers Blocker Primary Care Provider: SYSTEM, PCP Other Clinician: Lolita Cram Referring Provider: Duwayne Heck Treating Provider/Extender: Altamese [place] in Treatment: 1 Diagnosis Coding ICD-10 Codes Code Description [phone] Non-pressure chronic ulcer of left ankle with other specified severity T81.31XD Disruption of external operation (surgical) wound, not elsewhere classified, subsequent encounter Facility Procedures CPT4 Code: [id number] Description: 17250 - CHEM CAUT GRANULATION TISS Modifier: Quantity: 2 CPT4 Code: Description: ICD-10 Diagnosis Description L97.328 Non-pressure chronic ulcer of left ankle with other specified severity Modifier: Quantity: Physician Procedures CPT4 Code: [id number] Description: 17250 - WC PHYS CHEM CAUT GRAN TISSUE Modifier: Quantity: 2 CPT4 Code: Description: ICD-10 Diagnosis Description L97.328 Non-pressure chronic ulcer of left ankle with other specified severity Modifier: Quantity: Electronic Signature(s) Signed: 12/26/2020 5:03:12 PM By: Baltazar Najjar MD Entered By: Baltazar Najjar on 12/26/2020 12:20:51

## 2020-12-27 NOTE — Progress Notes (Signed)
DORNELL, GRASMICK ([id number]) Visit Report for 12/26/2020 Arrival Information Details Patient Name: [name], [name] Date of Service: 12/26/2020 10:00 AM Medical Record Number: [id number] Patient Account Number: [id number] Date of Birth/Sex: 08-29-1976 (45 y.o. M) Treating RN: Rogers Blocker Primary Care Leonela Kivi: SYSTEM, PCP Other Clinician: Lolita Cram Referring Sabastion Hrdlicka: Duwayne Heck Treating Jakhia Buxton/Extender: Altamese [place] in Treatment: 1 Visit Information History Since Last Visit Added or deleted any medications: No Patient Arrived: Crutches Had a fall or experienced change in No Arrival Time: 09:58 activities of daily living that may affect Accompanied By: self risk of falls: Transfer Assistance: None Hospitalized since last visit: No Patient Identification Verified: Yes Pain Present Now: No Secondary Verification Process Completed: Yes Patient Has Alerts: Yes Patient Alerts: NOT DIABETIC Electronic Signature(s) Signed: 12/26/2020 4:32:52 PM By: Lolita Cram Entered By: Lolita Cram on 12/26/2020 09:59:10 Uher, Cecille Aver ([id number]) -------------------------------------------------------------------------------- Clinic Level of Care Assessment Details Patient Name: [name] Date of Service: 12/26/2020 10:00 AM Medical Record Number: [id number] Patient Account Number: [id number] Date of Birth/Sex: April 15, 1976 (44 y.o. M) Treating RN: Rogers Blocker Primary Care Reise Hietala: SYSTEM, PCP Other Clinician: Lolita Cram Referring Liylah Najarro: Duwayne Heck Treating Breiona Couvillon/Extender: Altamese [place] in Treatment: 1 Clinic Level of Care Assessment Items TOOL 1 Quantity Score  - Use when EandM and Procedure is performed on INITIAL visit 0 ASSESSMENTS - Nursing Assessment / Reassessment  - General Physical Exam (combine w/ comprehensive assessment (listed just below) when performed on new 0 pt. evals)  - 0 Comprehensive  Assessment (HX, ROS, Risk Assessments, Wounds Hx, etc.) ASSESSMENTS - Wound and Skin Assessment / Reassessment  - Dermatologic / Skin Assessment (not related to wound area) 0 ASSESSMENTS - Ostomy and/or Continence Assessment and Care  - Incontinence Assessment and Management 0  - 0 Ostomy Care Assessment and Management (repouching, etc.) PROCESS - Coordination of Care  - Simple Patient / Family Education for ongoing care 0  - 0 Complex (extensive) Patient / Family Education for ongoing care  - 0 Staff obtains Chiropractor, Records, Test Results / Process Orders  - 0 Staff telephones HHA, Nursing Homes / Clarify orders / etc  - 0 Routine Transfer to another Facility (non-emergent condition)  - 0 Routine Hospital Admission (non-emergent condition)  - 0 New Admissions / Manufacturing engineer / Ordering NPWT, Apligraf, etc.  - 0 Emergency Hospital Admission (emergent condition) PROCESS - Special Needs  - Pediatric / Minor Patient Management 0  - 0 Isolation Patient Management  - 0 Hearing / Language / Visual special needs  - 0 Assessment of Community assistance (transportation, D/C planning, etc.)  - 0 Additional assistance / Altered mentation  - 0 Support Surface(s) Assessment (bed, cushion, seat, etc.) INTERVENTIONS - Miscellaneous  - External ear exam 0  - 0 Patient Transfer (multiple staff / Nurse, adult / Similar devices)  - 0 Simple Staple / Suture removal (25 or less)  - 0 Complex Staple / Suture removal (26 or more)  - 0 Hypo/Hyperglycemic Management (do not check if billed separately)  - 0 Ankle / Brachial Index (ABI) - do not check if billed separately Has the patient been seen at the hospital within the last three years: Yes Total Score: 0 Level Of Care: ____ [name] ([id number]) Electronic Signature(s) Signed: 12/26/2020 4:47:42 PM By: Phillis Haggis, Dondra Prader RN Entered By: Phillis Haggis, [name] on  12/26/2020 10:51:16 Collet, Cecille Aver ([id number]) -------------------------------------------------------------------------------- Compression Therapy Details Patient Name: [name] Date of Service: 12/26/2020 10:00 AM Medical  Record Number: [id number] Patient Account Number: [id number] Date of Birth/Sex: 09/29/1975 (45 y.o. M) Treating RN: Rogers BlockerSanchez, [name] Primary Care Nysia Dell: SYSTEM, PCP Other Clinician: Lolita CramBurnette, Kyara Referring Abuk Selleck: Duwayne Heckogers, Aashir Treating Mahkayla Preece/Extender: Altamese CarolinaOBSON, MICHAEL G Weeks in Treatment: 1 Compression Therapy Performed for Wound Assessment: Wound #1 Left,Proximal Malleolus Performed By: Ovidio Hangerlinician Sanchez, [name], RN Compression Type: Three Layer Pre Treatment ABI: 1.2 Post Procedure Diagnosis Same as Pre-procedure Electronic Signature(s) Signed: 12/26/2020 4:47:42 PM By: Phillis HaggisSanchez Pereyda, Dondra PraderKenia RN Entered By: Phillis HaggisSanchez Pereyda, [name] on 12/26/2020 10:51:05 Batchelder, Cecille AverJASON R. ([id number]) -------------------------------------------------------------------------------- Compression Therapy Details Patient Name: Calvin SpencerMONTGOMERY, Ata R. Date of Service: 12/26/2020 10:00 AM Medical Record Number: [id number] Patient Account Number: [id number] Date of Birth/Sex: 12/26/1975 (45 y.o. M) Treating RN: Rogers BlockerSanchez, [name] Primary Care Sidney Silberman: SYSTEM, PCP Other Clinician: Lolita CramBurnette, Kyara Referring [employer] Null: Duwayne Heckogers, Amaziah Treating Christiana Gurevich/Extender: Altamese CarolinaOBSON, MICHAEL G Weeks in Treatment: 1 Compression Therapy Performed for Wound Assessment: Wound #2 Left,Distal Malleolus Performed By: Ovidio Hangerlinician Sanchez, [name], RN Compression Type: Three Layer Pre Treatment ABI: 1.2 Post Procedure Diagnosis Same as Pre-procedure Electronic Signature(s) Signed: 12/26/2020 4:47:42 PM By: Phillis HaggisSanchez Pereyda, Dondra PraderKenia RN Entered By: Phillis HaggisSanchez Pereyda, [name] on 12/26/2020 10:51:06 Simonis, Cecille AverJASON R.  ([id number]) -------------------------------------------------------------------------------- Compression Therapy Details Patient Name: Calvin SpencerMONTGOMERY, Abdulaziz R. Date of Service: 12/26/2020 10:00 AM Medical Record Number: [id number] Patient Account Number: [id number] Date of Birth/Sex: 10/07/1975 (45 y.o. M) Treating RN: Rogers BlockerSanchez, [name] Primary Care Danaly Bari: SYSTEM, PCP Other Clinician: Lolita CramBurnette, Kyara Referring Sanai Frick: Duwayne Heckogers, Joncarlo Treating Shardee Dieu/Extender: Altamese CarolinaOBSON, MICHAEL G Weeks in Treatment: 1 Compression Therapy Performed for Wound Assessment: Wound #3 Left,Posterior Malleolus Performed By: Ovidio Hangerlinician Sanchez, [name], RN Compression Type: Three Layer Pre Treatment ABI: 1.2 Post Procedure Diagnosis Same as Pre-procedure Electronic Signature(s) Signed: 12/26/2020 4:47:42 PM By: Phillis HaggisSanchez Pereyda, Dondra PraderKenia RN Entered By: Phillis HaggisSanchez Pereyda, [name] on 12/26/2020 10:51:06 Heagle, Cecille AverJASON R. ([id number]) -------------------------------------------------------------------------------- Encounter Discharge Information Details Patient Name: Calvin SpencerMONTGOMERY, Tonio R. Date of Service: 12/26/2020 10:00 AM Medical Record Number: [id number] Patient Account Number: [id number] Date of Birth/Sex: 12/15/1975 (45 y.o. M) Treating RN: Hansel FeinsteinBishop, Joy Primary Care Demaris Leavell: SYSTEM, PCP Other Clinician: Lolita CramBurnette, Kyara Referring Lovel Suazo: Duwayne Heckogers, Jermar Treating Jazper Nikolai/Extender: Altamese CarolinaOBSON, MICHAEL G Weeks in Treatment: 1 Encounter Discharge Information Items Discharge Condition: Stable Ambulatory Status: Crutches Discharge Destination: Home Transportation: Private Auto Accompanied By: self Schedule Follow-up Appointment: Yes Clinical Summary of Care: Electronic Signature(s) Signed: 12/27/2020 7:52:35 AM By: Hansel FeinsteinBishop, Joy Entered By: Hansel FeinsteinBishop, Joy on 12/26/2020 11:01:28 Lipuma, Cecille AverJASON R. ([id number]) -------------------------------------------------------------------------------- Lower Extremity Assessment  Details Patient Name: Calvin SpencerMONTGOMERY, Dewitte R. Date of Service: 12/26/2020 10:00 AM Medical Record Number: [id number] Patient Account Number: [id number] Date of Birth/Sex: 11/29/1975 (45 y.o. M) Treating RN: Rogers BlockerSanchez, [name] Primary Care Venissa Nappi: SYSTEM, PCP Other Clinician: Lolita CramBurnette, Kyara Referring Letecia Arps: Duwayne Heckogers, Jaclyn Treating Genise Strack/Extender: Maxwell CaulOBSON, MICHAEL G Weeks in Treatment: 1 Edema Assessment Assessed: [Left: No] [Right: No] [Left: Edema] [Right: :] Calf Left: Right: Point of Measurement: 41 cm From Medial Instep 38 cm Ankle Left: Right: Point of Measurement: 10 cm From Medial Instep 25 cm Vascular Assessment Pulses: Dorsalis Pedis Palpable: [Left:Yes] Electronic Signature(s) Signed: 12/26/2020 4:32:52 PM By: Lolita CramBurnette, Kyara Signed: 12/26/2020 4:47:42 PM By: Phillis HaggisSanchez Pereyda, Dondra PraderKenia RN Entered By: Lolita CramBurnette, Kyara on 12/26/2020 10:16:01 Vanalstine, Cecille AverJASON R. ([id number]) -------------------------------------------------------------------------------- Multi Wound Chart Details Patient Name: Calvin SpencerMONTGOMERY, Hobie R. Date of Service: 12/26/2020 10:00 AM Medical Record Number: [id number] Patient Account Number: [id number] Date of Birth/Sex: 06/02/1976 (45 y.o. M) Treating RN: Rogers BlockerSanchez, [name] Primary Care Augustine Leverette: SYSTEM, PCP Other Clinician: Lolita CramBurnette, Kyara Referring Arva Slaugh: Duwayne Heckogers, Cadell Treating Marieme Mcmackin/Extender: Maxwell CaulOBSON, MICHAEL G  Weeks in Treatment: 1 Vital Signs Height(in): 73 Pulse(bpm): 98 Weight(lbs): 260 Blood Pressure(mmHg): 134/85 Body Mass Index(BMI): 34 Temperature(F): 97.9 Respiratory Rate(breaths/min): 18 Photos: Wound Location: Left, Proximal Malleolus Left, Distal Malleolus Left, Posterior Malleolus Wounding Event: Trauma Trauma Trauma Primary Etiology: Trauma, Other Trauma, Other Trauma, Other Date Acquired: 11/19/2020 11/19/2020 11/19/2020 Weeks of Treatment: 1 1 1  Wound Status: Open Open Open Measurements L x W x D (cm) 0.9x0.4x0.1 0.7x0.4x0.1  0.1x0.1x0.1 Area (cm) : 0.283 0.22 0.008 Volume (cm) : 0.028 0.022 0.001 % Reduction in Area: 48.50% 94.40% 98.90% % Reduction in Volume: 49.10% 94.40% 98.60% Classification: Full Thickness Without Exposed Full Thickness Without Exposed Full Thickness Without Exposed Support Structures Support Structures Support Structures Exudate Amount: Medium Small None Present Exudate Type: Serosanguineous Serosanguineous [name] Exudate Color: red, brown red, brown [name] Granulation Amount: Large (67-100%) Medium (34-66%) None Present (0%) Granulation Quality: Red, Pink Red, Pink [name] Necrotic Amount: None Present (0%) Small (1-33%) None Present (0%) Exposed Structures: Fat Layer (Subcutaneous Tissue): Fat Layer (Subcutaneous Tissue): Fascia: No Yes Yes Fat Layer (Subcutaneous Tissue): Fascia: No Fascia: No No Tendon: No Tendon: No Tendon: No Muscle: No Muscle: No Muscle: No Joint: No Joint: No Joint: No Bone: No Bone: No Bone: No Procedures Performed: CHEM CAUT GRANULATION TISS CHEM CAUT GRANULATION TISS Compression Therapy Compression Therapy Compression Therapy Treatment Notes Wound #1 (Malleolus) Wound Laterality: Left, Proximal Cleanser Peri-Wound Care Topical Primary Dressing Hydrofera Blue Ready Transfer Foam, 2.5x2.5 (in/in) MOISES, TERPSTRA ([name]) Discharge Instruction: Apply Hydrofera Blue Ready to wound bed as directed Secondary Dressing ABD Pad 5x9 (in/in) Discharge Instruction: Cover with ABD pad Secured With Compression Wrap Profore Lite LF 3 Multilayer Compression Bandaging System Discharge Instruction: Apply 3 multi-layer wrap as prescribed. Compression Stockings Add-Ons Wound #2 (Malleolus) Wound Laterality: Left, Distal Cleanser Peri-Wound Care Topical Primary Dressing Hydrofera Blue Ready Transfer Foam, 2.5x2.5 (in/in) Discharge Instruction: Apply Hydrofera Blue Ready to wound bed as directed Secondary Dressing ABD Pad 5x9 (in/in) Discharge  Instruction: Cover with ABD pad Secured With Compression Wrap Profore Lite LF 3 Multilayer Compression Bandaging System Discharge Instruction: Apply 3 multi-layer wrap as prescribed. Compression Stockings Add-Ons Wound #3 (Malleolus) Wound Laterality: Left, Posterior Cleanser Peri-Wound Care Topical Primary Dressing Hydrofera Blue Ready Transfer Foam, 2.5x2.5 (in/in) Discharge Instruction: Apply Hydrofera Blue Ready to wound bed as directed Secondary Dressing ABD Pad 5x9 (in/in) Discharge Instruction: Cover with ABD pad Secured With Compression Wrap Profore Lite LF 3 Multilayer Compression Bandaging System Discharge Instruction: Apply 3 multi-layer wrap as prescribed. Compression Stockings Add-Ons IZEKIEL, FLEGEL ([name]) Electronic Signature(s) Signed: 12/26/2020 5:03:12 PM By: 12/28/2020 MD Entered By: Baltazar Najjar on 12/26/2020 12:12:32 COLSON, BARCO ([name]) -------------------------------------------------------------------------------- Multi-Disciplinary Care Plan Details Patient Name: CONOR, LATA. Date of Service: 12/26/2020 10:00 AM Medical Record Number: 12/28/2020 Patient Account Number: [id number] Date of Birth/Sex: 01-03-1976 (45 y.o. M) Treating RN: 49 Primary Care Zurii Hewes: SYSTEM, PCP Other Clinician: Rogers Blocker Referring Ezrie Bunyan: Lolita Cram Treating Vi Whitesel/Extender: Duwayne Heck in Treatment: 1 Active Inactive Abuse / Safety / Falls / Self Care Management Nursing Diagnoses: History of Falls Goals: Patient will not experience any injury related to falls Date Initiated: 12/19/2020 Target Resolution Date: 12/19/2020 Goal Status: Active Patient/caregiver will verbalize understanding of skin care regimen Date Initiated: 12/19/2020 Target Resolution Date: 12/19/2020 Goal Status: Active Interventions: Provide education on fall prevention Provide education on personal and home safety Notes: Pain,  Acute or Chronic Nursing Diagnoses: Pain, acute or chronic: actual or potential Goals: Patient will  verbalize adequate pain control and receive pain control interventions during procedures as needed Date Initiated: 12/19/2020 Target Resolution Date: 12/19/2020 Goal Status: Active Interventions: Assess comfort goal upon admission Treatment Activities: Administer pain control measures as ordered : 12/19/2020 Notes: Wound/Skin Impairment Nursing Diagnoses: Impaired tissue integrity Goals: Patient/caregiver will verbalize understanding of skin care regimen Date Initiated: 12/19/2020 Target Resolution Date: 12/19/2020 Goal Status: Active Ulcer/skin breakdown will have a volume reduction of 30% by week 4 Date Initiated: 12/19/2020 Target Resolution Date: 01/18/2021 Goal Status: Active Interventions: Assess ulceration(s) every visit Treatment Activities: Skin care regimen initiated : 12/19/2020 GEREMY, RISTER ([id number]) Topical wound management initiated : 12/19/2020 Notes: Electronic Signature(s) Signed: 12/26/2020 4:47:42 PM By: Phillis Haggis, Dondra Prader RN Entered By: Phillis Haggis, Dondra Prader on 12/26/2020 10:46:18 Waitman, Cecille Aver ([id number]) -------------------------------------------------------------------------------- Pain Assessment Details Patient Name: [name] Date of Service: 12/26/2020 10:00 AM Medical Record Number: [id number] Patient Account Number: [id number] Date of Birth/Sex: 1976-01-31 (45 y.o. M) Treating RN: Rogers Blocker Primary Care Chandria Rookstool: SYSTEM, PCP Other Clinician: Lolita Cram Referring Kiesha Ensey: Duwayne Heck Treating Bessye Stith/Extender: Altamese [place] in Treatment: 1 Active Problems Location of Pain Severity and Description of Pain Patient Has Paino No Site Locations Rate the pain. Current Pain Level: 0 Pain Management and Medication Current Pain Management: Electronic Signature(s) Signed: 12/26/2020 4:32:52 PM By: Lolita Cram Signed: 12/26/2020 4:47:42 PM By: Phillis Haggis, Dondra Prader RN Entered By: Lolita Cram on 12/26/2020 09:59:36 Chait, Cecille Aver ([id number]) -------------------------------------------------------------------------------- Patient/Caregiver Education Details Patient Name: [name] Date of Service: 12/26/2020 10:00 AM Medical Record Number: [id number] Patient Account Number: [id number] Date of Birth/Gender: 1976/09/13 (45 y.o. M) Treating RN: Rogers Blocker Primary Care Physician: SYSTEM, PCP Other Clinician: Lolita Cram Referring Physician: Duwayne Heck Treating Physician/Extender: Altamese [place] in Treatment: 1 Education Assessment Education Provided To: Patient Education Topics Provided Wound/Skin Impairment: Methods: Explain/Verbal Responses: State content correctly Electronic Signature(s) Signed: 12/26/2020 4:47:42 PM By: Phillis Haggis, Dondra Prader RN Entered By: Phillis Haggis, [name] on 12/26/2020 10:51:33 Axton, Cecille Aver ([id number]) -------------------------------------------------------------------------------- Wound Assessment Details Patient Name: [name] Date of Service: 12/26/2020 10:00 AM Medical Record Number: [id number] Patient Account Number: [id number] Date of Birth/Sex: 02/04/1976 (45 y.o. M) Treating RN: Rogers Blocker Primary Care Jazz Rogala: SYSTEM, PCP Other Clinician: Lolita Cram Referring Ifeoluwa Beller: Duwayne Heck Treating Tasnim Balentine/Extender: Altamese [place] in Treatment: 1 Wound Status Wound Number: 1 Primary Etiology: Trauma, Other Wound Location: Left, Proximal Malleolus Wound Status: Open Wounding Event: Trauma Date Acquired: 11/19/2020 Weeks Of Treatment: 1 Clustered Wound: No Photos Wound Measurements Length: (cm) 0.9 Width: (cm) 0.4 Depth: (cm) 0.1 Area: (cm) 0.283 Volume: (cm) 0.028 % Reduction in Area: 48.5% % Reduction in Volume: 49.1% Tunneling: No Undermining: No Wound  Description Classification: Full Thickness Without Exposed Support Structu Exudate Amount: Medium Exudate Type: Serosanguineous Exudate Color: red, brown res Foul Odor After Cleansing: No Slough/Fibrino No Wound Bed Granulation Amount: Large (67-100%) Exposed Structure Granulation Quality: Red, Pink Fascia Exposed: No Necrotic Amount: None Present (0%) Fat Layer (Subcutaneous Tissue) Exposed: Yes Tendon Exposed: No Muscle Exposed: No Joint Exposed: No Bone Exposed: No Treatment Notes Wound #1 (Malleolus) Wound Laterality: Left, Proximal Cleanser Peri-Wound Care Topical Primary Dressing BENTLEIGH, STANKUS ([id number]) Hydrofera Blue Ready Transfer Foam, 2.5x2.5 (in/in) Discharge Instruction: Apply Hydrofera Blue Ready to wound bed as directed Secondary Dressing ABD Pad 5x9 (in/in) Discharge Instruction: Cover with ABD pad Secured With Compression Wrap Profore Lite LF 3 Multilayer Compression Bandaging System Discharge Instruction: Apply 3 multi-layer wrap as prescribed.  Compression Stockings Add-Ons Electronic Signature(s) Signed: 12/26/2020 4:32:52 PM By: Lolita Cram Signed: 12/26/2020 4:47:42 PM By: Phillis Haggis, Dondra Prader RN Entered By: Lolita Cram on 12/26/2020 10:13:25 Diffley, Cecille Aver ([id number]) -------------------------------------------------------------------------------- Wound Assessment Details Patient Name: [name] Date of Service: 12/26/2020 10:00 AM Medical Record Number: [id number] Patient Account Number: [id number] Date of Birth/Sex: [birth year] (45 y.o. M) Treating RN: Rogers Blocker Primary Care Mikaella Escalona: SYSTEM, PCP Other Clinician: Lolita Cram Referring Jerney Baksh: Duwayne Heck Treating Wasil Wolke/Extender: Altamese [place] in Treatment: 1 Wound Status Wound Number: 2 Primary Etiology: Trauma, Other Wound Location: Left, Distal Malleolus Wound Status: Open Wounding Event: Trauma Date Acquired: 11/19/2020 Weeks Of  Treatment: 1 Clustered Wound: No Photos Wound Measurements Length: (cm) 0.7 Width: (cm) 0.4 Depth: (cm) 0.1 Area: (cm) 0.22 Volume: (cm) 0.022 % Reduction in Area: 94.4% % Reduction in Volume: 94.4% Tunneling: No Undermining: No Wound Description Classification: Full Thickness Without Exposed Support Structu Exudate Amount: Small Exudate Type: Serosanguineous Exudate Color: red, brown res Foul Odor After Cleansing: No Slough/Fibrino Yes Wound Bed Granulation Amount: Medium (34-66%) Exposed Structure Granulation Quality: Red, Pink Fascia Exposed: No Necrotic Amount: Small (1-33%) Fat Layer (Subcutaneous Tissue) Exposed: Yes Necrotic Quality: Adherent Slough Tendon Exposed: No Muscle Exposed: No Joint Exposed: No Bone Exposed: No Treatment Notes Wound #2 (Malleolus) Wound Laterality: Left, Distal Cleanser Peri-Wound Care Topical Primary Dressing TREVEL, DILLENBECK ([id number]) Hydrofera Blue Ready Transfer Foam, 2.5x2.5 (in/in) Discharge Instruction: Apply Hydrofera Blue Ready to wound bed as directed Secondary Dressing ABD Pad 5x9 (in/in) Discharge Instruction: Cover with ABD pad Secured With Compression Wrap Profore Lite LF 3 Multilayer Compression Bandaging System Discharge Instruction: Apply 3 multi-layer wrap as prescribed. Compression Stockings Add-Ons Electronic Signature(s) Signed: 12/26/2020 4:32:52 PM By: Lolita Cram Signed: 12/26/2020 4:47:42 PM By: Phillis Haggis, Dondra Prader RN Entered By: Lolita Cram on 12/26/2020 10:14:03 Heyer, Cecille Aver ([id number]) -------------------------------------------------------------------------------- Wound Assessment Details Patient Name: [name] Date of Service: 12/26/2020 10:00 AM Medical Record Number: [id number] Patient Account Number: [id number] Date of Birth/Sex: 1976/01/16 (45 y.o. M) Treating RN: Rogers Blocker Primary Care Aeron Lheureux: SYSTEM, PCP Other Clinician: Lolita Cram Referring  Caster Fayette: Duwayne Heck Treating Phelan Schadt/Extender: Altamese  in Treatment: 1 Wound Status Wound Number: 3 Primary Etiology: Trauma, Other Wound Location: Left, Posterior Malleolus Wound Status: Open Wounding Event: Trauma Date Acquired: 11/19/2020 Weeks Of Treatment: 1 Clustered Wound: No Photos Wound Measurements Length: (cm) 0.1 Width: (cm) 0.1 Depth: (cm) 0.1 Area: (cm) 0.008 Volume: (cm) 0.001 % Reduction in Area: 98.9% % Reduction in Volume: 98.6% Tunneling: No Undermining: No Wound Description Classification: Full Thickness Without Exposed Support Structure Exudate Amount: None Present s Foul Odor After Cleansing: No Slough/Fibrino No Wound Bed Granulation Amount: None Present (0%) Exposed Structure Necrotic Amount: None Present (0%) Fascia Exposed: No Fat Layer (Subcutaneous Tissue) Exposed: No Tendon Exposed: No Muscle Exposed: No Joint Exposed: No Bone Exposed: No Treatment Notes Wound #3 (Malleolus) Wound Laterality: Left, Posterior Cleanser Peri-Wound Care Topical Primary Dressing Hydrofera Blue Ready Transfer Foam, 2.5x2.5 (in/in) CHARLIS, HARNER ([id number]) Discharge Instruction: Apply Hydrofera Blue Ready to wound bed as directed Secondary Dressing ABD Pad 5x9 (in/in) Discharge Instruction: Cover with ABD pad Secured With Compression Wrap Profore Lite LF 3 Multilayer Compression Bandaging System Discharge Instruction: Apply 3 multi-layer wrap as prescribed. Compression Stockings Add-Ons Electronic Signature(s) Signed: 12/26/2020 4:32:52 PM By: Lolita Cram Signed: 12/26/2020 4:47:42 PM By: Phillis Haggis, Dondra Prader RN Entered By: Lolita Cram on 12/26/2020 10:14:46 Klammer, Cecille Aver ([id number]) -------------------------------------------------------------------------------- Vitals Details Patient  Name: DEVAUGHN, SAVANT. Date of Service: 12/26/2020 10:00 AM Medical Record Number: [id number] Patient Account Number:  [id number] Date of Birth/Sex: 04-04-1976 (45 y.o. M) Treating RN: Rogers Blocker Primary Care Ashlin Kreps: SYSTEM, PCP Other Clinician: Lolita Cram Referring Elysha Daw: Duwayne Heck Treating Kyri Shader/Extender: Altamese [place] in Treatment: 1 Vital Signs Time Taken: 09:55 Temperature (F): 97.9 Height (in): 73 Pulse (bpm): 98 Weight (lbs): 260 Respiratory Rate (breaths/min): 18 Body Mass Index (BMI): 34.3 Blood Pressure (mmHg): 134/85 Reference Range: 80 - 120 mg / dl Electronic Signature(s) Signed: 12/26/2020 4:32:52 PM By: Lolita Cram Entered By: Lolita Cram on 12/26/2020 09:59:30

## 2021-01-02 ENCOUNTER — Other Ambulatory Visit: Payer: Self-pay

## 2021-01-02 DIAGNOSIS — L97328 Non-pressure chronic ulcer of left ankle with other specified severity: Secondary | ICD-10-CM | POA: Diagnosis not present

## 2021-01-02 NOTE — Progress Notes (Signed)
[name], [name] ([id number]) Visit Report for 01/02/2021 Arrival Information Details Patient Name: [name] Date of Service: 01/02/2021 11:00 AM Medical Record Number: [id number] Patient Account Number: [id number] Date of Birth/Sex: 07/29/1976 (45 y.o. M) Treating RN: Hansel Feinstein Primary Care Latonyia Lopata: SYSTEM, PCP Other Clinician: Lolita Cram Referring Shirelle Tootle: Duwayne Heck Treating Yailin Biederman/Extender: Rowan Blase in Treatment: 2 Visit Information History Since Last Visit Added or deleted any medications: No Patient Arrived: Ambulatory Had a fall or experienced change in No Arrival Time: 10:56 activities of daily living that may affect Accompanied By: self risk of falls: Transfer Assistance: None Hospitalized since last visit: No Patient Identification Verified: Yes Has Dressing in Place as Prescribed: Yes Secondary Verification Process Completed: Yes Has Compression in Place as Prescribed: Yes Patient Has Alerts: Yes Pain Present Now: Yes Patient Alerts: NOT DIABETIC Electronic Signature(s) Signed: 01/02/2021 4:31:23 PM By: Hansel Feinstein Entered By: Hansel Feinstein on 01/02/2021 10:56:56 Monroy, Cecille Aver ([id number]) -------------------------------------------------------------------------------- Clinic Level of Care Assessment Details Patient Name: [name] Date of Service: 01/02/2021 11:00 AM Medical Record Number: [id number] Patient Account Number: [id number] Date of Birth/Sex: December 20, 1975 (45 y.o. M) Treating RN: Hansel Feinstein Primary Care Phala Schraeder: SYSTEM, PCP Other Clinician: Lolita Cram Referring Delonna Ney: Duwayne Heck Treating Zela Sobieski/Extender: Rowan Blase in Treatment: 2 Clinic Level of Care Assessment Items TOOL 1 Quantity Score []  - Use when EandM and Procedure is performed on INITIAL visit 0 ASSESSMENTS - Nursing Assessment / Reassessment []  - General Physical Exam (combine w/ comprehensive assessment (listed just  below) when performed on new 0 pt. evals) []  - 0 Comprehensive Assessment (HX, ROS, Risk Assessments, Wounds Hx, etc.) ASSESSMENTS - Wound and Skin Assessment / Reassessment []  - Dermatologic / Skin Assessment (not related to wound area) 0 ASSESSMENTS - Ostomy and/or Continence Assessment and Care []  - Incontinence Assessment and Management 0 []  - 0 Ostomy Care Assessment and Management (repouching, etc.) PROCESS - Coordination of Care []  - Simple Patient / Family Education for ongoing care 0 []  - 0 Complex (extensive) Patient / Family Education for ongoing care []  - 0 Staff obtains , Records, Test Results / Process Orders []  - 0 Staff telephones HHA, Nursing Homes / Clarify orders / etc []  - 0 Routine Transfer to another Facility (non-emergent condition) []  - 0 Routine Hospital Admission (non-emergent condition) []  - 0 New Admissions / / Ordering NPWT, Apligraf, etc. []  - 0 Emergency Hospital Admission (emergent condition) PROCESS - Special Needs []  - Pediatric / Minor Patient Management 0 []  - 0 Isolation Patient Management []  - 0 Hearing / Language / Visual special needs []  - 0 Assessment of Community assistance (transportation, D/C planning, etc.) []  - 0 Additional assistance / Altered mentation []  - 0 Support Surface(s) Assessment (bed, cushion, seat, etc.) INTERVENTIONS - Miscellaneous []  - External ear exam 0 []  - 0 Patient Transfer (multiple staff / / Similar devices) []  - 0 Simple Staple / Suture removal (25 or less) []  - 0 Complex Staple / Suture removal (26 or more) []  - 0 Hypo/Hyperglycemic Management (do not check if billed separately) []  - 0 Ankle / Brachial Index (ABI) - do not check if billed separately Has the patient been seen at the hospital within the last three years: Yes Total Score: 0 Level Of Care: ____ ( ) Electronic Signature(s) Signed: 01/02/2021 4:31:23 PM By:  Entered By: on 01/02/2021 11:10:11 Yogi, Chiropractor ( ) -------------------------------------------------------------------------------- Compression Therapy Details Patient Name: ,  Calvin R. Date of Service: 01/02/2021 11:00 AM Medical Record Number: [id number] Patient Account Number: [id number] Date of Birth/Sex: 1976/01/04 (45 y.o. M) Treating RN: Hansel Feinstein Primary Care Anye Brose: SYSTEM, PCP Other Clinician: Lolita Cram Referring Chrystle Murillo: Duwayne Heck Treating Bellah Alia/Extender: Rowan Blase in Treatment: 2 Compression Therapy Performed for Wound Assessment: Wound #2 Left,Distal Malleolus Performed By: Clinician Hansel Feinstein, RN Compression Type: Three Layer Electronic Signature(s) Signed: 01/02/2021 4:31:23 PM By: Hansel Feinstein Entered By: Hansel Feinstein on 01/02/2021 11:03:11 Balbach, Cecille Aver ([id number]) -------------------------------------------------------------------------------- Compression Therapy Details Patient Name: [name] Date of Service: 01/02/2021 11:00 AM Medical Record Number: [id number] Patient Account Number: [id number] Date of Birth/Sex: 18-Jan-1976 (45 y.o. M) Treating RN: Hansel Feinstein Primary Care Lelah Rennaker: SYSTEM, PCP Other Clinician: Lolita Cram Referring Delonta Yohannes: Duwayne Heck Treating Prentice Sackrider/Extender: Rowan Blase in Treatment: 2 Compression Therapy Performed for Wound Assessment: Wound #1 Left,Proximal Malleolus Performed By: Shawn Route, RN Compression Type: Three Layer Electronic Signature(s) Signed: 01/02/2021 4:31:23 PM By: Hansel Feinstein Entered By: Hansel Feinstein on 01/02/2021 11:03:11 Dupriest, Cecille Aver ([id number]) -------------------------------------------------------------------------------- Compression Therapy Details Patient Name: [name] Date of Service: 01/02/2021 11:00 AM Medical Record Number: [id number] Patient Account Number:  [id number] Date of Birth/Sex: September 13, 1976 (45 y.o. M) Treating RN: Hansel Feinstein Primary Care Natally Ribera: SYSTEM, PCP Other Clinician: Lolita Cram Referring Tykeria Wawrzyniak: Duwayne Heck Treating Aleyda Gindlesperger/Extender: Rowan Blase in Treatment: 2 Compression Therapy Performed for Wound Assessment: Wound #3 Left,Posterior Malleolus Performed By: Shawn Route, RN Compression Type: Three Layer Electronic Signature(s) Signed: 01/02/2021 4:31:23 PM By: Hansel Feinstein Entered By: Hansel Feinstein on 01/02/2021 11:03:12 Ramos, Cecille Aver ([id number]) -------------------------------------------------------------------------------- Encounter Discharge Information Details Patient Name: [name] Date of Service: 01/02/2021 11:00 AM Medical Record Number: [id number] Patient Account Number: [id number] Date of Birth/Sex: 23-Feb-1976 (45 y.o. M) Treating RN: Hansel Feinstein Primary Care Ada Holness: SYSTEM, PCP Other Clinician: Lolita Cram Referring Verdene Creson: Duwayne Heck Treating Camden Knotek/Extender: Rowan Blase in Treatment: 2 Encounter Discharge Information Items Discharge Condition: Stable Ambulatory Status: Ambulatory Discharge Destination: Home Transportation: Private Auto Accompanied By: self Schedule Follow-up Appointment: Yes Clinical Summary of Care: Electronic Signature(s) Signed: 01/02/2021 4:31:23 PM By: Hansel Feinstein Entered By: Hansel Feinstein on 01/02/2021 11:10:06 Enderle, Cecille Aver ([id number]) -------------------------------------------------------------------------------- Wound Assessment Details Patient Name: [name] Date of Service: 01/02/2021 11:00 AM Medical Record Number: [id number] Patient Account Number: [id number] Date of Birth/Sex: [birth year] (45 y.o. M) Treating RN: Hansel Feinstein Primary Care Consandra Laske: SYSTEM, PCP Other Clinician: Lolita Cram Referring Wofford Stratton: Duwayne Heck Treating Petula Rotolo/Extender: Rowan Blase in Treatment: 2 Wound  Status Wound Number: 1 Primary Etiology: Trauma, Other Wound Location: Left, Proximal Malleolus Wound Status: Open Wounding Event: Trauma Date Acquired: 11/19/2020 Weeks Of Treatment: 2 Clustered Wound: No Wound Measurements Length: (cm) 0.9 Width: (cm) 0.4 Depth: (cm) 0.1 Area: (cm) 0.283 Volume: (cm) 0.028 % Reduction in Area: 48.5% % Reduction in Volume: 49.1% Wound Description Classification: Full Thickness Without Exposed Support Structure s Treatment Notes Wound #1 (Malleolus) Wound Laterality: Left, Proximal Cleanser Peri-Wound Care Topical Primary Dressing Hydrofera Blue Ready Transfer Foam, 2.5x2.5 (in/in) Discharge Instruction: Apply Hydrofera Blue Ready to wound bed as directed Secondary Dressing ABD Pad 5x9 (in/in) Discharge Instruction: Cover with ABD pad Secured With Compression Wrap Profore Lite LF 3 Multilayer Compression Bandaging System Discharge Instruction: Apply 3 multi-layer wrap as prescribed. Compression Stockings Add-Ons Electronic Signature(s) Signed: 01/02/2021 4:31:23 PM By: Hansel Feinstein Entered By: Hansel Feinstein on 01/02/2021 11:02:48 Garrison, Cecille Aver ([id number]) -------------------------------------------------------------------------------- Wound Assessment Details Patient Name: Ericka Pontiff,  Rolin R. Date of Service: 01/02/2021 11:00 AM Medical Record Number: [id number] Patient Account Number: [id number] Date of Birth/Sex: 24-Oct-1975 (45 y.o. M) Treating RN: Hansel Feinstein Primary Care Lynsee Wands: SYSTEM, PCP Other Clinician: Lolita Cram Referring Marquavious Nazar: Duwayne Heck Treating Vamsi Apfel/Extender: Rowan Blase in Treatment: 2 Wound Status Wound Number: 2 Primary Etiology: Trauma, Other Wound Location: Left, Distal Malleolus Wound Status: Open Wounding Event: Trauma Date Acquired: 11/19/2020 Weeks Of Treatment: 2 Clustered Wound: No Wound Measurements Length: (cm) 0.7 Width: (cm) 0.4 Depth: (cm) 0.1 Area: (cm)  0.22 Volume: (cm) 0.022 % Reduction in Area: 94.4% % Reduction in Volume: 94.4% Wound Description Classification: Full Thickness Without Exposed Support Structure s Treatment Notes Wound #2 (Malleolus) Wound Laterality: Left, Distal Cleanser Peri-Wound Care Topical Primary Dressing Hydrofera Blue Ready Transfer Foam, 2.5x2.5 (in/in) Discharge Instruction: Apply Hydrofera Blue Ready to wound bed as directed Secondary Dressing ABD Pad 5x9 (in/in) Discharge Instruction: Cover with ABD pad Secured With Compression Wrap Profore Lite LF 3 Multilayer Compression Bandaging System Discharge Instruction: Apply 3 multi-layer wrap as prescribed. Compression Stockings Add-Ons Electronic Signature(s) Signed: 01/02/2021 4:31:23 PM By: Hansel Feinstein Entered By: Hansel Feinstein on 01/02/2021 11:02:48 Copelin, Cecille Aver ([id number]) -------------------------------------------------------------------------------- Wound Assessment Details Patient Name: [name] Date of Service: 01/02/2021 11:00 AM Medical Record Number: [id number] Patient Account Number: [id number] Date of Birth/Sex: Jan 14, 1976 (45 y.o. M) Treating RN: Hansel Feinstein Primary Care Kaede Clendenen: SYSTEM, PCP Other Clinician: Lolita Cram Referring Verdell Dykman: Duwayne Heck Treating Pollyanna Levay/Extender: Rowan Blase in Treatment: 2 Wound Status Wound Number: 3 Primary Etiology: Trauma, Other Wound Location: Left, Posterior Malleolus Wound Status: Open Wounding Event: Trauma Date Acquired: 11/19/2020 Weeks Of Treatment: 2 Clustered Wound: No Wound Measurements Length: (cm) 0.1 Width: (cm) 0.1 Depth: (cm) 0.1 Area: (cm) 0.008 Volume: (cm) 0.001 % Reduction in Area: 98.9% % Reduction in Volume: 98.6% Wound Description Classification: Full Thickness Without Exposed Support Structure s Treatment Notes Wound #3 (Malleolus) Wound Laterality: Left, Posterior Cleanser Peri-Wound Care Topical Primary  Dressing Hydrofera Blue Ready Transfer Foam, 2.5x2.5 (in/in) Discharge Instruction: Apply Hydrofera Blue Ready to wound bed as directed Secondary Dressing ABD Pad 5x9 (in/in) Discharge Instruction: Cover with ABD pad Secured With Compression Wrap Profore Lite LF 3 Multilayer Compression Bandaging System Discharge Instruction: Apply 3 multi-layer wrap as prescribed. Compression Stockings Add-Ons Electronic Signature(s) Signed: 01/02/2021 4:31:23 PM By: Hansel Feinstein Entered ByHansel Feinstein on 01/02/2021 11:02:48

## 2021-01-09 ENCOUNTER — Encounter: Payer: Worker's Compensation | Admitting: Internal Medicine

## 2021-01-09 ENCOUNTER — Other Ambulatory Visit: Payer: Self-pay

## 2021-01-09 DIAGNOSIS — L97328 Non-pressure chronic ulcer of left ankle with other specified severity: Secondary | ICD-10-CM | POA: Diagnosis not present

## 2021-01-09 NOTE — Progress Notes (Signed)
[name], Parv R. ([id number]) Visit Report for 01/09/2021 Arrival Information Details Patient Name: [name], Calvin R. Date of Service: 01/09/2021 1:30 PM Medical Record Number: [id number] Patient Account Number: [id number] Date of Birth/Sex: 05/24/1976 (45 y.o. M) Treating RN: Hansel FeinsteinBishop, Joy Primary Care Lakeyta Vandenheuvel: SYSTEM, PCP Other Clinician: Lolita CramBurnette, Kyara Referring Ezma Rehm: Duwayne Heckogers, Odean Treating Kiki Bivens/Extender: Altamese CarolinaOBSON, MICHAEL G Weeks in Treatment: 3 Visit Information History Since Last Visit Added or deleted any medications: No Patient Arrived: Ambulatory Had a fall or experienced change in No Arrival Time: 13:34 activities of daily living that may affect Accompanied By: self risk of falls: Transfer Assistance: None Hospitalized since last visit: No Patient Identification Verified: Yes Has Dressing in Place as Prescribed: Yes Secondary Verification Process Completed: Yes Pain Present Now: Yes Patient Has Alerts: Yes Patient Alerts: NOT DIABETIC Electronic Signature(s) Signed: 01/09/2021 2:06:04 PM By: Hansel FeinsteinBishop, Joy Entered By: Hansel FeinsteinBishop, Joy on 01/09/2021 13:34:49 [name] AverJASON R. ([id number]) -------------------------------------------------------------------------------- Encounter Discharge Information Details Patient Name: [name], Calvin R. Date of Service: 01/09/2021 1:30 PM Medical Record Number: [id number] Patient Account Number: [id number] Date of Birth/Sex: 01/14/1976 (45 y.o. M) Treating RN: Rogers BlockerSanchez, [name] Primary Care Nikyah Lackman: SYSTEM, PCP Other Clinician: Lolita CramBurnette, Kyara Referring Lorretta Kerce: Duwayne Heckogers, Wentworth Treating Ginger Leeth/Extender: Altamese CarolinaOBSON, MICHAEL G Weeks in Treatment: 3 Encounter Discharge Information Items Post Procedure Vitals Discharge Condition: Stable Temperature (F): 98 Ambulatory Status: Ambulatory Pulse (bpm): 85 Discharge Destination: Home Respiratory Rate (breaths/min): 16 Transportation: Private Auto Blood Pressure (mmHg):  146/88 Accompanied By: self Schedule Follow-up Appointment: Yes Clinical Summary of Care: Electronic Signature(s) Signed: 01/09/2021 3:52:05 PM By: Phillis HaggisSanchez Pereyda, Dondra PraderKenia RN Entered By: Phillis HaggisSanchez Pereyda, [name] on 01/09/2021 14:18:27 [name], [name] ([id number]) -------------------------------------------------------------------------------- Lower Extremity Assessment Details Patient Name: [name], Calvin R. Date of Service: 01/09/2021 1:30 PM Medical Record Number: [id number] Patient Account Number: [id number] Date of Birth/Sex: 02/27/1976 (45 y.o. M) Treating RN: Hansel FeinsteinBishop, Joy Primary Care Sharelle Burditt: SYSTEM, PCP Other Clinician: Lolita CramBurnette, Kyara Referring Keaundra Stehle: Duwayne Heckogers, Taeshaun Treating Lavonia Eager/Extender: Altamese CarolinaOBSON, MICHAEL G Weeks in Treatment: 3 Edema Assessment Assessed: [Left: Yes] [Right: No] [Left: Edema] [Right: :] Calf Left: Right: Point of Measurement: 38 cm From Medial Instep 42 cm Ankle Left: Right: Point of Measurement: 10 cm From Medial Instep 26 cm Knee To Floor Left: Right: From Medial Instep 42 cm Vascular Assessment Pulses: Dorsalis Pedis Palpable: [Left:Yes] Electronic Signature(s) Signed: 01/09/2021 2:06:04 PM By: Hansel FeinsteinBishop, Joy Entered By: Hansel FeinsteinBishop, Joy on 01/09/2021 13:47:28 [name], [name] ([id number]) -------------------------------------------------------------------------------- Multi Wound Chart Details Patient Name: [name], Calvin R. Date of Service: 01/09/2021 1:30 PM Medical Record Number: [id number] Patient Account Number: [id number] Date of Birth/Sex: 08/13/1976 (45 y.o. M) Treating RN: Huel CoventryWoody, Kim Primary Care Nashira Mcglynn: SYSTEM, PCP Other Clinician: Lolita CramBurnette, Kyara Referring Paraskevi Funez: Duwayne Heckogers, Ezell Treating Sloan Takagi/Extender: Altamese CarolinaOBSON, MICHAEL G Weeks in Treatment: 3 Vital Signs Height(in): 73 Pulse(bpm): 85 Weight(lbs): 260 Blood Pressure(mmHg): 146/88 Body Mass Index(BMI): 34 Temperature(F): 98 Respiratory Rate(breaths/min):  16 Photos: Wound Location: Left, Proximal Malleolus Left, Distal Malleolus Left, Posterior Malleolus Wounding Event: Trauma Trauma Trauma Primary Etiology: Trauma, Other Trauma, Other Trauma, Other Date Acquired: 11/19/2020 11/19/2020 11/19/2020 Weeks of Treatment: 3 3 3  Wound Status: Open Open Healed - Epithelialized Measurements L x W x D (cm) 0.7x0.3x0.1 0.5x0.3x0.1 0x0x0 Area (cm) : 0.165 0.118 0 Volume (cm) : 0.016 0.012 0 % Reduction in Area: 70.00% 97.00% 100.00% % Reduction in Volume: 70.90% 96.90% 100.00% Classification: Full Thickness Without Exposed Full Thickness Without Exposed Full Thickness Without Exposed Support Structures Support Structures Support Structures Exudate Amount: None Present N/A None Present Granulation Amount: None  Present (0%) None Present (0%) N/A Necrotic Amount: Large (67-100%) Large (67-100%) N/A Necrotic Tissue: Eschar Eschar Eschar Exposed Structures: Fat Layer (Subcutaneous Tissue): Fat Layer (Subcutaneous Tissue): Fat Layer (Subcutaneous Tissue): Yes Yes Yes Fascia: No Fascia: No Fascia: No Tendon: No Tendon: No Tendon: No Muscle: No Muscle: No Muscle: No Joint: No Joint: No Joint: No Bone: No Bone: No Bone: No Epithelialization: N/A N/A Large (67-100%) Treatment Notes Electronic Signature(s) Signed: 01/09/2021 5:04:28 PM By: Baltazar Najjar MD Entered By: Baltazar Najjar on 01/09/2021 13:58:37 [name] ([id number]) -------------------------------------------------------------------------------- Multi-Disciplinary Care Plan Details Patient Name: Calvin Spencer. Date of Service: 01/09/2021 1:30 PM Medical Record Number: [id number] Patient Account Number: [id number] Date of Birth/Sex: 14-Aug-1976 (45 y.o. M) Treating RN: Huel Coventry Primary Care Hanni Milford: SYSTEM, PCP Other Clinician: Lolita Cram Referring Jericha Bryden: Duwayne Heck Treating Almus Woodham/Extender: Altamese [place] in Treatment: 3 Active  Inactive Abuse / Safety / Falls / Self Care Management Nursing Diagnoses: History of Falls Goals: Patient will not experience any injury related to falls Date Initiated: 12/19/2020 Target Resolution Date: 12/19/2020 Goal Status: Active Patient/caregiver will verbalize understanding of skin care regimen Date Initiated: 12/19/2020 Target Resolution Date: 12/19/2020 Goal Status: Active Interventions: Provide education on fall prevention Provide education on personal and home safety Notes: Pain, Acute or Chronic Nursing Diagnoses: Pain, acute or chronic: actual or potential Goals: Patient will verbalize adequate pain control and receive pain control interventions during procedures as needed Date Initiated: 12/19/2020 Target Resolution Date: 12/19/2020 Goal Status: Active Interventions: Assess comfort goal upon admission Treatment Activities: Administer pain control measures as ordered : 12/19/2020 Notes: Wound/Skin Impairment Nursing Diagnoses: Impaired tissue integrity Goals: Patient/caregiver will verbalize understanding of skin care regimen Date Initiated: 12/19/2020 Target Resolution Date: 12/19/2020 Goal Status: Active Ulcer/skin breakdown will have a volume reduction of 30% by week 4 Date Initiated: 12/19/2020 Target Resolution Date: 01/18/2021 Goal Status: Active Interventions: Assess ulceration(s) every visit Treatment Activities: Skin care regimen initiated : 12/19/2020 LYNX, GOODRICH ([id number]) Topical wound management initiated : 12/19/2020 Notes: Electronic Signature(s) Signed: 01/09/2021 4:08:37 PM By: Elliot Gurney, BSN, RN, CWS, Kim RN, BSN Entered By: Elliot Gurney, BSN, RN, CWS, Kim on 01/09/2021 13:56:37 Derringer, [name] ([id number]) -------------------------------------------------------------------------------- Pain Assessment Details Patient Name: Calvin Spencer. Date of Service: 01/09/2021 1:30 PM Medical Record Number: [id number] Patient Account Number: [id number] Date  of Birth/Sex: 1976/05/13 (45 y.o. M) Treating RN: Hansel Feinstein Primary Care Wyolene Weimann: SYSTEM, PCP Other Clinician: Lolita Cram Referring Lydiah Pong: Duwayne Heck Treating Aws Shere/Extender: Altamese [place] in Treatment: 3 Active Problems Location of Pain Severity and Description of Pain Patient Has Paino Yes Site Locations Pain Location: Pain in Ulcers Rate the pain. Current Pain Level: 1 Pain Management and Medication Current Pain Management: Electronic Signature(s) Signed: 01/09/2021 2:06:04 PM By: Hansel Feinstein Entered By: Hansel Feinstein on 01/09/2021 13:36:25 Petro, [name] ([id number]) -------------------------------------------------------------------------------- Patient/Caregiver Education Details Patient Name: Calvin Spencer. Date of Service: 01/09/2021 1:30 PM Medical Record Number: [id number] Patient Account Number: [id number] Date of Birth/Gender: 1976/08/31 (45 y.o. M) Treating RN: Huel Coventry Primary Care Physician: SYSTEM, PCP Other Clinician: Lolita Cram Referring Physician: Duwayne Heck Treating Physician/Extender: Altamese  in Treatment: 3 Education Assessment Education Provided To: Patient Education Topics Provided Venous: Handouts: Controlling Swelling with Multilayered Compression Wraps Methods: Demonstration, Explain/Verbal Responses: State content correctly Wound Debridement: Handouts: Wound Debridement Methods: Demonstration, Explain/Verbal Responses: State content correctly Electronic Signature(s) Signed: 01/09/2021 4:08:37 PM By: Elliot Gurney, BSN, RN, CWS, Kim RN, BSN Entered By: Elliot Gurney, BSN, RN, CWS, Kim  on 01/09/2021 14:01:13 ALEKSANDR, PELLOW ([id number]) -------------------------------------------------------------------------------- Wound Assessment Details Patient Name: KILLIAN, SCHWER. Date of Service: 01/09/2021 1:30 PM Medical Record Number: [id number] Patient Account Number: [id number] Date of Birth/Sex:  11-13-1975 (45 y.o. M) Treating RN: Hansel Feinstein Primary Care Ladarryl Wrage: SYSTEM, PCP Other Clinician: Lolita Cram Referring Maxie Slovacek: Duwayne Heck Treating Clark Clowdus/Extender: Altamese [place] in Treatment: 3 Wound Status Wound Number: 1 Primary Etiology: Trauma, Other Wound Location: Left, Proximal Malleolus Wound Status: Open Wounding Event: Trauma Date Acquired: 11/19/2020 Weeks Of Treatment: 3 Clustered Wound: No Photos Wound Measurements Length: (cm) 0.7 Width: (cm) 0.3 Depth: (cm) 0.1 Area: (cm) 0.165 Volume: (cm) 0.016 % Reduction in Area: 70% % Reduction in Volume: 70.9% Tunneling: No Undermining: No Wound Description Classification: Full Thickness Without Exposed Support Structure Exudate Amount: None Present s Slough/Fibrino Yes Wound Bed Granulation Amount: None Present (0%) Exposed Structure Necrotic Amount: Large (67-100%) Fascia Exposed: No Necrotic Quality: Eschar Fat Layer (Subcutaneous Tissue) Exposed: Yes Tendon Exposed: No Muscle Exposed: No Joint Exposed: No Bone Exposed: No Treatment Notes Wound #1 (Malleolus) Wound Laterality: Left, Proximal Cleanser Peri-Wound Care Topical Primary Dressing Hydrofera Blue Ready Transfer Foam, 2.5x2.5 (in/in) MURL, GOLLADAY ([id number]) Discharge Instruction: Apply Hydrofera Blue Ready to wound bed as directed Secondary Dressing ABD Pad 5x9 (in/in) Discharge Instruction: Cover with ABD pad Secured With Compression Wrap Profore Lite LF 3 Multilayer Compression Bandaging System Discharge Instruction: Apply 3 multi-layer wrap as prescribed. Compression Stockings Add-Ons Electronic Signature(s) Signed: 01/09/2021 2:06:04 PM By: Hansel Feinstein Entered By: Hansel Feinstein on 01/09/2021 13:42:53 Coleman, [name] ([id number]) -------------------------------------------------------------------------------- Wound Assessment Details Patient Name: Calvin Spencer. Date of Service: 01/09/2021 1:30  PM Medical Record Number: [id number] Patient Account Number: [id number] Date of Birth/Sex: 07-08-1976 (45 y.o. M) Treating RN: Hansel Feinstein Primary Care Lilja Soland: SYSTEM, PCP Other Clinician: Lolita Cram Referring Cap Massi: Duwayne Heck Treating Hermon Zea/Extender: Altamese [place] in Treatment: 3 Wound Status Wound Number: 2 Primary Etiology: Trauma, Other Wound Location: Left, Distal Malleolus Wound Status: Open Wounding Event: Trauma Date Acquired: 11/19/2020 Weeks Of Treatment: 3 Clustered Wound: No Photos Wound Measurements Length: (cm) 0.5 Width: (cm) 0.3 Depth: (cm) 0.1 Area: (cm) 0.118 Volume: (cm) 0.012 % Reduction in Area: 97% % Reduction in Volume: 96.9% Tunneling: No Undermining: No Wound Description Classification: Full Thickness Without Exposed Support Structure s Foul Odor After Cleansing: No Slough/Fibrino Yes Wound Bed Granulation Amount: None Present (0%) Exposed Structure Necrotic Amount: Large (67-100%) Fascia Exposed: No Necrotic Quality: Eschar Fat Layer (Subcutaneous Tissue) Exposed: Yes Tendon Exposed: No Muscle Exposed: No Joint Exposed: No Bone Exposed: No Treatment Notes Wound #2 (Malleolus) Wound Laterality: Left, Distal Cleanser Peri-Wound Care Topical Primary Dressing Hydrofera Blue Ready Transfer Foam, 2.5x2.5 (in/in) ALMOND, FITZGIBBON ([id number]) Discharge Instruction: Apply Hydrofera Blue Ready to wound bed as directed Secondary Dressing ABD Pad 5x9 (in/in) Discharge Instruction: Cover with ABD pad Secured With Compression Wrap Profore Lite LF 3 Multilayer Compression Bandaging System Discharge Instruction: Apply 3 multi-layer wrap as prescribed. Compression Stockings Add-Ons Electronic Signature(s) Signed: 01/09/2021 2:06:04 PM By: Hansel Feinstein Entered By: Hansel Feinstein on 01/09/2021 13:43:53 Passage, [name] ([id number]) -------------------------------------------------------------------------------- Wound  Assessment Details Patient Name: Calvin Spencer. Date of Service: 01/09/2021 1:30 PM Medical Record Number: [id number] Patient Account Number: [id number] Date of Birth/Sex: 26-Feb-1976 (45 y.o. M) Treating RN: Huel Coventry Primary Care Galan Ghee: SYSTEM, PCP Other Clinician: Lolita Cram Referring Saoirse Legere: Duwayne Heck Treating Alleya Demeter/Extender: Altamese [place] in Treatment: 3 Wound Status Wound Number: 3 Primary Etiology:  Trauma, Other Wound Location: Left, Posterior Malleolus Wound Status: Healed - Epithelialized Wounding Event: Trauma Date Acquired: 11/19/2020 Weeks Of Treatment: 3 Clustered Wound: No Photos Wound Measurements Length: (cm) 0 Width: (cm) 0 Depth: (cm) 0 Area: (cm) 0 Volume: (cm) 0 % Reduction in Area: 100% % Reduction in Volume: 100% Epithelialization: Large (67-100%) Tunneling: No Undermining: No Wound Description Classification: Full Thickness Without Exposed Support Structure Exudate Amount: None Present s Foul Odor After Cleansing: No Slough/Fibrino No Wound Bed Necrotic Amount: Large (67-100%) Exposed Structure Necrotic Quality: Eschar Fascia Exposed: No Fat Layer (Subcutaneous Tissue) Exposed: Yes Tendon Exposed: No Muscle Exposed: No Joint Exposed: No Bone Exposed: No Treatment Notes Wound #3 (Malleolus) Wound Laterality: Left, Posterior Cleanser Peri-Wound Care Topical Primary Dressing DASEAN, BROW ([id number]) Secondary Dressing Secured With Compression Wrap Compression Stockings Add-Ons Electronic Signature(s) Signed: 01/09/2021 4:08:37 PM By: Elliot Gurney, BSN, RN, CWS, Kim RN, BSN Entered By: Elliot Gurney, BSN, RN, CWS, Kim on 01/09/2021 13:56:09 [place]ford, [name] ([id number]) -------------------------------------------------------------------------------- Vitals Details Patient Name: Calvin Spencer. Date of Service: 01/09/2021 1:30 PM Medical Record Number: [id number] Patient Account Number: [id number] Date of  Birth/Sex: 06-21-1976 (45 y.o. M) Treating RN: Hansel Feinstein Primary Care Ivyana Locey: SYSTEM, PCP Other Clinician: Lolita Cram Referring Edrick Whitehorn: Duwayne Heck Treating Amadu Schlageter/Extender: Altamese [place] in Treatment: 3 Vital Signs Time Taken: 13:35 Temperature (F): 98 Height (in): 73 Pulse (bpm): 85 Weight (lbs): 260 Respiratory Rate (breaths/min): 16 Body Mass Index (BMI): 34.3 Blood Pressure (mmHg): 146/88 Reference Range: 80 - 120 mg / dl Electronic Signature(s) Signed: 01/09/2021 2:06:04 PM By: Hansel Feinstein Entered ByHansel Feinstein on 01/09/2021 13:36:16

## 2021-01-09 NOTE — Progress Notes (Signed)
BLAYDEN, CONWELL ([id number]) Visit Report for 01/09/2021 Debridement Details Patient Name: [name], [name] Date of Service: 01/09/2021 1:30 PM Medical Record Number: [id number] Patient Account Number: [id number] Date of Birth/Sex: 1976-03-03 (45 y.o. M) Treating RN: Huel Coventry Primary Care Provider: SYSTEM, PCP Other Clinician: Lolita Cram Referring Provider: Duwayne Heck Treating Provider/Extender: Altamese Whitney in Treatment: 3 Debridement Performed for Wound #1 Left,Proximal Malleolus Assessment: Performed By: Physician Maxwell Caul, MD Debridement Type: Debridement Level of Consciousness (Pre- Awake and Alert procedure): Pre-procedure Verification/Time Out Yes - 13:57 Taken: Total Area Debrided (L x W): 0.7 (cm) x 0.3 (cm) = 0.21 (cm) Tissue and other material Non-Viable, Eschar debrided: Level: Non-Viable Tissue Debridement Description: Selective/Open Wound Instrument: Curette Bleeding: Minimum Hemostasis Achieved: Pressure Response to Treatment: Procedure was tolerated well Level of Consciousness (Post- Awake and Alert procedure): Post Debridement Measurements of Total Wound Length: (cm) 0.7 Width: (cm) 0.3 Depth: (cm) 0.1 Volume: (cm) 0.016 Character of Wound/Ulcer Post Debridement: Stable Post Procedure Diagnosis Same as Pre-procedure Electronic Signature(s) Signed: 01/09/2021 4:08:37 PM By: Elliot Gurney, BSN, RN, CWS, Kim RN, BSN Signed: 01/09/2021 5:04:28 PM By: Baltazar Najjar MD Entered By: Elliot Gurney, BSN, RN, CWS, Kim on 01/09/2021 13:59:16 Ziff, Cecille Aver ([id number]) -------------------------------------------------------------------------------- Debridement Details Patient Name: [name] Date of Service: 01/09/2021 1:30 PM Medical Record Number: [id number] Patient Account Number: [id number] Date of Birth/Sex: 21-Dec-1975 (45 y.o. M) Treating RN: Huel Coventry Primary Care Provider: SYSTEM, PCP Other Clinician: Lolita Cram Referring Provider: Duwayne Heck Treating Provider/Extender: Altamese Broadus in Treatment: 3 Debridement Performed for Wound #2 Left,Distal Malleolus Assessment: Performed By: Physician Maxwell Caul, MD Debridement Type: Debridement Level of Consciousness (Pre- Awake and Alert procedure): Pre-procedure Verification/Time Out Yes - 13:57 Taken: Total Area Debrided (L x W): 0.5 (cm) x 0.1 (cm) = 0.05 (cm) Tissue and other material Non-Viable, Eschar debrided: Level: Non-Viable Tissue Debridement Description: Selective/Open Wound Instrument: Curette Bleeding: Minimum Hemostasis Achieved: Pressure Response to Treatment: Procedure was tolerated well Level of Consciousness (Post- Awake and Alert procedure): Post Debridement Measurements of Total Wound Length: (cm) 0.5 Width: (cm) 0.3 Depth: (cm) 0.1 Volume: (cm) 0.012 Character of Wound/Ulcer Post Debridement: Stable Post Procedure Diagnosis Same as Pre-procedure Electronic Signature(s) Signed: 01/09/2021 4:08:37 PM By: Elliot Gurney, BSN, RN, CWS, Kim RN, BSN Signed: 01/09/2021 5:04:28 PM By: Baltazar Najjar MD Entered By: Elliot Gurney, BSN, RN, CWS, Kim on 01/09/2021 14:00:04 Gorden, Cecille Aver ([id number]) -------------------------------------------------------------------------------- HPI Details Patient Name: [name] Date of Service: 01/09/2021 1:30 PM Medical Record Number: [id number] Patient Account Number: [id number] Date of Birth/Sex: 26-Nov-1975 (45 y.o. M) Treating RN: Huel Coventry Primary Care Provider: SYSTEM, PCP Other Clinician: Lolita Cram Referring Provider: Duwayne Heck Treating Provider/Extender: Altamese Hagan in Treatment: 3 History of Present Illness HPI Description: ADMISSION 12/19/2020 This is a 45 year old otherwise healthy man who had a fall at work suffering a left ankle fracture on 10/02/2020. He required operative correction with pinning. I am not exactly sure how  he did in the interim how he had however he had another fall apparently in late October falling down some stairs and that is when he describes 3 separate areas opening. He is followed with Dr. Aundria Rud at [facility]. I really cannot get a sense exactly how these progressed however he is simply been putting dry gauze on this and an Ace wrap. I guess after the second fall he had a lot of swelling and Dr. Aundria Rud has put him on 2 empiric trials of Keflex. Things  seem to be settling down more. He is not a diabetic does not have known arterial disease. He has had previous knee surgery. ABI in our clinic was 1.19 on the left 4/13; 3 small areas. The area on the incision line posteriorly looks as though it is about to fully epithelialized. The other 2 areas both had friable hyper granulation 1 is above the lateral malleolus and one below more in the heel area. I used silver nitrate on both of these areas. 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 He works at an [employer] in [place] where this injury occurred while walking on ice 4/27; the patient saw orthopedics today they took him out of his cam boot and gave him an ankle support. The larger area which was on the incision line on the left lateral ankle is fully epithelialized 2 small areas anteriorly had eschar on the surface. We have been using Hydrofera Blue Electronic Signature(s) Signed: 01/09/2021 5:04:28 PM By: Baltazar Najjar MD Entered By: Baltazar Najjar on 01/09/2021 13:59:20 Laverdure, Cecille Aver ([id number]) -------------------------------------------------------------------------------- Physical Exam Details Patient Name: [name] Date of Service: 01/09/2021 1:30 PM Medical Record Number: [id number] Patient Account Number: [id number] Date of Birth/Sex: 05-05-1976 (45 y.o. M) Treating RN: Huel Coventry Primary Care Provider: SYSTEM, PCP Other Clinician: Lolita Cram Referring Provider: Duwayne Heck Treating Provider/Extender: Altamese [place] in Treatment: 3 Constitutional Patient is hypertensive.. Pulse regular and within target range for patient.Marland Kitchen Respirations regular, non-labored and within target range.. Temperature is normal and within the target range for the patient.Marland Kitchen appears in no distress. Notes Wound exam; the larger area posteriorly on the incision line is fully epithelialized. 2 small areas anteriorly both with eschar I removed. The tissue underneath the eschar is almost fully healed in both locations and should be closed by next week. Electronic Signature(s) Signed: 01/09/2021 5:04:28 PM By: Baltazar Najjar MD Entered By: Baltazar Najjar on 01/09/2021 14:00:06 Kantz, Cecille Aver ([id number]) -------------------------------------------------------------------------------- Physician Orders Details Patient Name: [name] Date of Service: 01/09/2021 1:30 PM Medical Record Number: [id number] Patient Account Number: [id number] Date of Birth/Sex: [birth year] (45 y.o. M) Treating RN: Huel Coventry Primary Care Provider: SYSTEM, PCP Other Clinician: Lolita Cram Referring Provider: Duwayne Heck Treating Provider/Extender: Altamese Saltillo in Treatment: 3 Verbal / Phone Orders: No Diagnosis Coding ICD-10 Coding Code Description L97.328 Non-pressure chronic ulcer of left ankle with other specified severity T81.31XD Disruption of external operation (surgical) wound, not elsewhere classified, subsequent encounter Follow-up Appointments o Return Appointment in 1 week. Bathing/ Shower/ Hygiene o May shower with wound dressing protected with water repellent cover or cast protector. Edema Control - Lymphedema / Segmental Compressive Device / Other o Optional: One layer of unna paste to top of compression wrap (to act as an anchor). o  Elevate, Exercise Daily and Avoid Standing for Long Periods of Time. o Elevate legs to the level of the heart and pump ankles as often as possible o Elevate leg(s) parallel to the floor when sitting. Wound Treatment Wound #1 - Malleolus Wound Laterality: Left, Proximal Primary Dressing: Hydrofera Blue Ready Transfer Foam, 2.5x2.5 (in/in) 2 x Per Week/30 Days Discharge Instructions: Apply Hydrofera Blue Ready to wound bed as directed Secondary Dressing: ABD Pad 5x9 (in/in) 2 x Per Week/30 Days Discharge Instructions: Cover with ABD pad Compression Wrap: Profore Lite LF 3 Multilayer Compression Bandaging System 2 x  Per Week/30 Days Discharge Instructions: Apply 3 multi-layer wrap as prescribed. Wound #2 - Malleolus Wound Laterality: Left, Distal Primary Dressing: Hydrofera Blue Ready Transfer Foam, 2.5x2.5 (in/in) Discharge Instructions: Apply Hydrofera Blue Ready to wound bed as directed Secondary Dressing: ABD Pad 5x9 (in/in) Discharge Instructions: Cover with ABD pad Compression Wrap: Profore Lite LF 3 Multilayer Compression Bandaging System Discharge Instructions: Apply 3 multi-layer wrap as prescribed. Electronic Signature(s) Signed: 01/09/2021 4:08:37 PM By: Elliot Gurney, BSN, RN, CWS, Kim RN, BSN Signed: 01/09/2021 5:04:28 PM By: Baltazar Najjar MD Entered By: Elliot Gurney, BSN, RN, CWS, Kim on 01/09/2021 14:00:37 Honeyman, Cecille Aver ([id number]) -------------------------------------------------------------------------------- Problem List Details Patient Name: JIONNI, HELMING. Date of Service: 01/09/2021 1:30 PM Medical Record Number: [id number] Patient Account Number: [id number] Date of Birth/Sex: [birth year] (45 y.o. M) Treating RN: Huel Coventry Primary Care Provider: SYSTEM, PCP Other Clinician: Lolita Cram Referring Provider: Duwayne Heck Treating Provider/Extender: Altamese [place] in Treatment: 3 Active Problems ICD-10 Encounter Code Description Active Date  MDM Diagnosis L97.328 Non-pressure chronic ulcer of left ankle with other specified severity 12/19/2020 No Yes T81.31XD Disruption of external operation (surgical) wound, not elsewhere 12/19/2020 No Yes classified, subsequent encounter Inactive Problems Resolved Problems Electronic Signature(s) Signed: 01/09/2021 5:04:28 PM By: Baltazar Najjar MD Entered By: Baltazar Najjar on 01/09/2021 13:56:52 Rita, Cecille Aver ([id number]) -------------------------------------------------------------------------------- Progress Note Details Patient Name: [name] Date of Service: 01/09/2021 1:30 PM Medical Record Number: [id number] Patient Account Number: [id number] Date of Birth/Sex: Jun 04, 1976 (45 y.o. M) Treating RN: Huel Coventry Primary Care Provider: SYSTEM, PCP Other Clinician: Lolita Cram Referring Provider: Duwayne Heck Treating Provider/Extender: Altamese Alice in Treatment: 3 Subjective History of Present Illness (HPI) ADMISSION 12/19/2020 This is a 45 year old otherwise healthy man who had a fall at work suffering a left ankle fracture on 10/02/2020. He required operative correction with pinning. I am not exactly sure how he did in the interim how he had however he had another fall apparently in late October falling down some stairs and that is when he describes 3 separate areas opening. He is followed with Dr. Aundria Rud at [facility]. I really cannot get a sense exactly how these progressed however he is simply been putting dry gauze on this and an Ace wrap. I guess after the second fall he had a lot of swelling and Dr. Aundria Rud has put him on 2 empiric trials of Keflex. Things seem to be settling down more. He is not a diabetic does not have known arterial disease. He has had previous knee surgery. ABI in our clinic was 1.19 on the left 4/13; 3 small areas. The area on the incision line posteriorly looks as though it is about to fully epithelialized. The other 2 areas both  had friable hyper granulation 1 is above the lateral malleolus and one below more in the heel area. I used silver nitrate on both of these areas. 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 He works at an [employer] in [place] where this injury occurred while walking on ice 4/27; the patient saw orthopedics today they took him out of his cam boot and gave him an ankle support. The larger area which was on the incision line on the left lateral ankle is fully epithelialized 2 small areas anteriorly had eschar on the surface. We have been using Hydrofera Blue Objective Constitutional Patient is hypertensive.. Pulse regular and  within target range for patient.Marland Kitchen Respirations regular, non-labored and within target range.. Temperature is normal and within the target range for the patient.Marland Kitchen appears in no distress. Vitals Time Taken: 1:35 PM, Height: 73 in, Weight: 260 lbs, BMI: 34.3, Temperature: 98 F, Pulse: 85 bpm, Respiratory Rate: 16 breaths/min, Blood Pressure: 146/88 mmHg. General Notes: Wound exam; the larger area posteriorly on the incision line is fully epithelialized. 2 small areas anteriorly both with eschar I removed. The tissue underneath the eschar is almost fully healed in both locations and should be closed by next week. Integumentary (Hair, Skin) Wound #1 status is Open. Original cause of wound was Trauma. The date acquired was: 11/19/2020. The wound has been in treatment 3 weeks. The wound is located on the Left,Proximal Malleolus. The wound measures 0.7cm length x 0.3cm width x 0.1cm depth; 0.165cm^2 area and 0.016cm^3 volume. There is Fat Layer (Subcutaneous Tissue) exposed. There is no tunneling or undermining noted. There is a none present amount of drainage noted. There is no granulation within the wound bed. There is a large (67-100%) amount of  necrotic tissue within the wound bed including Eschar. Wound #2 status is Open. Original cause of wound was Trauma. The date acquired was: 11/19/2020. The wound has been in treatment 3 weeks. The wound is located on the Left,Distal Malleolus. The wound measures 0.5cm length x 0.3cm width x 0.1cm depth; 0.118cm^2 area and 0.012cm^3 volume. There is Fat Layer (Subcutaneous Tissue) exposed. There is no tunneling or undermining noted. There is no granulation within the wound bed. There is a large (67-100%) amount of necrotic tissue within the wound bed including Eschar. Wound #3 status is Healed - Epithelialized. Original cause of wound was Trauma. The date acquired was: 11/19/2020. The wound has been in treatment 3 weeks. The wound is located on the Left,Posterior Malleolus. The wound measures 0cm length x 0cm width x 0cm depth; 0cm^2 area and 0cm^3 volume. There is Fat Layer (Subcutaneous Tissue) exposed. There is no tunneling or undermining noted. There is a none present amount of drainage noted. There is a large (67-100%) amount of necrotic tissue within the wound bed including Eschar. DEMARYIUS, IMRAN ([id number]) Assessment Active Problems ICD-10 Non-pressure chronic ulcer of left ankle with other specified severity Disruption of external operation (surgical) wound, not elsewhere classified, subsequent encounter Procedures Wound #1 Pre-procedure diagnosis of Wound #1 is a Trauma, Other located on the Left,Proximal Malleolus . There was a Selective/Open Wound Non-Viable Tissue Debridement with a total area of 0.21 sq cm performed by Maxwell Caul, MD. With the following instrument(s): Curette to remove Non-Viable tissue/material. Material removed includes Eschar. A time out was conducted at 13:57, prior to the start of the procedure. A Minimum amount of bleeding was controlled with Pressure. The procedure was tolerated well. Post Debridement Measurements: 0.7cm length x 0.3cm width x 0.1cm  depth; 0.016cm^3 volume. Character of Wound/Ulcer Post Debridement is stable. Post procedure Diagnosis Wound #1: Same as Pre-Procedure Wound #2 Pre-procedure diagnosis of Wound #2 is a Trauma, Other located on the Left,Distal Malleolus . There was a Selective/Open Wound Non-Viable Tissue Debridement with a total area of 0.05 sq cm performed by Maxwell Caul, MD. With the following instrument(s): Curette to remove Non-Viable tissue/material. Material removed includes Eschar. A time out was conducted at 13:57, prior to the start of the procedure. A Minimum amount of bleeding was controlled with Pressure. The procedure was tolerated well. Post Debridement Measurements: 0.5cm length x 0.3cm width x 0.1cm depth; 0.012cm^3 volume.  Character of Wound/Ulcer Post Debridement is stable. Post procedure Diagnosis Wound #2: Same as Pre-Procedure Plan 1. Continue Hydrofera Blue under 3 layer compression 2. Should be closed by next week. The 2 small anterior wounds that had eschar today are just about closed when this was removed Electronic Signature(s) Signed: 01/09/2021 5:04:28 PM By: Baltazar Najjarobson, Tylasia Fletchall MD Entered By: Baltazar Najjarobson, Nader Boys on 01/09/2021 14:00:26 Scoville, Cecille AverJASON R. ([id number]) -------------------------------------------------------------------------------- SuperBill Details Patient Name: Calvin SpencerMONTGOMERY, Garett R. Date of Service: 01/09/2021 Medical Record Number: [id number] Patient Account Number: [id number] Date of Birth/Sex: 07/21/1976 (45 y.o. M) Treating RN: Huel CoventryWoody, Kim Primary Care Provider: SYSTEM, PCP Other Clinician: Lolita CramBurnette, Kyara Referring Provider: Duwayne Heckogers, Terral Treating Provider/Extender: Altamese CarolinaOBSON, Tawny Raspberry G Weeks in Treatment: 3 Diagnosis Coding ICD-10 Codes Code Description [phone] [id number] Non-pressure chronic ulcer of left ankle with other specified severity T81.31XD Disruption of external operation (surgical) wound, not elsewhere classified, subsequent encounter Facility  Procedures CPT4 Code Description: [id number] 97597 - DEBRIDE WOUND 1ST 20 SQ CM OR < Modifier: Quantity: 1 CPT4 Code Description: ICD-10 Diagnosis Description L97.328 Non-pressure chronic ulcer of left ankle with other specified severity T81.31XD Disruption of external operation (surgical) wound, not elsewhere classifie Modifier: d, subsequent enco Quantity: unter Physician Procedures CPT4 Code Description: [id number] 97597 - WC PHYS DEBR WO ANESTH 20 SQ CM Modifier: Quantity: 1 CPT4 Code Description: ICD-10 Diagnosis Description L97.328 Non-pressure chronic ulcer of left ankle with other specified severity T81.31XD Disruption of external operation (surgical) wound, not elsewhere classifie Modifier: d, subsequent enco Quantity: Printmakerunter Electronic Signature(s) Signed: 01/09/2021 5:04:28 PM By: Baltazar Najjarobson, Ruthellen Tippy MD Entered By: Baltazar Najjarobson, Tanay Massiah on 01/09/2021 14:00:39

## 2021-01-11 ENCOUNTER — Encounter (HOSPITAL_BASED_OUTPATIENT_CLINIC_OR_DEPARTMENT_OTHER): Payer: Self-pay | Admitting: Internal Medicine

## 2021-01-16 ENCOUNTER — Encounter: Payer: Worker's Compensation | Attending: Internal Medicine | Admitting: Internal Medicine

## 2021-01-16 ENCOUNTER — Other Ambulatory Visit: Payer: Self-pay

## 2021-01-16 DIAGNOSIS — Y990 Civilian activity done for income or pay: Secondary | ICD-10-CM | POA: Diagnosis not present

## 2021-01-16 DIAGNOSIS — L97328 Non-pressure chronic ulcer of left ankle with other specified severity: Secondary | ICD-10-CM | POA: Diagnosis present

## 2021-01-16 DIAGNOSIS — T8131XD Disruption of external operation (surgical) wound, not elsewhere classified, subsequent encounter: Secondary | ICD-10-CM | POA: Insufficient documentation

## 2021-01-16 DIAGNOSIS — Y9329 Activity, other involving ice and snow: Secondary | ICD-10-CM | POA: Diagnosis not present

## 2021-01-16 DIAGNOSIS — W2209XD Striking against other stationary object, subsequent encounter: Secondary | ICD-10-CM | POA: Diagnosis not present

## 2021-01-17 NOTE — Progress Notes (Signed)
ANKUSH, GINTZ ([id number]) Visit Report for 01/16/2021 Arrival Information Details Patient Name: [name], [name] Date of Service: 01/16/2021 2:45 PM Medical Record Number: [id number] Patient Account Number: [id number] Date of Birth/Sex: [birth year] (45 y.o. Male) Treating RN: Hansel Feinstein Primary Care Markeisha Mancias: SYSTEM, PCP Other Clinician: Referring Reino Lybbert: Duwayne Heck Treating Karlin Binion/Extender: Altamese [place] in Treatment: 4 Visit Information History Since Last Visit Added or deleted any medications: No Patient Arrived: Ambulatory Had a fall or experienced change in No Arrival Time: 14:55 activities of daily living that may affect Accompanied By: self risk of falls: Transfer Assistance: None Hospitalized since last visit: No Patient Identification Verified: Yes Has Dressing in Place as Prescribed: Yes Secondary Verification Process Completed: Yes Pain Present Now: No Patient Has Alerts: Yes Patient Alerts: NOT DIABETIC Electronic Signature(s) Signed: 01/16/2021 4:20:07 PM By: Hansel Feinstein Entered By: Hansel Feinstein on 01/16/2021 14:56:02 Tokarski, Calvin Aver ([id number]) -------------------------------------------------------------------------------- Clinic Level of Care Assessment Details Patient Name: [name] Date of Service: 01/16/2021 2:45 PM Medical Record Number: [id number] Patient Account Number: [id number] Date of Birth/Sex: 11-17-1975 (45 y.o. Male) Treating RN: Huel Coventry Primary Care Mendy Lapinsky: SYSTEM, PCP Other Clinician: Referring Rossy Virag: Duwayne Heck Treating Orrin Yurkovich/Extender: Altamese [place] in Treatment: 4 Clinic Level of Care Assessment Items TOOL 4 Quantity Score []  - Use when only an EandM is performed on FOLLOW-UP visit 0 ASSESSMENTS - Nursing Assessment / Reassessment X - Reassessment of Co-morbidities (includes updates in patient status) 1 10 X- 1 5 Reassessment of Adherence to Treatment Plan ASSESSMENTS - Wound and  Skin Assessment / Reassessment X - Simple Wound Assessment / Reassessment - one wound 1 5 []  - 0 Complex Wound Assessment / Reassessment - multiple wounds []  - 0 Dermatologic / Skin Assessment (not related to wound area) ASSESSMENTS - Focused Assessment []  - Circumferential Edema Measurements - multi extremities 0 []  - 0 Nutritional Assessment / Counseling / Intervention []  - 0 Lower Extremity Assessment (monofilament, tuning fork, pulses) []  - 0 Peripheral Arterial Disease Assessment (using hand held doppler) ASSESSMENTS - Ostomy and/or Continence Assessment and Care []  - Incontinence Assessment and Management 0 []  - 0 Ostomy Care Assessment and Management (repouching, etc.) PROCESS - Coordination of Care X - Simple Patient / Family Education for ongoing care 1 15 []  - 0 Complex (extensive) Patient / Family Education for ongoing care X- 1 10 Staff obtains , Records, Test Results / Process Orders []  - 0 Staff telephones HHA, Nursing Homes / Clarify orders / etc []  - 0 Routine Transfer to another Facility (non-emergent condition) []  - 0 Routine Hospital Admission (non-emergent condition) []  - 0 New Admissions / / Ordering NPWT, Apligraf, etc. []  - 0 Emergency Hospital Admission (emergent condition) X- 1 10 Simple Discharge Coordination []  - 0 Complex (extensive) Discharge Coordination PROCESS - Special Needs []  - Pediatric / Minor Patient Management 0 []  - 0 Isolation Patient Management []  - 0 Hearing / Language / Visual special needs []  - 0 Assessment of Community assistance (transportation, D/C planning, etc.) []  - 0 Additional assistance / Altered mentation []  - 0 Support Surface(s) Assessment (bed, cushion, seat, etc.) INTERVENTIONS - Wound Cleansing / Measurement Baris, Bodey R. ( ) X- 1 5 Simple Wound Cleansing - one wound []  - 0 Complex Wound Cleansing - multiple wounds X- 1 5 Wound Imaging (photographs - any  number of wounds) []  - 0 Wound Tracing (instead of photographs) X- 1 5 Simple Wound Measurement - one wound []  - 0 Complex Wound  Measurement - multiple wounds INTERVENTIONS - Wound Dressings []  - Small Wound Dressing one or multiple wounds 0 []  - 0 Medium Wound Dressing one or multiple wounds []  - 0 Large Wound Dressing one or multiple wounds []  - 0 Application of Medications - topical []  - 0 Application of Medications - injection INTERVENTIONS - Miscellaneous []  - External ear exam 0 []  - 0 Specimen Collection (cultures, biopsies, blood, body fluids, etc.) []  - 0 Specimen(s) / Culture(s) sent or taken to Lab for analysis []  - 0 Patient Transfer (multiple staff / / Similar devices) []  - 0 Simple Staple / Suture removal (25 or less) []  - 0 Complex Staple / Suture removal (26 or more) []  - 0 Hypo / Hyperglycemic Management (close monitor of Blood Glucose) []  - 0 Ankle / Brachial Index (ABI) - do not check if billed separately X- 1 5 Vital Signs Has the patient been seen at the hospital within the last three years: Yes Total Score: 75 Level Of Care: New/Established - Level 2 Electronic Signature(s) Signed: 01/16/2021 6:13:14 PM By: , BSN, RN, CWS, Kim RN, BSN Entered By: , BSN, RN, CWS, Kim on 01/16/2021 15:37:11 Teas, ( ) -------------------------------------------------------------------------------- Encounter Discharge Information Details Patient Name: . Date of Service: 01/16/2021 2:45 PM Medical Record Number: Nurse, adult Patient Account Number: Date of Birth/Sex: [birth year] (45 y.o. Male) Treating RN: Primary Care Harlowe Dowler: SYSTEM, PCP Other Clinician: Referring Conor Lata: 03/18/2021 Treating Mariella Blackwelder/Extender: Elliot Gurney in Treatment: 4 Encounter Discharge Information Items Discharge Condition: Stable Ambulatory Status: Ambulatory Discharge Destination:  Home Transportation: Private Auto Accompanied By: self Schedule Follow-up Appointment: Yes Clinical Summary of Care: Electronic Signature(s) Signed: 01/16/2021 6:13:14 PM By: 03/18/2021, BSN, RN, CWS, Kim RN, BSN Entered By: Calvin Aver, BSN, RN, CWS, Kim on 01/16/2021 15:38:02 Sidle, [name] (03/18/2021) -------------------------------------------------------------------------------- Lower Extremity Assessment Details Patient Name: [name], [name] Date of Service: 01/16/2021 2:45 PM Medical Record Number: 06/21/1976 Patient Account Number: (50 Date of Birth/Sex: [birth year] (45 y.o. Male) Treating RN: Altamese Everetts Primary Care Coulson Wehner: SYSTEM, PCP Other Clinician: Referring Marqueze Ramcharan: 03/18/2021 Treating Chrystian Cupples/Extender: Elliot Gurney Weeks in Treatment: 4 Edema Assessment Assessed: [Left: Yes] [Right: No] [Left: Edema] [Right: :] Calf Left: Right: Point of Measurement: 38 cm From Medial Instep 41 cm Ankle Left: Right: Point of Measurement: 10 cm From Medial Instep 26 cm Vascular Assessment Pulses: Dorsalis Pedis Palpable: [Left:Yes] Electronic Signature(s) Signed: 01/16/2021 4:20:07 PM By: 03/18/2021 Entered By: Calvin Aver on 01/16/2021 15:08:30 Knab, [name] (03/18/2021) -------------------------------------------------------------------------------- Multi Wound Chart Details Patient Name: [id number]. Date of Service: 01/16/2021 2:45 PM Medical Record Number: 06/21/1976 Patient Account Number: (50 Date of Birth/Sex: 07/02/1976 (45 y.o. Male) Treating RN: Maxwell Caul Primary Care Jora Galluzzo: SYSTEM, PCP Other Clinician: Referring Cree Kunert: 03/18/2021 Treating Yaniv Lage/Extender: Hansel Feinstein Weeks in Treatment: 4 Vital Signs Height(in): 73 Pulse(bpm): 98 Weight(lbs): 260 Blood Pressure(mmHg): 146/90 Body Mass Index(BMI): 34 Temperature(F): 98.2 Respiratory Rate(breaths/min): 18 Photos: [N/A:N/A] Wound Location: Left, Proximal  Malleolus Left, Distal Malleolus N/A Wounding Event: Trauma Trauma N/A Primary Etiology: Trauma, Other Trauma, Other N/A Date Acquired: 11/19/2020 11/19/2020 N/A Weeks of Treatment: 4 4 N/A Wound Status: Healed - Epithelialized Healed - Epithelialized N/A Measurements L x W x D (cm) 0x0x0 0x0x0 N/A Area (cm) : 0 0 N/A Volume (cm) : 0 0 N/A % Reduction in Area: 100.00% 100.00% N/A % Reduction in Volume: 100.00% 100.00% N/A Classification: Full Thickness Without Exposed Full Thickness Without Exposed  N/A Support Structures Support Structures Exudate Amount: None Present N/A N/A Granulation Amount: None Present (0%) None Present (0%) N/A Necrotic Amount: Large (67-100%) Large (67-100%) N/A Necrotic Tissue: Eschar Eschar N/A Exposed Structures: Fat Layer (Subcutaneous Tissue): Fat Layer (Subcutaneous Tissue): N/A Yes Yes Fascia: No Fascia: No Tendon: No Tendon: No Muscle: No Muscle: No Joint: No Joint: No Bone: No Bone: No Treatment Notes Electronic Signature(s) Signed: 01/16/2021 6:13:14 PM By: Elliot GurneyWoody, BSN, RN, CWS, Kim RN, BSN Entered By: Elliot GurneyWoody, BSN, RN, CWS, Kim on 01/16/2021 15:35:13 Adinolfi, [name] ([id number]) -------------------------------------------------------------------------------- Multi-Disciplinary Care Plan Details Patient Name: [name], Calvin R. Date of Service: 01/16/2021 2:45 PM Medical Record Number: [id number] Patient Account Number: [id number] Date of Birth/Sex: 09/30/1975 [age] y.o. Male) Treating RN: Huel CoventryWoody, Kim Primary Care Bryli Mantey: SYSTEM, PCP Other Clinician: Referring Shanitra Phillippi: Duwayne Heckogers, Khoa Treating Kristjan Derner/Extender: Altamese CarolinaOBSON, MICHAEL G Weeks in Treatment: 4 Active Inactive Electronic Signature(s) Signed: 01/16/2021 6:13:14 PM By: Elliot GurneyWoody, BSN, RN, CWS, Kim RN, BSN Entered By: Elliot GurneyWoody, BSN, RN, CWS, Kim on 01/16/2021 15:34:55 Doris, [name] ([id number]) -------------------------------------------------------------------------------- Pain  Assessment Details Patient Name: [name], Ezri R. Date of Service: 01/16/2021 2:45 PM Medical Record Number: [id number] Patient Account Number: [id number] Date of Birth/Sex: 02/06/1976 [age] y.o. Male) Treating RN: Hansel FeinsteinBishop, Joy Primary Care Dyneisha Murchison: SYSTEM, PCP Other Clinician: Referring Breindel Collier: Duwayne Heckogers, Shiv Treating Maury Groninger/Extender: Maxwell CaulOBSON, MICHAEL G Weeks in Treatment: 4 Active Problems Location of Pain Severity and Description of Pain Patient Has Paino No Site Locations Rate the pain. Current Pain Level: 0 Pain Management and Medication Current Pain Management: Electronic Signature(s) Signed: 01/16/2021 4:20:07 PM By: Hansel FeinsteinBishop, Joy Entered By: Hansel FeinsteinBishop, Joy on 01/16/2021 14:58:44 Travaglini, [name] ([id number]) -------------------------------------------------------------------------------- Patient/Caregiver Education Details Patient Name: [name], Nabil R. Date of Service: 01/16/2021 2:45 PM Medical Record Number: [id number] Patient Account Number: [id number] Date of Birth/Gender: 01/29/1976 [age] y.o. Male) Treating RN: Huel CoventryWoody, Kim Primary Care Physician: SYSTEM, PCP Other Clinician: Referring Physician: Duwayne Heckogers, Merit Treating Physician/Extender: Altamese CarolinaOBSON, MICHAEL G Weeks in Treatment: 4 Education Assessment Education Provided To: Patient Education Topics Provided Wound/Skin Impairment: Handouts: Caring for Your Ulcer Methods: Demonstration, Explain/Verbal Responses: State content correctly Electronic Signature(s) Signed: 01/16/2021 6:13:14 PM By: Elliot GurneyWoody, BSN, RN, CWS, Kim RN, BSN Entered By: Elliot GurneyWoody, BSN, RN, CWS, Kim on 01/16/2021 15:37:29 Mcelveen, [name] ([id number]) -------------------------------------------------------------------------------- Wound Assessment Details Patient Name: [name], Rudolfo R. Date of Service: 01/16/2021 2:45 PM Medical Record Number: [id number] Patient Account Number: [id number] Date of Birth/Sex: 05/23/1976 [age] y.o. Male) Treating RN: Huel CoventryWoody,  Kim Primary Care Pualani Borah: SYSTEM, PCP Other Clinician: Referring Coree Brame: Duwayne Heckogers, Dennise Treating Dolorez Jeffrey/Extender: Maxwell CaulOBSON, MICHAEL G Weeks in Treatment: 4 Wound Status Wound Number: 1 Primary Etiology: Trauma, Other Wound Location: Left, Proximal Malleolus Wound Status: Healed - Epithelialized Wounding Event: Trauma Date Acquired: 11/19/2020 Weeks Of Treatment: 4 Clustered Wound: No Photos Wound Measurements Length: (cm) 0 Width: (cm) 0 Depth: (cm) 0 Area: (cm) 0 Volume: (cm) 0 % Reduction in Area: 100% % Reduction in Volume: 100% Tunneling: No Undermining: No Wound Description Classification: Full Thickness Without Exposed Support Structure Exudate Amount: None Present s Slough/Fibrino Yes Wound Bed Granulation Amount: None Present (0%) Exposed Structure Necrotic Amount: Large (67-100%) Fascia Exposed: No Necrotic Quality: Eschar Fat Layer (Subcutaneous Tissue) Exposed: Yes Tendon Exposed: No Muscle Exposed: No Joint Exposed: No Bone Exposed: No Treatment Notes Wound #1 (Malleolus) Wound Laterality: Left, Proximal Cleanser Peri-Wound Care Topical Primary Dressing Secondary Dressing [name], Wade R. ([id number]) Secured With Compression Wrap Compression Stockings Add-Ons Electronic Signature(s) Signed: 01/16/2021 6:13:14 PM By: Elliot GurneyWoody, BSN, RN,  CWS, Kim RN, BSN Entered By: Elliot Gurney, BSN, RN, CWS, Kim on 01/16/2021 15:33:35 T[place]on, Calvin Aver ([id number]) -------------------------------------------------------------------------------- Wound Assessment Details Patient Name: [name] Date of Service: 01/16/2021 2:45 PM Medical Record Number: [id number] Patient Account Number: [id number] Date of Birth/Sex: April 10, 1976 (45 y.o. Male) Treating RN: Huel Coventry Primary Care Domenic Schoenberger: SYSTEM, PCP Other Clinician: Referring Marcie Shearon: Duwayne Heck Treating Valton Schwartz/Extender: Maxwell Caul Weeks in Treatment: 4 Wound Status Wound Number: 2 Primary  Etiology: Trauma, Other Wound Location: Left, Distal Malleolus Wound Status: Healed - Epithelialized Wounding Event: Trauma Date Acquired: 11/19/2020 Weeks Of Treatment: 4 Clustered Wound: No Photos Wound Measurements Length: (cm) Width: (cm) Depth: (cm) Area: (cm) Volume: (cm) 0 % Reduction in Area: 100% 0 % Reduction in Volume: 100% 0 Tunneling: No 0 Undermining: No 0 Wound Description Classification: Full Thickness Without Exposed Support Structure s Foul Odor After Cleansing: No Slough/Fibrino Yes Wound Bed Granulation Amount: None Present (0%) Exposed Structure Necrotic Amount: Large (67-100%) Fascia Exposed: No Necrotic Quality: Eschar Fat Layer (Subcutaneous Tissue) Exposed: Yes Tendon Exposed: No Muscle Exposed: No Joint Exposed: No Bone Exposed: No Treatment Notes Wound #2 (Malleolus) Wound Laterality: Left, Distal Cleanser Peri-Wound Care Topical Primary Dressing BERNADETTE, ARMIJO ([id number]) Secondary Dressing Secured With Compression Wrap Compression Stockings Add-Ons Electronic Signature(s) Signed: 01/16/2021 6:13:14 PM By: Elliot Gurney, BSN, RN, CWS, Kim RN, BSN Entered By: Elliot Gurney, BSN, RN, CWS, Kim on 01/16/2021 15:33:45 Laver, Calvin Aver ([id number]) -------------------------------------------------------------------------------- Vitals Details Patient Name: [name] Date of Service: 01/16/2021 2:45 PM Medical Record Number: [id number] Patient Account Number: [id number] Date of Birth/Sex: Sep 16, 1975 (45 y.o. Male) Treating RN: Hansel Feinstein Primary Care Sheri Prows: SYSTEM, PCP Other Clinician: Referring Lilyanah Celestin: Duwayne Heck Treating Aila Terra/Extender: Maxwell Caul Weeks in Treatment: 4 Vital Signs Time Taken: 14:55 Temperature (F): 98.2 Height (in): 73 Pulse (bpm): 98 Weight (lbs): 260 Respiratory Rate (breaths/min): 18 Body Mass Index (BMI): 34.3 Blood Pressure (mmHg): 146/90 Reference Range: 80 - 120 mg / dl Electronic  Signature(s) Signed: 01/16/2021 4:20:07 PM By: Hansel Feinstein Entered ByHansel Feinstein on 01/16/2021 14:58:36

## 2021-01-17 NOTE — Progress Notes (Signed)
CALVYN, KURTZMAN ([id number]) Visit Report for 01/16/2021 HPI Details Patient Name: [name], [name] Date of Service: 01/16/2021 2:45 PM Medical Record Number: [id number] Patient Account Number: [id number] Date of Birth/Sex: Feb 03, 1976 (45 y.o. Male) Treating RN: Huel Coventry Primary Care Provider: SYSTEM, PCP Other Clinician: Referring Provider: Duwayne Heck Treating Provider/Extender: Maxwell Caul Weeks in Treatment: 4 History of Present Illness HPI Description: ADMISSION 12/19/2020 This is a 45 year old otherwise healthy man who had a fall at work suffering a left ankle fracture on 10/02/2020. He required operative correction with pinning. I am not exactly sure how he did in the interim how he had however he had another fall apparently in late October falling down some stairs and that is when he describes 3 separate areas opening. He is followed with Dr. Aundria Rud at [facility]. I really cannot get a sense exactly how these progressed however he is simply been putting dry gauze on this and an Ace wrap. I guess after the second fall he had a lot of swelling and Dr. Aundria Rud has put him on 2 empiric trials of Keflex. Things seem to be settling down more. He is not a diabetic does not have known arterial disease. He has had previous knee surgery. ABI in our clinic was 1.19 on the left 4/13; 3 small areas. The area on the incision line posteriorly looks as though it is about to fully epithelialized. The other 2 areas both had friable hyper granulation 1 is above the lateral malleolus and one below more in the heel area. I used silver nitrate on both of these areas. 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 He works at an [employer]al [employer] in [place] where this injury occurred while walking on ice 4/27; the patient saw orthopedics today they took him out of his cam  boot and gave him an ankle support. The larger area which was on the incision line on the left lateral ankle is fully epithelialized 2 small areas anteriorly had eschar on the surface. We have been using Hydrofera Blue 5/4; the area on the left lateral ankle incision and small areas anteriorly are all closed. Electronic Signature(s) Signed: 01/16/2021 4:35:29 PM By: Baltazar Najjar MD Entered By: Baltazar Najjar on 01/16/2021 15:37:39 [name], [name] ([id number]) -------------------------------------------------------------------------------- Physical Exam Details Patient Name: [name] Date of Service: 01/16/2021 2:45 PM Medical Record Number: [id number] Patient Account Number: [id number] Date of Birth/Sex: 1975/10/11 (45 y.o. Male) Treating RN: Huel Coventry Primary Care Provider: SYSTEM, PCP Other Clinician: Referring Provider: Duwayne Heck Treating Provider/Extender: Maxwell Caul Weeks in Treatment: 4 Constitutional Patient is hypertensive.. Pulse regular and within target range for patient.Marland Kitchen Respirations regular, non-labored and within target range.. Temperature is normal and within the target range for the patient.Marland Kitchen appears in no distress. Notes Wound exam; all of the wounds on the left lateral ankle including the larger area posteriorly are all closed. There is no swelling Electronic Signature(s) Signed: 01/16/2021 4:35:29 PM By: Baltazar Najjar MD Entered By: Baltazar Najjar on 01/16/2021 15:38:52 [name], [name] ([id number]) -------------------------------------------------------------------------------- Physician Orders Details Patient Name: [name] Date of Service: 01/16/2021 2:45 PM Medical Record Number: [id number] Patient Account Number: [id number] Date of Birth/Sex: 1976/06/17 (45 y.o. Male) Treating RN: Huel Coventry Primary Care Provider: SYSTEM, PCP Other Clinician: Referring Provider: Duwayne Heck Treating Provider/Extender: [place] in Treatment: 4 Verbal / Phone Orders: No  Diagnosis Coding ICD-10 Coding Code Description L97.328 Non-pressure chronic ulcer of left ankle with other specified severity T81.31XD Disruption of external operation (surgical) wound, not elsewhere classified, subsequent encounter Discharge From [facility] Services o Discharge from Wound Care Center Treatment Complete Electronic Signature(s) Signed: 01/16/2021 4:35:29 PM By: Baltazar Najjar MD Signed: 01/16/2021 6:13:14 PM By: Elliot Gurney, BSN, RN, CWS, Kim RN, BSN Entered By: Elliot Gurney, BSN, RN, CWS, [name] on 01/16/2021 15:36:38 [name], [name] ([id number]) -------------------------------------------------------------------------------- Problem List Details Patient Name: [name], [name] Date of Service: 01/16/2021 2:45 PM Medical Record Number: [id number] Patient Account Number: [id number] Date of Birth/Sex: Aug 26, 1976 (45 y.o. Male) Treating RN: Huel Coventry Primary Care Provider: SYSTEM, PCP Other Clinician: Referring Provider: Duwayne Heck Treating Provider/Extender: Maxwell Caul Weeks in Treatment: 4 Active Problems ICD-10 Encounter Code Description Active Date MDM Diagnosis L97.328 Non-pressure chronic ulcer of left ankle with other specified severity 12/19/2020 No Yes T81.31XD Disruption of external operation (surgical) wound, not elsewhere 12/19/2020 No Yes classified, subsequent encounter Inactive Problems Resolved Problems Electronic Signature(s) Signed: 01/16/2021 4:35:29 PM By: Baltazar Najjar MD Entered By: Baltazar Najjar on 01/16/2021 15:34:20 [name], [name] ([id number]) -------------------------------------------------------------------------------- Progress Note Details Patient Name: [name] Date of Service: 01/16/2021 2:45 PM Medical Record Number: [id number] Patient Account Number: [id number] Date of Birth/Sex: 02-Aug-1976 (45 y.o. Male) Treating RN: Huel Coventry Primary Care Provider: SYSTEM, PCP Other  Clinician: Referring Provider: Duwayne Heck Treating Provider/Extender: Maxwell Caul Weeks in Treatment: 4 Subjective History of Present Illness (HPI) ADMISSION 12/19/2020 This is a 45 year old otherwise healthy man who had a fall at work suffering a left ankle fracture on 10/02/2020. He required operative correction with pinning. I am not exactly sure how he did in the interim how he had however he had another fall apparently in late October falling down some stairs and that is when he describes 3 separate areas opening. He is followed with Dr. Aundria Rud at [facility]. I really cannot get a sense exactly how these progressed however he is simply been putting dry gauze on this and an Ace wrap. I guess after the second fall he had a lot of swelling and Dr. Aundria Rud has put him on 2 empiric trials of Keflex. Things seem to be settling down more. He is not a diabetic does not have known arterial disease. He has had previous knee surgery. ABI in our clinic was 1.19 on the left 4/13; 3 small areas. The area on the incision line posteriorly looks as though it is about to fully epithelialized. The other 2 areas both had friable hyper granulation 1 is above the lateral malleolus and one below more in the heel area. I used silver nitrate on both of these areas. 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 The larger area which was on the incision line on the left lateral ankle is fully epithelialized 2 small areas anteriorly had eschar on the surface. We have been using Hydrofera Blue 5/4; the area on the left lateral ankle incision and small areas anteriorly are all  closed. Objective Constitutional Patient is hypertensive.. Pulse regular and within target range for patient.Marland Kitchen Respirations regular, non-labored and within target range.. Temperature is normal and  within the target range for the patient.Marland Kitchen appears in no distress. Vitals Time Taken: 2:55 PM, Height: 73 in, Weight: 260 lbs, BMI: 34.3, Temperature: 98.2 F, Pulse: 98 bpm, Respiratory Rate: 18 breaths/min, Blood Pressure: 146/90 mmHg. General Notes: Wound exam; all of the wounds on the left lateral ankle including the larger area posteriorly are all closed. There is no swelling Integumentary (Hair, Skin) Wound #1 status is Healed - Epithelialized. Original cause of wound was Trauma. The date acquired was: 11/19/2020. The wound has been in treatment 4 weeks. The wound is located on the Left,Proximal Malleolus. The wound measures 0cm length x 0cm width x 0cm depth; 0cm^2 area and 0cm^3 volume. There is Fat Layer (Subcutaneous Tissue) exposed. There is no tunneling or undermining noted. There is a none present amount of drainage noted. There is no granulation within the wound bed. There is a large (67-100%) amount of necrotic tissue within the wound bed including Eschar. Wound #2 status is Healed - Epithelialized. Original cause of wound was Trauma. The date acquired was: 11/19/2020. The wound has been in treatment 4 weeks. The wound is located on the Left,Distal Malleolus. The wound measures 0cm length x 0cm width x 0cm depth; 0cm^2 area and 0cm^3 volume. There is Fat Layer (Subcutaneous Tissue) exposed. There is no tunneling or undermining noted. There is no granulation within the wound bed. There is a large (67-100%) amount of necrotic tissue within the wound bed including Eschar. Assessment Active Problems [name], [name] ([id number]) ICD-10 Non-pressure chronic ulcer of left ankle with other specified severity Disruption of external operation (surgical) wound, not elsewhere classified,  subsequent encounter Plan Discharge From [facility] Services: Discharge from Wound Care Center Treatment Complete 1. The patient to be discharged from the clinic 2. I have asked him to keep this area padded and his foot wear for at least the next month to allow maturation of the new epithelium Electronic Signature(s) Signed: 01/16/2021 4:35:29 PM By: Baltazar Najjar MD Entered By: Baltazar Najjar on 01/16/2021 15:39:11 [name], [name] ([id number]) -------------------------------------------------------------------------------- SuperBill Details Patient Name: [name] Date of Service: 01/16/2021 Medical Record Number: [id number] Patient Account Number: [id number] Date of Birth/Sex: 04/04/1976 (45 y.o. Male) Treating RN: Huel Coventry Primary Care Provider: SYSTEM, PCP Other Clinician: Referring Provider: Duwayne Heck Treating Provider/Extender: Maxwell Caul Weeks in Treatment: 4 Diagnosis Coding ICD-10 Codes Code Description [phone] Non-pressure chronic ulcer of left ankle with other specified severity T81.31XD Disruption of external operation (surgical) wound, not elsewhere classified, subsequent encounter Facility Procedures CPT4 Code: [id number] Description: [phone] - WOUND CARE VISIT-LEV 2 EST PT Modifier: Quantity: 1 Physician Procedures CPT4 Code Description: [id number] - WC PHYS LEVEL 2 - EST PT Modifier: Quantity: 1 CPT4 Code Description: ICD-10 Diagnosis Description L97.328 Non-pressure chronic ulcer of left ankle with other specified severity T81.31XD Disruption of external operation (surgical) wound, not elsewhere classi Modifier: fied, subsequent enco Quantity: Printmaker) Signed: 01/16/2021 4:35:29 PM By: Baltazar Najjar MD Entered By: Baltazar Najjar on 01/16/2021 15:39:58

## 2021-05-01 ENCOUNTER — Other Ambulatory Visit: Payer: Self-pay | Admitting: Orthopaedic Surgery

## 2021-05-01 DIAGNOSIS — M25572 Pain in left ankle and joints of left foot: Secondary | ICD-10-CM

## 2021-05-16 ENCOUNTER — Other Ambulatory Visit: Payer: Self-pay

## 2021-05-16 ENCOUNTER — Ambulatory Visit
Admission: RE | Admit: 2021-05-16 | Discharge: 2021-05-16 | Disposition: A | Discharge status: Home / Self Care | Payer: PRIVATE HEALTH INSURANCE | Source: Ambulatory Visit | Attending: Orthopaedic Surgery | Admitting: Orthopaedic Surgery

## 2021-05-16 DIAGNOSIS — M25572 Pain in left ankle and joints of left foot: Secondary | ICD-10-CM

## 2021-05-29 ENCOUNTER — Other Ambulatory Visit: Payer: Self-pay | Admitting: Orthopaedic Surgery

## 2021-06-04 NOTE — Pre-Procedure Instructions (Signed)
Surgical Instructions    Your procedure is scheduled on Thursday, September 29th.  Report to [facility] Main Entrance "A" at 12:30 P.M., then check in with the Admitting office.  Call this number if you have problems the morning of surgery:  [phone]   If you have any questions prior to your surgery date call [phone]: Open Monday-Friday 8am-4pm    Remember:  Do not eat after midnight the night before your surgery  You may drink clear liquids until 11:30 a.m. the morning of your surgery.   Clear liquids allowed are: Water, Non-Citrus Juices (without pulp), Carbonated Beverages, Clear Tea, Black Coffee Only, and Gatorade.   Enhanced Recovery after Surgery for Orthopedics Enhanced Recovery after Surgery is a protocol used to improve the stress on your body and your recovery after surgery.  Patient Instructions  The day of surgery (if you do NOT have diabetes):  Drink ONE (1) Pre-Surgery Clear Ensure by 12:30 a.m. the morning of surgery   This drink was given to you during your hospital  pre-op appointment visit. Nothing else to drink after completing the  Pre-Surgery Clear Ensure.         If you have questions, please contact your surgeon's office.    There are no medications that you need to take the morning of surgery.    As of today, STOP taking any Aspirin (unless otherwise instructed by your surgeon) Aleve, Naproxen, Ibuprofen, Motrin, Advil, Goody's, BC's, all herbal medications, fish oil, and all vitamins. This includes: diclofenac Sodium (VOLTAREN) 1 % GEL.                  Do NOT Smoke (Tobacco/Vaping) or drink Alcohol 24 hours prior to your procedure.  If you use a CPAP at night, you may bring all equipment for your overnight stay.   Contacts, glasses, piercing's, hearing aid's, dentures or partials may not be worn into surgery, please bring cases for these belongings.    For patients admitted to the hospital, discharge time will be determined by your  treatment team.   Patients discharged the day of surgery will not be allowed to drive home, and someone needs to stay with them for 24 hours.  ONLY 1 SUPPORT PERSON MAY BE PRESENT WHILE YOU ARE IN SURGERY. IF YOU ARE TO BE ADMITTED ONCE YOU ARE IN YOUR ROOM YOU WILL BE ALLOWED TWO (2) VISITORS.  Minor children may have two parents present. Special consideration for safety and communication needs will be reviewed on a case by case basis.   Special instructions:   [facility]- Preparing For Surgery  Before surgery, you can play an important role. Because skin is not sterile, your skin needs to be as free of germs as possible. You can reduce the number of germs on your skin by washing with CHG (chlorahexidine gluconate) Soap before surgery.  CHG is an antiseptic cleaner which kills germs and bonds with the skin to continue killing germs even after washing.    Oral Hygiene is also important to reduce your risk of infection.  Remember - BRUSH YOUR TEETH THE MORNING OF SURGERY WITH YOUR REGULAR TOOTHPASTE  Please do not use if you have an allergy to CHG or antibacterial soaps. If your skin becomes reddened/irritated stop using the CHG.  Do not shave (including legs and underarms) for at least 48 hours prior to first CHG shower. It is OK to shave your face.  Please follow these instructions carefully.   Shower the [employer] BEFORE SURGERY  and the MORNING OF SURGERY  If you chose to wash your hair, wash your hair first as usual with your normal shampoo.  After you shampoo, rinse your hair and body thoroughly to remove the shampoo.  Use CHG Soap as you would any other liquid soap. You can apply CHG directly to the skin and wash gently with a scrungie or a clean washcloth.   Apply the CHG Soap to your body ONLY FROM THE NECK DOWN.  Do not use on open wounds or open sores. Avoid contact with your eyes, ears, mouth and genitals (private parts). Wash Face and genitals (private parts)  with your normal  soap.   Wash thoroughly, paying special attention to the area where your surgery will be performed.  Thoroughly rinse your body with warm water from the neck down.  DO NOT shower/wash with your normal soap after using and rinsing off the CHG Soap.  Pat yourself dry with a CLEAN TOWEL.  Wear CLEAN PAJAMAS to bed the night before surgery  Place CLEAN SHEETS on your bed the night before your surgery  DO NOT SLEEP WITH PETS.   Day of Surgery: Shower with CHG soap. Do not wear jewelry. Do not wear lotions, powders, colognes, or deodorant. Men may shave face and neck. Do not bring valuables to the hospital. [facility] is not responsible for any belongings or valuables. Wear Clean/Comfortable clothing the morning of surgery Remember to brush your teeth WITH YOUR REGULAR TOOTHPASTE.   Please read over the following fact sheets that you were given.

## 2021-06-05 ENCOUNTER — Encounter (HOSPITAL_COMMUNITY): Payer: Self-pay

## 2021-06-05 ENCOUNTER — Other Ambulatory Visit: Payer: Self-pay

## 2021-06-05 ENCOUNTER — Encounter (HOSPITAL_COMMUNITY)
Admission: RE | Admit: 2021-06-05 | Discharge: 2021-06-05 | Disposition: A | Discharge status: Home / Self Care | Payer: Worker's Compensation | Source: Ambulatory Visit | Attending: Orthopaedic Surgery | Admitting: Orthopaedic Surgery

## 2021-06-05 DIAGNOSIS — Z01818 Encounter for other preprocedural examination: Secondary | ICD-10-CM | POA: Insufficient documentation

## 2021-06-05 DIAGNOSIS — I10 Essential (primary) hypertension: Secondary | ICD-10-CM | POA: Insufficient documentation

## 2021-06-05 HISTORY — DX: Personal history of urinary calculi: Z87.442

## 2021-06-05 HISTORY — DX: Essential (primary) hypertension: I10

## 2021-06-05 LAB — BASIC METABOLIC PANEL
Anion gap: 10 (ref 5–15)
BUN: 9 mg/dL (ref 6–20)
CO2: 21 mmol/L — ABNORMAL LOW (ref 22–32)
Calcium: 8.8 mg/dL — ABNORMAL LOW (ref 8.9–10.3)
Chloride: 106 mmol/L (ref 98–111)
Creatinine, Ser: 0.97 mg/dL (ref 0.61–1.24)
GFR, Estimated: >60 mL/min (ref >60)
Glucose, Bld: 101 mg/dL — ABNORMAL HIGH (ref 70–99)
Potassium: 4.2 mmol/L (ref 3.5–5.1)
Sodium: 137 mmol/L (ref 135–145)

## 2021-06-05 LAB — CBC
HCT: 46.2 % (ref 39.0–52.0)
Hemoglobin: 15.2 g/dL (ref 13.0–17.0)
MCH: 29 pg (ref 26.0–34.0)
MCHC: 32.9 g/dL (ref 30.0–36.0)
MCV: 88.2 fL (ref 80.0–100.0)
Platelets: 255 10*3/uL (ref 150–400)
RBC: 5.24 MIL/uL (ref 4.22–5.81)
RDW: 13.6 % (ref 11.5–15.5)
WBC: 11 10*3/uL — ABNORMAL HIGH (ref 4.0–10.5)
nRBC: 0 % (ref 0.0–0.2)

## 2021-06-05 LAB — SURGICAL PCR SCREEN
MRSA, PCR: NEGATIVE
Staphylococcus aureus: NEGATIVE

## 2021-06-05 NOTE — Progress Notes (Signed)
PCP: NONE Cardiologist: denies  EKG: Today CXR: n/a ECHO: denies Stress Test: denies Cardiac Cath: denies  ERAS drink provided, finish by 11:30 am  NO smoking for 24 hours prior to surgery  Ambulatory surgery, no Covid test needed  Patient denies shortness of breath, fever, cough, and chest pain at PAT appointment.  Patient verbalized understanding of instructions provided today at the PAT appointment.  Patient asked to review instructions at home and day of surgery.

## 2021-06-24 NOTE — Progress Notes (Signed)
[name] on 06/05/21, surgery date was changed nby Dr. Garlon Hatchet, patient will arrive at 0600 on 06/25/21.  [name] denies any change in medical health since that time.  Patient has instructions, CHG soap and Pre- SUrgery ensure- he is aware to stop drinking by 0530.

## 2021-06-25 ENCOUNTER — Other Ambulatory Visit: Payer: Self-pay

## 2021-06-25 ENCOUNTER — Ambulatory Visit (HOSPITAL_COMMUNITY): Payer: Worker's Compensation

## 2021-06-25 ENCOUNTER — Encounter (HOSPITAL_COMMUNITY): Payer: Self-pay | Admitting: Orthopaedic Surgery

## 2021-06-25 ENCOUNTER — Ambulatory Visit (HOSPITAL_COMMUNITY): Payer: Worker's Compensation | Admitting: Certified Registered"

## 2021-06-25 ENCOUNTER — Encounter (HOSPITAL_COMMUNITY)
Admission: RE | Disposition: A | Discharge status: Home / Self Care | Payer: Self-pay | Source: Home / Self Care | Attending: Orthopaedic Surgery

## 2021-06-25 ENCOUNTER — Ambulatory Visit (HOSPITAL_COMMUNITY)
Admission: RE | Admit: 2021-06-25 | Discharge: 2021-06-25 | Disposition: A | Discharge status: Home / Self Care | Payer: Worker's Compensation | Attending: Orthopaedic Surgery | Admitting: Orthopaedic Surgery

## 2021-06-25 DIAGNOSIS — I10 Essential (primary) hypertension: Secondary | ICD-10-CM | POA: Diagnosis not present

## 2021-06-25 DIAGNOSIS — M879 Osteonecrosis, unspecified: Secondary | ICD-10-CM | POA: Diagnosis not present

## 2021-06-25 DIAGNOSIS — M25572 Pain in left ankle and joints of left foot: Secondary | ICD-10-CM | POA: Diagnosis not present

## 2021-06-25 DIAGNOSIS — Z791 Long term (current) use of non-steroidal anti-inflammatories (NSAID): Secondary | ICD-10-CM | POA: Insufficient documentation

## 2021-06-25 DIAGNOSIS — F17210 Nicotine dependence, cigarettes, uncomplicated: Secondary | ICD-10-CM | POA: Insufficient documentation

## 2021-06-25 DIAGNOSIS — Z419 Encounter for procedure for purposes other than remedying health state, unspecified: Secondary | ICD-10-CM

## 2021-06-25 HISTORY — PX: ANKLE ARTHROSCOPY WITH ARTHRODESIS: SHX5579

## 2021-06-25 SURGERY — ARTHROSCOPY, ANKLE, WITH FUSION
Anesthesia: Regional | Site: Ankle | Laterality: Left

## 2021-06-25 MED ORDER — LACTATED RINGERS IV SOLN: INTRAVENOUS | Status: DC

## 2021-06-25 MED ORDER — FENTANYL CITRATE (PF) 100 MCG/2ML IJ SOLN: 25.0000 ug | INTRAMUSCULAR | Status: DC | PRN

## 2021-06-25 MED ORDER — LIDOCAINE 2% (20 MG/ML) 5 ML SYRINGE
INTRAMUSCULAR | Status: DC | PRN
  Administered 2021-06-25: 100 mg via INTRAVENOUS

## 2021-06-25 MED ORDER — BUPIVACAINE-EPINEPHRINE (PF) 0.5% -1:200000 IJ SOLN
INTRAMUSCULAR | Status: DC | PRN
  Administered 2021-06-25: 30 mL via PERINEURAL
  Administered 2021-06-25: 12 mL via PERINEURAL

## 2021-06-25 MED ORDER — OXYCODONE HCL 5 MG PO TABS
ORAL_TABLET | ORAL | Status: AC
  Filled 2021-06-25: qty 1

## 2021-06-25 MED ORDER — MIDAZOLAM HCL 2 MG/2ML IJ SOLN
INTRAMUSCULAR | Status: DC | PRN
  Administered 2021-06-25: 2 mg via INTRAVENOUS

## 2021-06-25 MED ORDER — ONDANSETRON HCL 4 MG/2ML IJ SOLN
INTRAMUSCULAR | Status: DC | PRN
  Administered 2021-06-25: 4 mg via INTRAVENOUS

## 2021-06-25 MED ORDER — FENTANYL CITRATE (PF) 250 MCG/5ML IJ SOLN
INTRAMUSCULAR | Status: AC
  Filled 2021-06-25: qty 5

## 2021-06-25 MED ORDER — PROPOFOL 10 MG/ML IV BOLUS
INTRAVENOUS | Status: AC
  Filled 2021-06-25: qty 20

## 2021-06-25 MED ORDER — ACETAMINOPHEN 10 MG/ML IV SOLN: 1000.0000 mg | Freq: Once | INTRAVENOUS | Status: DC | PRN

## 2021-06-25 MED ORDER — VANCOMYCIN HCL 500 MG IV SOLR
INTRAVENOUS | Status: DC | PRN
  Administered 2021-06-25: 500 mg

## 2021-06-25 MED ORDER — CEFAZOLIN SODIUM-DEXTROSE 2-4 GM/100ML-% IV SOLN
2.0000 g | INTRAVENOUS | Status: AC
  Administered 2021-06-25: 2 g via INTRAVENOUS

## 2021-06-25 MED ORDER — ACETAMINOPHEN 500 MG PO TABS: 1000.0000 mg | ORAL_TABLET | Freq: Once | ORAL | Status: DC | PRN

## 2021-06-25 MED ORDER — ASPIRIN 325 MG PO TABS: 325.0000 mg | ORAL_TABLET | Freq: Every day | ORAL | 0 refills | Status: AC

## 2021-06-25 MED ORDER — OXYCODONE HCL 5 MG/5ML PO SOLN: 5.0000 mg | Freq: Once | ORAL | Status: AC | PRN

## 2021-06-25 MED ORDER — PROPOFOL 10 MG/ML IV BOLUS
INTRAVENOUS | Status: DC | PRN
  Administered 2021-06-25: 160 mg via INTRAVENOUS

## 2021-06-25 MED ORDER — ONDANSETRON HCL 4 MG/2ML IJ SOLN
INTRAMUSCULAR | Status: AC
  Filled 2021-06-25: qty 2

## 2021-06-25 MED ORDER — ORAL CARE MOUTH RINSE: 15.0000 mL | Freq: Once | OROMUCOSAL | Status: AC

## 2021-06-25 MED ORDER — ACETAMINOPHEN 160 MG/5ML PO SOLN: 1000.0000 mg | Freq: Once | ORAL | Status: DC | PRN

## 2021-06-25 MED ORDER — VANCOMYCIN HCL 500 MG IV SOLR
INTRAVENOUS | Status: AC
  Filled 2021-06-25: qty 10

## 2021-06-25 MED ORDER — BUPIVACAINE-EPINEPHRINE 0.5% -1:200000 IJ SOLN
INTRAMUSCULAR | Status: AC
  Filled 2021-06-25: qty 1

## 2021-06-25 MED ORDER — OXYCODONE HCL 5 MG PO TABS
5.0000 mg | ORAL_TABLET | Freq: Once | ORAL | Status: AC | PRN
  Administered 2021-06-25: 5 mg via ORAL

## 2021-06-25 MED ORDER — DEXAMETHASONE SODIUM PHOSPHATE 10 MG/ML IJ SOLN
INTRAMUSCULAR | Status: AC
  Filled 2021-06-25: qty 1

## 2021-06-25 MED ORDER — BUPIVACAINE-EPINEPHRINE 0.5% -1:200000 IJ SOLN
INTRAMUSCULAR | Status: DC | PRN
  Administered 2021-06-25: 1 mL

## 2021-06-25 MED ORDER — FENTANYL CITRATE (PF) 250 MCG/5ML IJ SOLN
INTRAMUSCULAR | Status: DC | PRN
  Administered 2021-06-25 (×4): 50 ug via INTRAVENOUS

## 2021-06-25 MED ORDER — LIDOCAINE 2% (20 MG/ML) 5 ML SYRINGE
INTRAMUSCULAR | Status: AC
  Filled 2021-06-25: qty 5

## 2021-06-25 MED ORDER — DEXAMETHASONE SODIUM PHOSPHATE 10 MG/ML IJ SOLN
INTRAMUSCULAR | Status: DC | PRN
  Administered 2021-06-25: 10 mg via INTRAVENOUS

## 2021-06-25 MED ORDER — MIDAZOLAM HCL 2 MG/2ML IJ SOLN
INTRAMUSCULAR | Status: AC
  Filled 2021-06-25: qty 2

## 2021-06-25 MED ORDER — CEFAZOLIN SODIUM-DEXTROSE 2-4 GM/100ML-% IV SOLN
INTRAVENOUS | Status: AC
  Filled 2021-06-25: qty 100

## 2021-06-25 MED ORDER — CHLORHEXIDINE GLUCONATE 0.12 % MT SOLN: 15.0000 mL | Freq: Once | OROMUCOSAL | Status: AC

## 2021-06-25 MED ORDER — EPHEDRINE SULFATE-NACL 50-0.9 MG/10ML-% IV SOSY
PREFILLED_SYRINGE | INTRAVENOUS | Status: DC | PRN
  Administered 2021-06-25 (×2): 10 mg via INTRAVENOUS

## 2021-06-25 MED ORDER — 0.9 % SODIUM CHLORIDE (POUR BTL) OPTIME
TOPICAL | Status: DC | PRN
  Administered 2021-06-25: 1000 mL

## 2021-06-25 MED ORDER — CHLORHEXIDINE GLUCONATE 0.12 % MT SOLN
OROMUCOSAL | Status: AC
  Administered 2021-06-25: 15 mL via OROMUCOSAL
  Filled 2021-06-25: qty 15

## 2021-06-25 MED ORDER — OXYCODONE HCL 5 MG PO TABS: 5.0000 mg | ORAL_TABLET | ORAL | 0 refills | Status: AC | PRN

## 2021-06-25 MED ORDER — FENTANYL CITRATE (PF) 100 MCG/2ML IJ SOLN
INTRAMUSCULAR | Status: AC
  Filled 2021-06-25: qty 2

## 2021-06-25 SURGICAL SUPPLY — 80 items
BAG COUNTER SPONGE SURGICOUNT (BAG) IMPLANT
BANDAGE ESMARK 6X9 LF (GAUZE/BANDAGES/DRESSINGS) IMPLANT
BENZOIN TINCTURE PRP APPL 2/3 (GAUZE/BANDAGES/DRESSINGS) IMPLANT
BIT DRILL CANN COMP 5.0 (BIT) ×2 IMPLANT
BIT DRILL CANN LNG FLUTE 3.0 (BIT) ×1 IMPLANT
BIT DRILL CANNULATED 3.0 (BIT) ×2
BIT DRILL SOLID LONG 5.5 (BIT) ×2 IMPLANT
BLADE SURG 15 STRL LF DISP TIS (BLADE) ×2 IMPLANT
BLADE SURG 15 STRL SS (BLADE) ×4
BNDG ELASTIC 6X10 VLCR STRL LF (GAUZE/BANDAGES/DRESSINGS) ×2 IMPLANT
BNDG ESMARK 6X9 LF (GAUZE/BANDAGES/DRESSINGS)
BONE CANC CHIPS 20CC PCAN1/4 (Bone Implant) ×2 IMPLANT
BUCKET PLASTER CAST PAPER DISP (CAST SUPPLIES) ×2 IMPLANT
CHIPS CANC BONE 20CC PCAN1/4 (Bone Implant) ×1 IMPLANT
CHLORAPREP W/TINT 26 (MISCELLANEOUS) ×2 IMPLANT
CNTNR URN SCR LID CUP LEK RST (MISCELLANEOUS) ×1 IMPLANT
CONT SPEC 4OZ STRL OR WHT (MISCELLANEOUS) ×1
CUFF TOURN SGL QUICK 34 (TOURNIQUET CUFF) ×1
CUFF TRNQT CYL 34X4.125X (TOURNIQUET CUFF) ×1 IMPLANT
DECANTER SPIKE VIAL GLASS SM (MISCELLANEOUS) IMPLANT
DRAPE C-ARM 42X72 X-RAY (DRAPES) ×2 IMPLANT
DRAPE C-ARMOR (DRAPES) ×2 IMPLANT
DRAPE EXTREMITY T 121X128X90 (DISPOSABLE) ×2 IMPLANT
DRAPE IMP U-DRAPE 54X76 (DRAPES) ×2 IMPLANT
DRAPE U-SHAPE 47X51 STRL (DRAPES) ×2 IMPLANT
DRSG PAD ABDOMINAL 8X10 ST (GAUZE/BANDAGES/DRESSINGS) IMPLANT
ELECT REM PT RETURN 9FT ADLT (ELECTROSURGICAL) ×2
ELECTRODE REM PT RTRN 9FT ADLT (ELECTROSURGICAL) ×1 IMPLANT
GAUZE SPONGE 4X4 12PLY STRL (GAUZE/BANDAGES/DRESSINGS) ×2 IMPLANT
GAUZE SPONGE 4X4 12PLY STRL LF (GAUZE/BANDAGES/DRESSINGS) ×2 IMPLANT
GAUZE XEROFORM 1X8 LF (GAUZE/BANDAGES/DRESSINGS) ×2 IMPLANT
GLOVE SRG 8 PF TXTR STRL LF DI (GLOVE) ×1 IMPLANT
GLOVE SURG ENC TEXT LTX SZ7.5 (GLOVE) ×2 IMPLANT
GLOVE SURG MICRO LTX SZ6.5 (GLOVE) ×2 IMPLANT
GLOVE SURG ORTHO LTX SZ7 (GLOVE) ×4 IMPLANT
GLOVE SURG POLY MICRO LF SZ6 (GLOVE) ×4 IMPLANT
GLOVE SURG POLY ORTHO LF SZ7 (GLOVE) ×2 IMPLANT
GLOVE SURG UNDER POLY LF SZ7 (GLOVE) ×2 IMPLANT
GLOVE SURG UNDER POLY LF SZ8 (GLOVE) ×1
GOWN STRL REUS W/ TWL LRG LVL3 (GOWN DISPOSABLE) ×2 IMPLANT
GOWN STRL REUS W/ TWL XL LVL3 (GOWN DISPOSABLE) ×1 IMPLANT
GOWN STRL REUS W/TWL LRG LVL3 (GOWN DISPOSABLE) ×2
GOWN STRL REUS W/TWL XL LVL3 (GOWN DISPOSABLE) ×1
GUIDEWIRE W/TRCR TIP 2.4X9.25 (WIRE) ×6 IMPLANT
INSTRUMENT LRG BB TAK DISP (EXFIX) ×4 IMPLANT
KIT BASIN OR (CUSTOM PROCEDURE TRAY) ×2 IMPLANT
KIT INFUSE X SMALL 1.4CC (Orthopedic Implant) ×2 IMPLANT
NEEDLE HYPO 25GX1X1/2 BEV (NEEDLE) ×2 IMPLANT
NS IRRIG 1000ML POUR BTL (IV SOLUTION) ×2 IMPLANT
PACK ORTHO EXTREMITY (CUSTOM PROCEDURE TRAY) ×2 IMPLANT
PAD CAST 4YDX4 CTTN HI CHSV (CAST SUPPLIES) ×2 IMPLANT
PADDING CAST COTTON 4X4 STRL (CAST SUPPLIES) ×2
PADDING CAST SYNTHETIC 4 (CAST SUPPLIES) ×1
PADDING CAST SYNTHETIC 4X4 STR (CAST SUPPLIES) ×1 IMPLANT
PLATE ANT FUS ANKLE TT LT (Plate) ×2 IMPLANT
SCREW BONE LOCK LP 4.5X28 (Screw) ×2 IMPLANT
SCREW BONE LP TI 5.5X55 (Screw) ×2 IMPLANT
SCREW BONE TI LP 4.5X30 (Screw) ×2 IMPLANT
SCREW CANN THRD 7X50 (Screw) ×2 IMPLANT
SCREW CANN THRD 7X55 (Screw) ×2 IMPLANT
SCREW LOCK 4.5X40 (Screw) ×1 IMPLANT
SCREW LOCK 40X4.5X ANKL TI (Screw) ×1 IMPLANT
SCREW LOCK LOW PRO 4.5X32 (Screw) ×2 IMPLANT
SCREW LOCK LOW PRO 4.5X36 (Screw) ×2 IMPLANT
SCREW LOCK LP 4.5X30 (Screw) ×2 IMPLANT
SPLINT PLASTER CAST XFAST 5X30 (CAST SUPPLIES) ×2 IMPLANT
SPLINT PLASTER XFAST SET 5X30 (CAST SUPPLIES) ×2
SPONGE T-LAP 18X18 ~~LOC~~+RFID (SPONGE) IMPLANT
STRIP CLOSURE SKIN 1/2X4 (GAUZE/BANDAGES/DRESSINGS) IMPLANT
SUCTION FRAZIER HANDLE 10FR (MISCELLANEOUS) ×1
SUCTION TUBE FRAZIER 10FR DISP (MISCELLANEOUS) ×1 IMPLANT
SUT ETHILON 3 0 PS 1 (SUTURE) ×2 IMPLANT
SUT MNCRL AB 3-0 PS2 18 (SUTURE) ×2 IMPLANT
SUT PDS AB 2-0 CT2 27 (SUTURE) ×2 IMPLANT
SUT VIC AB 0 CT1 27 (SUTURE)
SUT VIC AB 0 CT1 27XBRD ANBCTR (SUTURE) IMPLANT
SYR CONTROL 10ML LL (SYRINGE) ×2 IMPLANT
TOWEL GREEN STERILE FF (TOWEL DISPOSABLE) ×4 IMPLANT
TUBE CONNECTING 20X1/4 (TUBING) ×4 IMPLANT
UNDERPAD 30X36 HEAVY ABSORB (UNDERPADS AND DIAPERS) ×2 IMPLANT

## 2021-06-25 NOTE — Anesthesia Procedure Notes (Addendum)
Anesthesia Regional Block: Popliteal block   Pre-Anesthetic Checklist: , timeout performed,  Correct Patient, Correct Site, Correct Laterality,  Correct Procedure, Correct Position, site marked,  Risks and benefits discussed,  Surgical consent,  Pre-op evaluation,  At surgeon's request and post-op pain management  Laterality: Left and Lower  Prep: chloraprep       Needles:  Injection technique: Single-shot      Needle Length: 9cm  Needle Gauge: 22     Additional Needles: Arrow StimuQuik ECHO Echogenic Stimulating PNB Needle  Procedures:,,,, ultrasound used (permanent image in chart),,    Narrative:  Start time: 06/25/2021 8:47 AM End time: 06/25/2021 8:51 AM Injection made incrementally with aspirations every 5 mL.  Performed by: Personally  Anesthesiologist: Val Eagle, MD

## 2021-06-25 NOTE — Anesthesia Procedure Notes (Signed)
Procedure Name: LMA Insertion Date/Time: 06/25/2021 9:04 AM Performed by: Rosiland Oz, CRNA Pre-anesthesia Checklist: Patient identified, Emergency Drugs available, Suction available, Patient being monitored and Timeout performed Patient Re-evaluated:Patient Re-evaluated prior to induction Oxygen Delivery Method: Circle system utilized Preoxygenation: Pre-oxygenation with 100% oxygen Induction Type: IV induction LMA: LMA inserted LMA Size: 5.0 Number of attempts: 1 Placement Confirmation: positive ETCO2 and breath sounds checked- equal and bilateral Tube secured with: Tape Dental Injury: Teeth and Oropharynx as per pre-operative assessment

## 2021-06-25 NOTE — Anesthesia Preprocedure Evaluation (Signed)
Anesthesia Evaluation  Patient identified by MRN, date of birth, ID band Patient awake    Reviewed: Allergy & Precautions, NPO status , Patient's Chart, lab work & pertinent test results  History of Anesthesia Complications Negative for: history of anesthetic complications  Airway Mallampati: III  TM Distance: >3 FB Neck ROM: Full    Dental  (+) Dental Advisory Given, Teeth Intact, Missing,    Pulmonary neg shortness of breath, neg sleep apnea, neg COPD, neg recent URI, Current SmokerPatient did not abstain from smoking.,    breath sounds clear to auscultation       Cardiovascular hypertension, (-) angina(-) Past MI and (-) CHF (-) dysrhythmias  Rhythm:Regular     Neuro/Psych negative neurological ROS  negative psych ROS   GI/Hepatic negative GI ROS, Neg liver ROS,   Endo/Other  negative endocrine ROS  Renal/GU negative Renal ROS     Musculoskeletal  LEFT ANKLE AVASCULAR NECROSIS, RETAINED HARDWARE   Abdominal   Peds  Hematology negative hematology ROS (+)   Anesthesia Other Findings   Reproductive/Obstetrics                             Anesthesia Physical Anesthesia Plan  ASA: 2  Anesthesia Plan: General and Regional   Post-op Pain Management:  Regional for Post-op pain   Induction: Intravenous  PONV Risk Score and Plan: 1 and Ondansetron and Dexamethasone  Airway Management Planned: Oral ETT and LMA  Additional Equipment: None  Intra-op Plan:   Post-operative Plan: Extubation in OR  Informed Consent: I have reviewed the patients History and Physical, chart, labs and discussed the procedure including the risks, benefits and alternatives for the proposed anesthesia with the patient or authorized representative who has indicated his/her understanding and acceptance.     Dental advisory given  Plan Discussed with: CRNA and Anesthesiologist  Anesthesia Plan Comments:          Anesthesia Quick Evaluation

## 2021-06-25 NOTE — Op Note (Signed)
[name] male 45 y.o. 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  IMPLANTS: Arthrex distal tibiotalar arthrodesis plate 7.0 mm headed partially-threaded cannulated screw  INDICATIONS:45 y.o. male sustained an ankle fracture and underwent open treatment by outside provider.  Unfortunately he developed AVN of the distal tibia and talus and had collapse and widening of the syndesmosis.  He had valgus through the ankle and hindfoot and had subfibular impingement.  Given these findings he was indicated for ankle arthrodesis.  Patient is a known smoker and we discussed the risks involved with smoking as far as nonunion and skin and wound healing problems.  He was willing to accept the increased risk of complication given his smoking status.   Patient understood the risks, benefits and alternatives to surgery which include but are not limited to wound healing complications, infection, nonunion, malunion, need for further surgery as well as damage to surrounding structures. They also understood the potential for continued pain in that there were no guarantees of acceptable outcome After weighing these risks the patient opted to proceed with surgery.  PROCEDURE: Patient was identified in the preoperative holding area.  The left ankle was marked by myself.  Consent was signed by myself and the patient.  Block was performed by anesthesia in the preoperative holding area.  Patient was taken to the operative suite and placed supine on the operative table.  General anesthesia was induced without difficulty. Bump was placed under the operative hip and bone  foam was used.  All bony prominences were well padded.  Tourniquet was placed on the operative thigh.  Preoperative antibiotics were given. The extremity was prepped and draped in the usual sterile fashion and surgical timeout was performed.  The limb was elevated and the tourniquet was inflated to 250 mmHg.   Began by making a longitudinal incision overlying the anterior ankle joint.  It was taken sharply down through skin and subcutaneous tissue.  Blunt dissection was used to mobilize skin bleeders.  Skin flaps were created.  The extensor retinaculum was identified and incised in line with the incision.  This is then taken down to the tibialis anterior tendon sheath.  The tendon was retracted.  Dissection was carried further down to the bone and into the ankle joint.  Then subperiosteal flaps were created medially and laterally to gain access to the joint.  An osteotome was used to remove bone spurs on the anterior aspect of the ankle joint.  Then the ankle was mobilized.  It was noted that there was collapse of the lateral aspect of the tibial plafond and of the talar dome.  The cartilage was removed from the dorsal aspect of the talus on the undersurface of the tibial plafond.  Then fibrous tissue that was within the collapsed portion of the bone was removed as well.  Then the ankle was inspected.  The tibia and talus were then trephinated using a drill.  The ankle joint was reduced in acceptable position and held provisionally with K wire fixation.  Then an anterior tibiotalar joint arthrodesis plate was placed with a combination of locking and nonlocking screws.  Then a separate 7.0 mm partially-threaded cannulated screw was placed from  medial to lateral across the joint for compression.  There was good stability of the joint compression across the joint after fixation.  Fluoroscopy confirmed appropriate position of the plate and screws.  Then the ankle joint was irrigated with normal saline.  Bone graft  material was placed within the defects that remained after reduction and compression.  This was done with cancellous bone chips and infuse.  Then the joint was reirrigated.  The joint capsular tissue was closed with a 2-0 PDS suture.  The extensor retinaculum was closed with 2-0 PDS.  The skin and subcutaneous tissue was closed in a layered fashion using 3-0 Monocryl and 3-0 nylon.  The patient was placed into a short leg nonweightbearing splint after soft dressing was placed.  Tourniquet was released.   POST OPERATIVE INSTRUCTIONS: Nonweightbearing to operative extremity Keep splint dry and intact He will follow-up in 2 weeks for splint removal, suture removal if appropriate and x-rays of the ankle nonweightbearing He will be placed into a short leg nonweightbearing cast   TOURNIQUET TIME: 105 minutes  BLOOD LOSS:  less than 50 mL         DRAINS: none         SPECIMEN: none       COMPLICATIONS:  * No complications entered in OR log *         Disposition: PACU - hemodynamically stable.         Condition: stable

## 2021-06-25 NOTE — Discharge Instructions (Signed)
DR. [facility] POST-OP INSTRUCTIONS   Pain Management The numbing medicine and your leg will last around 18 hours, take a dose of your pain medicine as soon as you feel it wearing off to avoid rebound pain. Keep your foot elevated above heart level.  Make sure that your heel hangs free ('floats'). Take all prescribed medication as directed. If taking narcotic pain medication you may want to use an over-the-counter stool softener to avoid constipation. You may take over-the-counter NSAIDs (ibuprofen, naproxen, etc.) as well as over-the-counter acetaminophen as directed on the packaging as a supplement for your pain and may also use it to wean away from the prescription medication.  Activity Non-weightbearing Keep splint intact  First Postoperative Visit Your first postop visit will be at least 2 weeks after surgery.  This should be scheduled when you schedule surgery. If you do not have a postoperative visit scheduled please call [phone] to schedule an appointment. At the appointment your incision will be evaluated for suture removal, x-rays will be obtained if necessary.  General Instructions Swelling is very common after foot and ankle surgery.  It often takes 3 months for the foot and ankle to begin to feel comfortable.  Some amount of swelling will persist for 6-12 months. DO NOT change the dressing.  If there is a problem with the dressing (too tight, loose, gets wet, etc.) please contact Dr. Fate Galanti's office. DO NOT get the dressing wet.  For showers you can use an over-the-counter cast cover or wrap a washcloth around the top of your dressing and then cover it with a plastic bag and tape it to your leg. DO NOT soak the incision (no tubs, pools, bath, etc.) until you have approval from Dr. Tovia Kisner.  Contact Dr. Adairs office or go to Emergency Room if: Temperature above 101 F. Increasing pain that is unresponsive to pain medication or elevation Excessive redness or  swelling in your foot Dressing problems - excessive bloody drainage, looseness or tightness, or if dressing gets wet Develop pain, swelling, warmth, or discoloration of your calf  

## 2021-06-25 NOTE — Transfer of Care (Signed)
Immediate Anesthesia Transfer of Care Note  Patient: [name]  Procedure(s) Performed: LEFT ANKLE ARTHRODESIS, (Left: Ankle)  Patient Location: PACU  Anesthesia Type:GA combined with regional for post-op pain  Level of Consciousness: drowsy and patient cooperative  Airway & Oxygen Therapy: Patient Spontanous Breathing  Post-op Assessment: Report given to RN and Post -op Vital signs reviewed and stable  Post vital signs: Reviewed and stable  Last Vitals:  Vitals Value Taken Time  BP    Temp    Pulse 95 06/25/21 1119  Resp 19 06/25/21 1119  SpO2 91 % 06/25/21 1119  Vitals shown include unvalidated device data.  Last Pain:  Vitals:   06/25/21 0636  TempSrc:   PainSc: 2       Patients Stated Pain Goal: 3 (06/25/21 0636)  Complications: No notable events documented.

## 2021-06-25 NOTE — H&P (Signed)
PREOPERATIVE H&P  Chief Complaint: Left ankle pain  HPI: [name] is a 45 y.o. male who presents for preoperative history and physical with a diagnosis of left ankle collapse and AVN after he sustained a trimalleolar ankle fracture several months ago and underwent open treatment.  He has valgus deformity through his ankle and CT evidence of AVN of the distal lateral tibia and dorsal talus. Symptoms are rated as moderate to severe, and have been worsening.  This is significantly impairing activities of daily living.  He has elected for surgical management.   Past Medical History:  Diagnosis Date   History of kidney stones    Hypertension    per pt, resolved after weight loss   Past Surgical History:  Procedure Laterality Date   INGUINAL HERNIA REPAIR Right 2008   KNEE ARTHROSCOPY Right 1994   ORIF ANKLE FRACTURE Left 10/02/2020   Procedure: OPEN REDUCTION INTERNAL FIXATION (ORIF) ANKLE FRACTURE;  Surgeon: Yolonda Kida, MD;  Location: [facility] OR;  Service: Orthopedics;  Laterality: Left;   VASECTOMY     Social History   Socioeconomic History   Marital status: Single    Spouse name: Not on file   Number of children: Not on file   Years of education: Not on file   Highest education level: Not on file  Occupational History   Not on file  Tobacco Use   Smoking status: Every Day    Packs/day: 1.00    Types: Cigarettes   Smokeless tobacco: Never  Vaping Use   Vaping Use: Some days   Substances: Nicotine  Substance and Sexual Activity   Alcohol use: Yes    Comment: "lightly" occasional   Drug use: Never   Sexual activity: Not on file  Other Topics Concern   Not on file  Social History Narrative   Not on file   Social Determinants of Health   Financial Resource Strain: Not on file  Food Insecurity: Not on file  Transportation Needs: Not on file  Physical Activity: Not on file  Stress: Not on file  Social Connections: Not on file   History reviewed. No  pertinent family history. Allergies  Allergen Reactions   Tomato Nausea And Vomiting   Prior to Admission medications   Medication Sig Start Date End Date Taking? Authorizing Provider  diclofenac Sodium (VOLTAREN) 1 % GEL Apply 1 application topically 2 (two) times daily as needed (ankle pain.).   Yes [provider]  ibuprofen (ADVIL) 200 MG tablet Take 800 mg by mouth every 8 (eight) hours as needed for headache.   Yes [provider]  ondansetron (ZOFRAN ODT) 4 MG disintegrating tablet Take 1 tablet (4 mg total) by mouth every 8 (eight) hours as needed. Patient not taking: Reported on 05/30/2021 10/02/20   Yolonda Kida, MD  oxyCODONE (ROXICODONE) 5 MG immediate release tablet Take 1 tablet (5 mg total) by mouth every 8 (eight) hours as needed. Patient not taking: Reported on 05/30/2021 10/02/20 10/02/21  Yolonda Kida, MD     Positive ROS: All other systems have been reviewed and were otherwise negative with the exception of those mentioned in the HPI and as above.  Physical Exam:  Vitals:   06/25/21 0624  BP: 134/64  Pulse: 71  Resp: 17  Temp: 97.9 F (36.6 C)  SpO2: 97%   General: Alert, no acute distress Cardiovascular: No pedal edema Respiratory: No cyanosis, no use of accessory musculature GI: No organomegaly, abdomen is soft and non-tender  Skin: No lesions in the area of chief complaint Neurologic: Sensation intact distally Psychiatric: Patient is competent for consent with normal mood and affect Lymphatic: No axillary or cervical lymphadenopathy  MUSCULOSKELETAL: Left ankle with valgus tilt.  Swelling present.  Tender to palpation along the anterior ankle.  Pain with range of motion of the ankle.  Instability noted.  Well-healed surgical incisions.  Sensation grossly intact distally.  Foot is warm and well-perfused.  Assessment: Talus and distal tibia AVN with collapse and valgus tilt of the ankle.  Subfibular  impingement.   Plan: Plan for ankle arthrodesis with possible hardware removal.  Patient has AVN and collapse of the lateral aspect of his ankle joint and may require some structural allograft.  We discussed the risks, benefits and alternatives of surgery which include but are not limited to wound healing complications, infection, nonunion, malunion, need for further surgery, damage to surrounding structures and continued pain.  They understand there is no guarantees to an acceptable outcome.  After weighing these risks they opted to proceed with surgery.     Terance Hart, MD    06/25/2021 8:24 AM

## 2021-06-25 NOTE — Anesthesia Procedure Notes (Addendum)
Anesthesia Regional Block: Adductor canal block   Pre-Anesthetic Checklist: , timeout performed,  Correct Patient, Correct Site, Correct Laterality,  Correct Procedure, Correct Position, site marked,  Risks and benefits discussed,  Surgical consent,  Pre-op evaluation,  At surgeon's request and post-op pain management  Laterality: Left and Lower  Prep: chloraprep       Needles:  Injection technique: Single-shot      Needle Length: 9cm  Needle Gauge: 22     Additional Needles: Arrow StimuQuik ECHO Echogenic Stimulating PNB Needle  Procedures:,,,, ultrasound used (permanent image in chart),,    Narrative:  Start time: 06/25/2021 8:43 AM End time: 06/25/2021 8:46 AM Injection made incrementally with aspirations every 5 mL.  Performed by: Personally  Anesthesiologist: Val Eagle, MD

## 2021-06-26 ENCOUNTER — Encounter (HOSPITAL_COMMUNITY): Payer: Self-pay | Admitting: Orthopaedic Surgery

## 2021-06-26 NOTE — Anesthesia Postprocedure Evaluation (Signed)
Anesthesia Post Note  Patient: [name]  Procedure(s) Performed: LEFT ANKLE ARTHRODESIS, (Left: Ankle)     Patient location during evaluation: PACU Anesthesia Type: Regional and General Level of consciousness: awake and alert Pain management: pain level controlled Vital Signs Assessment: post-procedure vital signs reviewed and stable Respiratory status: spontaneous breathing, nonlabored ventilation, respiratory function stable and patient connected to nasal cannula oxygen Cardiovascular status: blood pressure returned to baseline and stable Postop Assessment: no apparent nausea or vomiting Anesthetic complications: no   No notable events documented.  Last Vitals:  Vitals:   06/25/21 1135 06/25/21 1150  BP: 138/84   Pulse: 81   Resp: 16   Temp:  (!) 36.4 C  SpO2: 95%     Last Pain:  Vitals:   06/25/21 1150  TempSrc:   PainSc: 4                  Shone Leventhal

## 2021-06-30 LAB — AEROBIC/ANAEROBIC CULTURE W GRAM STAIN (SURGICAL/DEEP WOUND): Culture: NO GROWTH

## 2021-08-22 ENCOUNTER — Other Ambulatory Visit: Payer: Self-pay | Admitting: Orthopaedic Surgery

## 2021-08-22 DIAGNOSIS — M79605 Pain in left leg: Secondary | ICD-10-CM

## 2021-08-22 DIAGNOSIS — M7989 Other specified soft tissue disorders: Secondary | ICD-10-CM

## 2021-08-23 ENCOUNTER — Ambulatory Visit
Admission: RE | Admit: 2021-08-23 | Discharge: 2021-08-23 | Disposition: A | Discharge status: Home / Self Care | Payer: Worker's Compensation | Source: Ambulatory Visit | Attending: Orthopaedic Surgery | Admitting: Orthopaedic Surgery

## 2021-08-23 DIAGNOSIS — M79605 Pain in left leg: Secondary | ICD-10-CM

## 2021-08-23 DIAGNOSIS — M7989 Other specified soft tissue disorders: Secondary | ICD-10-CM

## 2022-02-20 IMAGING — CT CT ANKLE*L* W/O CM
2 series · 13 of 27 positions shown, 16 images · non-contrast
Comparison: Left ankle radiograph 10/02/2020 and CT left ankle
10/02/2020

CLINICAL DATA: Left ankle pain

EXAM:
CT OF THE LEFT ANKLE WITHOUT CONTRAST
TECHNIQUE: Multidetector CT imaging of the left ankle was performed according
to the standard protocol. Multiplanar CT image reconstructions were
also generated.

[Series 4: soft tissue lower extremity · axial · 0.36mm/px · z∈[-245,-31]mm · 8 of 127 slices shown, 10 images]
[im 10/127  soft-tissue]
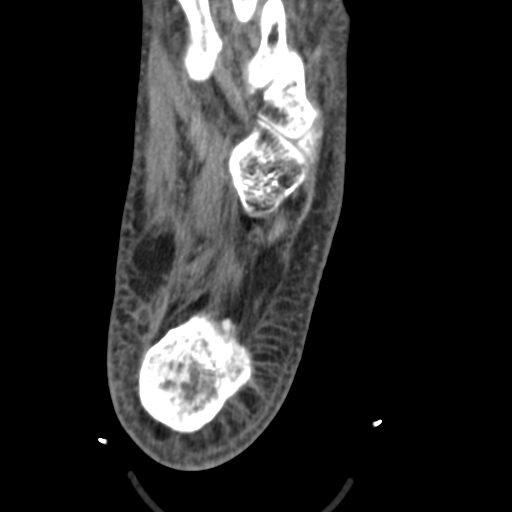
[im 10/127  bone]
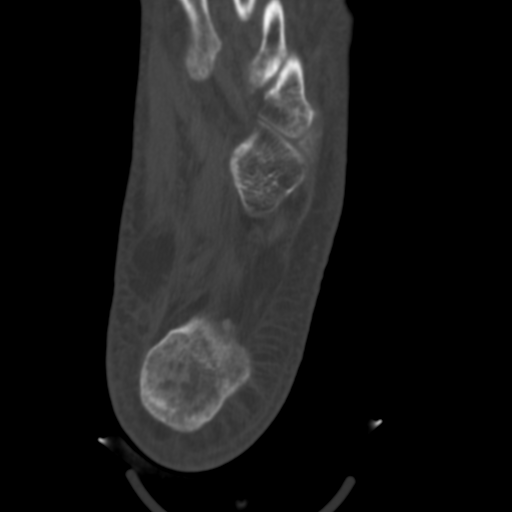
[im 30/127  bone]
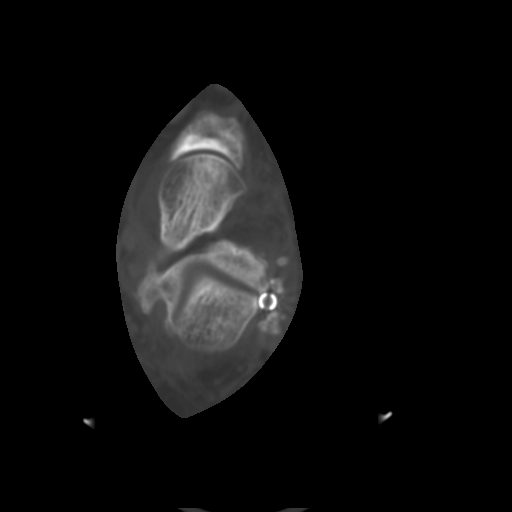
[im 39/127  bone]
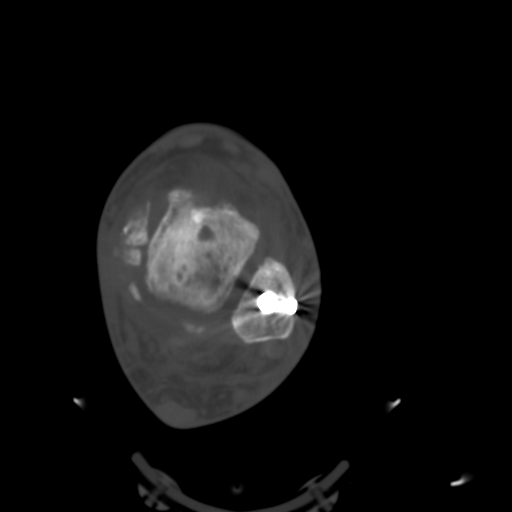
[im 59/127  bone]
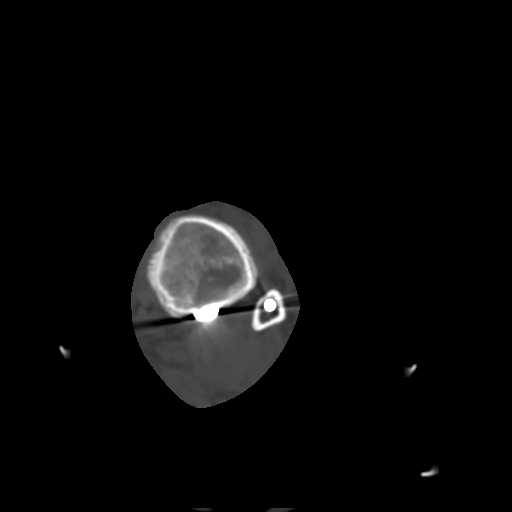
[im 68/127  soft-tissue]
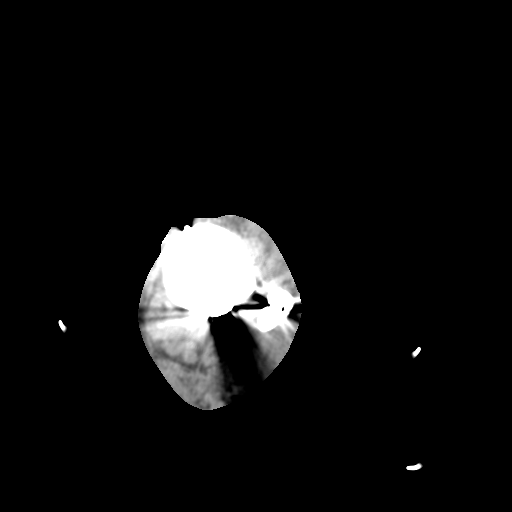
[im 68/127  bone]
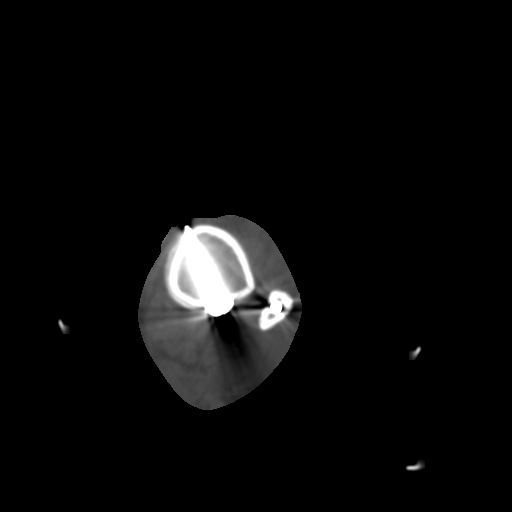
[im 88/127  bone]
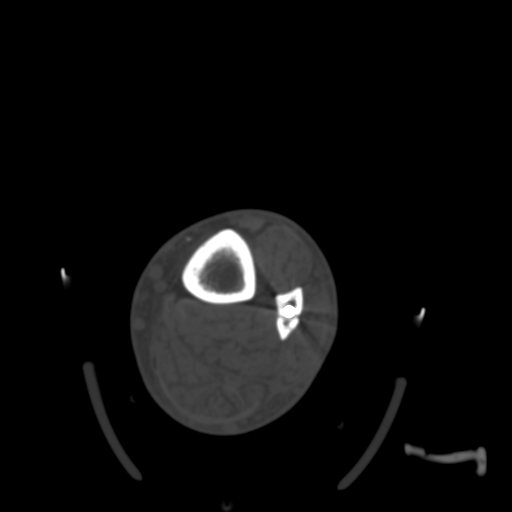
[im 97/127  bone]
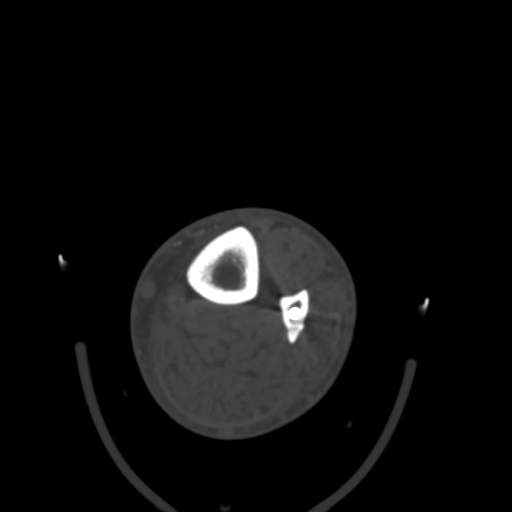
[im 117/127  bone]
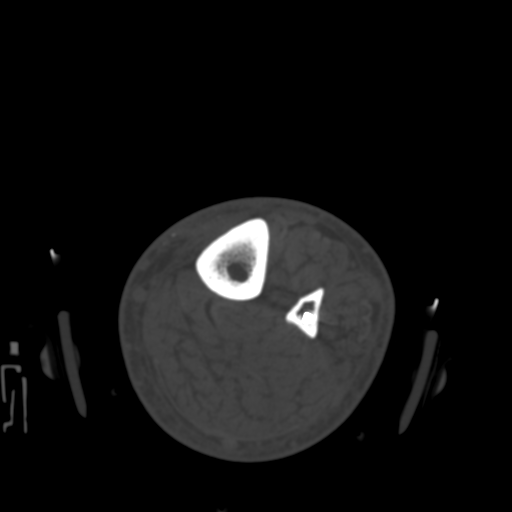

[Series 10: sagsoft tissue (person_name) · sagittal · 0.38mm/px · 5 of 76 slices shown, 6 images]
[im 26/76  bone]
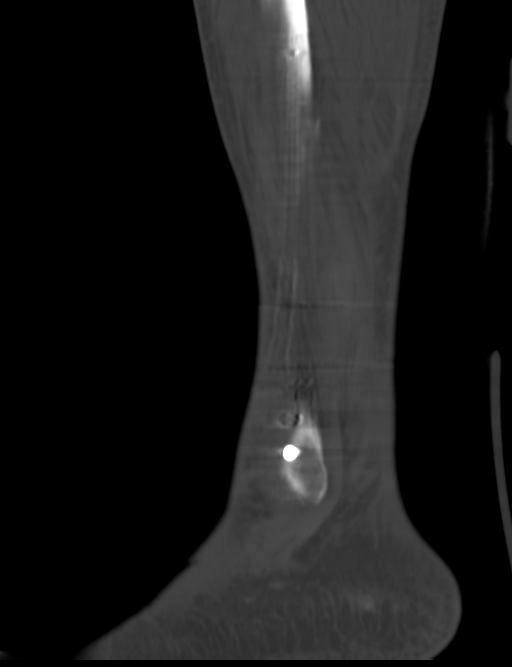
[im 32/76  bone]
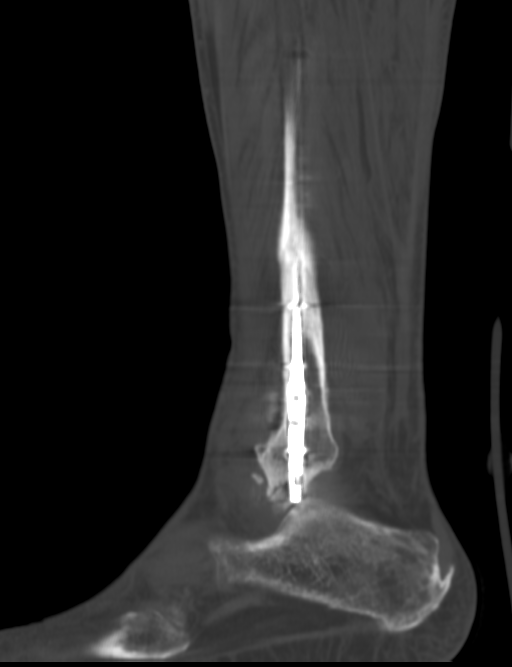
[im 38/76  soft-tissue]
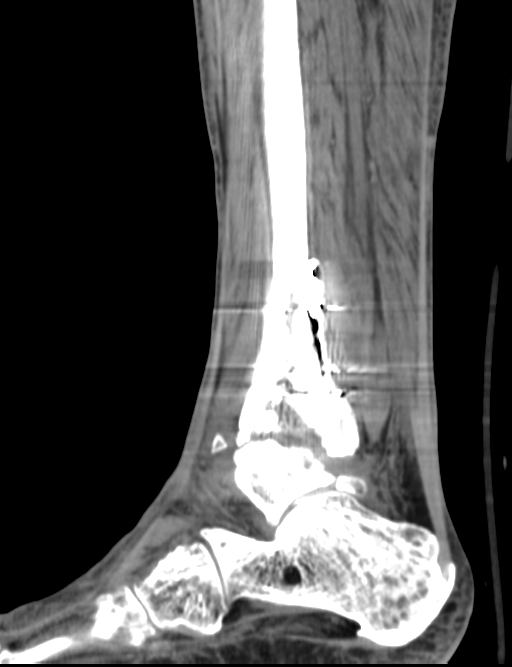
[im 38/76  bone]
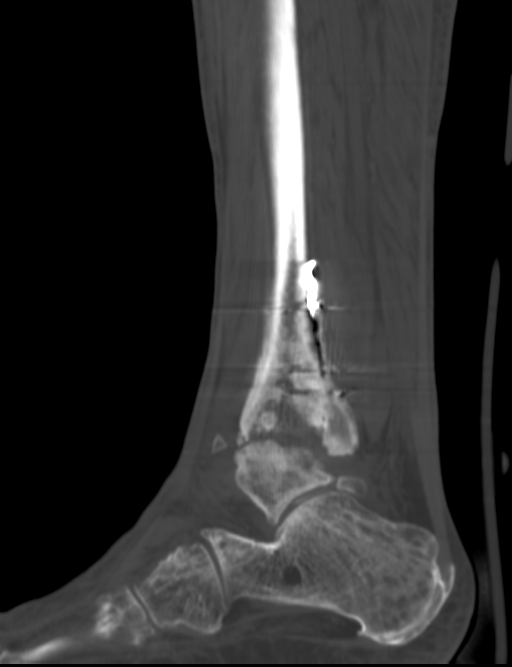
[im 44/76  bone]
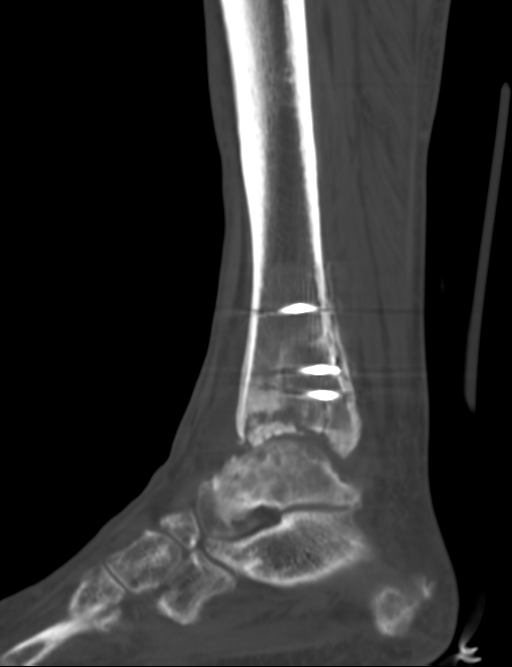
[im 51/76  bone]
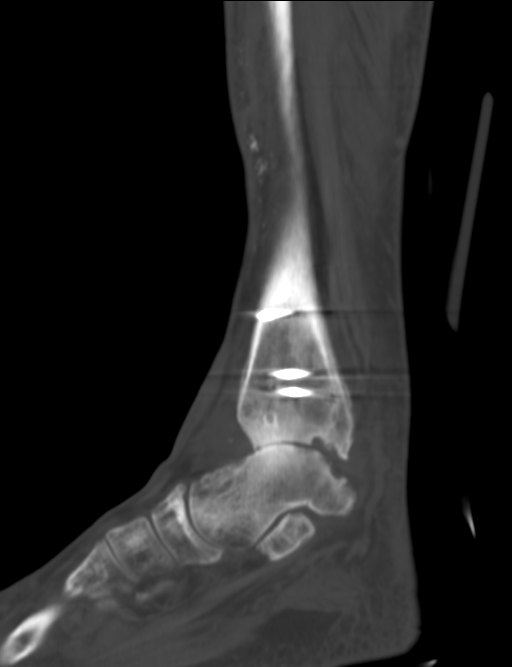

[13 of 27 positions shown; findings below may reference images not displayed]

FINDINGS: Bones/Joint/Cartilage

There is plate fixation of the posterior malleolar fracture with
partial/incomplete bony healing but the fracture segments are
well-approximated. Within the distal tibia, there is permeative
lucency/bony destruction, through which to plate fixation screws
partially traverse. This area measures approximately 2.9 x 2.6 x
cm and connects with the tibiotalar joint. (Coronal series 7, image
52).

There is a new osteochondral fracture of the anterior tibial plafond
with likely unstable fragment measuring 2.4 cm in width and 2.3 cm
anterior posterior (coronal series 7, image 48, sagittal series 9,
image 35).

There is hardware present suggesting prior syndesmotic fixation as
well. The tibiofibular syndesmosis appears slightly widened.

Severe posttraumatic tibiotalar arthritis. Marked subchondral
sclerosis and cystic change in the talar dome with osteochondral
defect of the lateral talar dome. Small area of serpiginous
sclerosis along the talar dome anteriorly which may be a small focus
of avascular necrosis/bone infarct. (Coronal series 7, image 44,
axial image 90).

There is some bony bridging across the comminuted fibular diaphyseal
fracture, but the fracture lines are still visible, likely
partial/incomplete bony healing. There is an intramedullary rod
within the fibula which appears intact without evidence of
complication.

Large medial malleolar fracture fragment remains displaced with some
sclerotic margins.

Disuse osteopenia. Large os trigonum. Dorsal and plantar calcaneal
spurring. There is mild talonavicular and calcaneocuboid
degenerative arthritis.

Ligaments

Suboptimally assessed by CT.

Muscles and Tendons

No significant muscle atrophy. No acute tendon abnormality on
noncontrast CT.

Soft tissues

Generalized soft tissue swelling of the ankle.
IMPRESSION: Plate fixation of the posterior malleolar fracture and
intramedullary nail fixation for the comminuted tibial diaphyseal
fracture, with syndesmotic fixation. Fracture segments are
well-approximated with partial/incomplete bony bridging. Intact
hardware.

Two screws partially traverse through a region of bony destruction
within the distal tibia, which is new from prior exam measuring
x 2.6 x 3.1 cm, and which connects with the articular surface of the
tibial plafond. This has the appearance of osteonecrosis. Adjacent
osteochondral fracture/defect of the anterior tibial plafond with
likely unstable fragment measuring 2.4 x 2.3 cm.

Severe posttraumatic tibiotalar arthritis, with possible small area
of AVN/bony infarct in the anterior talar dome. Small osteochondral
defect of the lateral talar dome.

Large medial malleolar fracture fragment remains displaced with some
sclerotic margins.

Ankle soft tissue swelling. Mild talonavicular and calcaneocuboid
degenerative arthritis.

## 2022-04-01 IMAGING — RF DG ANKLE COMPLETE 3+V*L*
1 series · 2 of 2 positions shown · non-contrast
Comparison: CT ankle 05/16/2021

CLINICAL DATA: Left ankle arthrodesis

EXAM:
LEFT ANKLE COMPLETE - 3+ VIEW

[Series 1: run · 2 of 2 slices shown]
[im 1/2]
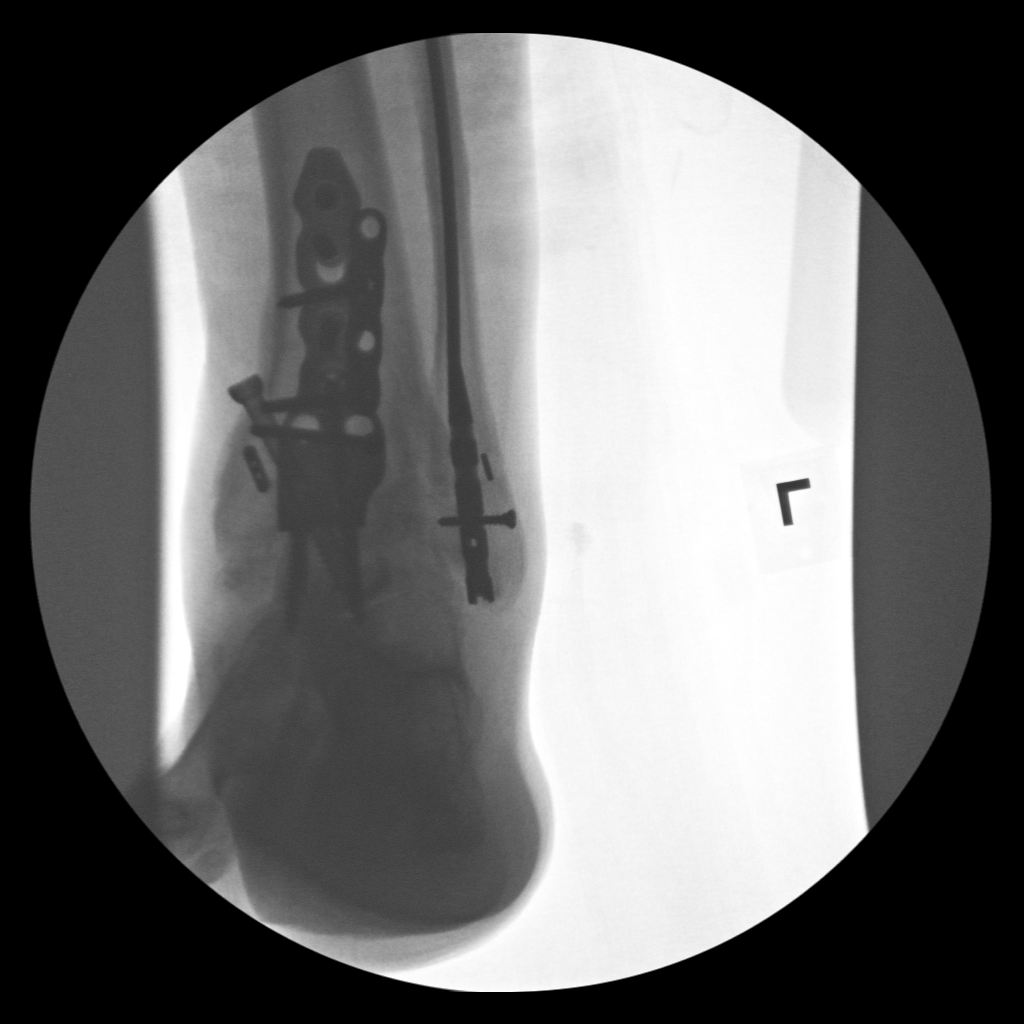
[im 2/2]
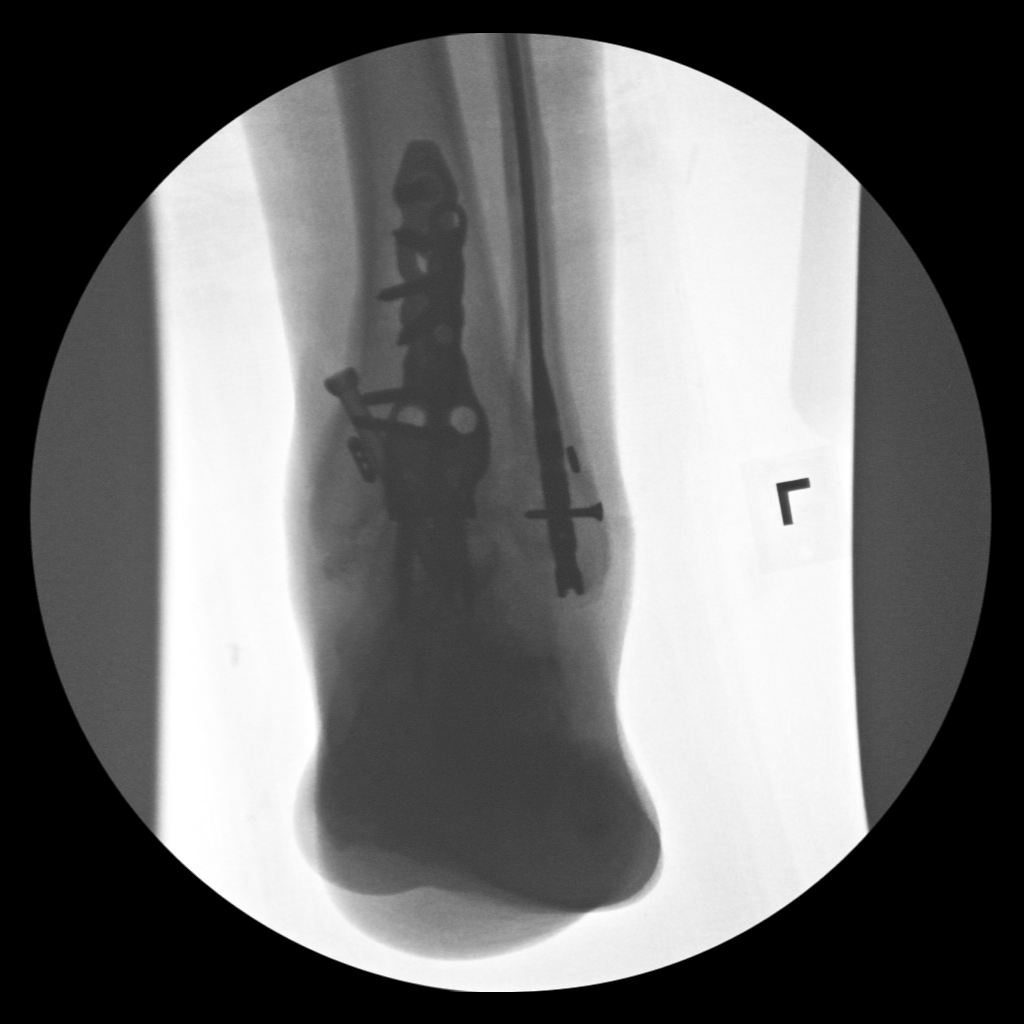

[2 of 2 positions shown; findings below may reference images not displayed]

FINDINGS: Frontal and shallow oblique projections of the left ankle
demonstrate a new likely anterior plate and screw fixator along the
distal tibia in addition to the previous posterior plate and screw
fixator, the new fixator has screws which may be traversing the
tibiotalar joint. There also appears to be an obliquely oriented
cannulated screw potentially traversing the joint in addition to the
pre-existing thin intramedullary rod in the distal fibula which does
not appear substantially changed. A button or ankle fixator is again
observed along the medial malleolus.

The third of the 3 images appears to be blank.
IMPRESSION: 1. New interval plate placement along the distal tibia potentially
with screw spanning the tibiotalar joint, as well as what appears to
be a new cannulated screw potentially traversing the tibiotalar
joint. The posterior tibial plate and screw fixator and fibular
hardware appear stable.

## 2022-09-29 ENCOUNTER — Encounter (HOSPITAL_COMMUNITY): Payer: Self-pay

## 2022-09-29 ENCOUNTER — Telehealth (HOSPITAL_COMMUNITY): Payer: Self-pay | Admitting: Internal Medicine

## 2022-09-29 ENCOUNTER — Ambulatory Visit (HOSPITAL_COMMUNITY): Payer: Worker's Compensation

## 2022-09-29 ENCOUNTER — Ambulatory Visit (HOSPITAL_BASED_OUTPATIENT_CLINIC_OR_DEPARTMENT_OTHER)
Admit: 2022-09-29 | Discharge: 2022-09-29 | Disposition: A | Discharge status: Home / Self Care | Payer: Worker's Compensation

## 2022-09-29 ENCOUNTER — Ambulatory Visit (HOSPITAL_COMMUNITY)
Admission: EM | Admit: 2022-09-29 | Discharge: 2022-09-29 | Disposition: A | Discharge status: Home / Self Care | Payer: Worker's Compensation | Attending: Internal Medicine | Admitting: Internal Medicine

## 2022-09-29 DIAGNOSIS — R52 Pain, unspecified: Secondary | ICD-10-CM | POA: Diagnosis not present

## 2022-09-29 DIAGNOSIS — L03116 Cellulitis of left lower limb: Secondary | ICD-10-CM

## 2022-09-29 DIAGNOSIS — R058 Other specified cough: Secondary | ICD-10-CM | POA: Insufficient documentation

## 2022-09-29 DIAGNOSIS — J069 Acute upper respiratory infection, unspecified: Secondary | ICD-10-CM | POA: Diagnosis present

## 2022-09-29 DIAGNOSIS — Z1152 Encounter for screening for COVID-19: Secondary | ICD-10-CM | POA: Diagnosis not present

## 2022-09-29 DIAGNOSIS — L538 Other specified erythematous conditions: Secondary | ICD-10-CM | POA: Diagnosis not present

## 2022-09-29 DIAGNOSIS — M7989 Other specified soft tissue disorders: Secondary | ICD-10-CM | POA: Diagnosis not present

## 2022-09-29 DIAGNOSIS — M25472 Effusion, left ankle: Secondary | ICD-10-CM | POA: Diagnosis not present

## 2022-09-29 DIAGNOSIS — R600 Localized edema: Secondary | ICD-10-CM | POA: Insufficient documentation

## 2022-09-29 HISTORY — DX: Cellulitis, unspecified: L03.90

## 2022-09-29 MED ORDER — BENZONATATE 100 MG PO CAPS: 100.0000 mg | ORAL_CAPSULE | Freq: Three times a day (TID) | ORAL | 0 refills | Status: DC

## 2022-09-29 MED ORDER — CEPHALEXIN 500 MG PO CAPS: 500.0000 mg | ORAL_CAPSULE | Freq: Four times a day (QID) | ORAL | 0 refills | Status: AC

## 2022-09-29 NOTE — Discharge Instructions (Addendum)
Please go directly to the radiology center at [facility] to have the scan of your left lower leg performed to rule out blood clot.  We will call you with the results of your blood clot study.   I have tested you for COVID today.  These results will come back in the next 12 to 24 hours.  Take tessalon perles every 8 hours as needed for cough. I have sent this medicine to the pharmacy for you to pick up.   I will call you if your blood work is abnormal.  If you develop any new or worsening symptoms or do not improve in the next 2 to 3 days, please return.  If your symptoms are severe, please go to the emergency room.  Follow-up with your primary care provider for further evaluation and management of your symptoms as well as ongoing wellness visits.  I hope you feel better!

## 2022-09-29 NOTE — ED Triage Notes (Addendum)
Patient reports that he has swelling and redness of the left foot and ankle that radiates to the upper thigh x 3 days. Patient has increased pain with weight bearing.

## 2022-09-29 NOTE — ED Provider Notes (Signed)
[facility]    CSN: [id number] Arrival date & time: 09/29/22  1009      History   Chief Complaint Chief Complaint  Patient presents with   Ankle Pain    HPI [name] is a 47 y.o. male.   Patient presents urgent care for evaluation of left lower extremity edema, pain, and redness that started on Friday September 26, 2021 (4 days ago).  History of close displaced trimalleolar fracture of the left ankle that was surgically repaired in October 2022.  Recently had follow-up appointment with his orthopedic surgeon last week with good report.  States his left ankle/calf is usually swollen at baseline and he is unable to feel three fourths of his left foot at baseline.  3 days ago, his left ankle and left calf became significantly more red, swollen, and he observed skin changes with extreme pain with ambulating and to palpation of the left calf/left ankle.  He denies recent injury/trauma to the area of greatest tenderness.  Denies recent long car rides, long periods of sitting, chest pain, shortness of breath, and heart palpitations.  Of note, patient did develop a fever/chills 4 days ago when the left lower extremity symptoms began and describes feeling of sweating after taking Advil for chills and generalized headache.  He is no longer experiencing headache but continues to experience dry cough.  Denies vision changes, sore throat, ear pain, abdominal pain, nausea, vomiting, known sick contacts, and history of chronic respiratory problems.  He has not had any Advil since last night and is currently afebrile.  Advil has not helped at all with his left leg pain.  Denies history of gout, diabetes, and kidney problems.  Denies history of DVT or PE.  He is not on any anticoagulant therapy at this time.  Denies recent surgical procedures, last surgical procedure was in 2022.  He has limited range of motion of the left lower extremity at the ankle joint at baseline due to history of  surgery and is unable to dorsiflex or plantarflex at the left ankle joint.  Denies weeping or drainage from the left lower extremity.  Patient was wearing compression socks until yesterday when he was unable to get his compression stocking off of the left lower extremity due to significant swelling and ended up having to cut the compression sock off of his leg due to swelling.     Past Medical History:  Diagnosis Date   Cellulitis    History of kidney stones    Hypertension    per pt, resolved after weight loss    Patient Active Problem List   Diagnosis Date Noted   Closed displaced trimalleolar fracture of left ankle 10/02/2020    Past Surgical History:  Procedure Laterality Date   ANKLE ARTHROSCOPY WITH ARTHRODESIS Left 06/25/2021   Procedure: LEFT ANKLE ARTHRODESIS,;  Surgeon: Terance Hart, MD;  Location: [facility] OR;  Service: Orthopedics;  Laterality: Left;   INGUINAL HERNIA REPAIR Right 2008   KNEE ARTHROSCOPY Right 1994   ORIF ANKLE FRACTURE Left 10/02/2020   Procedure: OPEN REDUCTION INTERNAL FIXATION (ORIF) ANKLE FRACTURE;  Surgeon: Yolonda Kida, MD;  Location: [facility] OR;  Service: Orthopedics;  Laterality: Left;   VASECTOMY         Home Medications    Prior to Admission medications   Medication Sig Start Date End Date Taking? Authorizing Provider  benzonatate (TESSALON) 100 MG capsule Take 1 capsule (100 mg total) by mouth every 8 (eight)  hours. 09/29/22  Yes Talbot Grumbling, FNP  diclofenac Sodium (VOLTAREN) 1 % GEL Apply 1 application topically 2 (two) times daily as needed (ankle pain.).    [provider]  ibuprofen (ADVIL) 200 MG tablet Take 800 mg by mouth every 8 (eight) hours as needed for headache.    [provider]    Family History Family History  Problem Relation Age of Onset   Heart murmur Father     Social History Social History   Tobacco Use   Smoking status: Every Day    Packs/day: 1.00    Types: Cigarettes    Smokeless tobacco: Never  Vaping Use   Vaping Use: Some days   Substances: Nicotine  Substance Use Topics   Alcohol use: Yes    Comment: "lightly" occasional   Drug use: Never     Allergies   Tomato   Review of Systems Review of Systems Per HPI  Physical Exam Triage Vital Signs ED Triage Vitals  Enc Vitals Group     BP 09/29/22 1124 132/89     Pulse Rate 09/29/22 1124 (!) 103     Resp 09/29/22 1124 18     Temp 09/29/22 1124 98.7 F (37.1 C)     Temp Source 09/29/22 1124 Oral     SpO2 09/29/22 1124 98 %     Weight --      Height --      Head Circumference --      Peak Flow --      Pain Score 09/29/22 1123 8     Pain Loc --      Pain Edu? --      Excl. in [place]? --    No data found.  Updated Vital Signs BP 132/89 (BP Location: Left Arm)   Pulse (!) 103   Temp 98.7 F (37.1 C) (Oral)   Resp 18   SpO2 98%   Visual Acuity Right Eye Distance:   Left Eye Distance:   Bilateral Distance:    Right Eye Near:   Left Eye Near:    Bilateral Near:     Physical Exam Vitals and nursing note reviewed.  Constitutional:      Appearance: He is not ill-appearing or toxic-appearing.  HENT:     Head: Normocephalic and atraumatic.     Right Ear: Hearing, tympanic membrane, ear canal and external ear normal.     Left Ear: Hearing, tympanic membrane, ear canal and external ear normal.     Nose: Congestion present.     Mouth/Throat:     Lips: Pink.     Mouth: Mucous membranes are moist.     Pharynx: No posterior oropharyngeal erythema.  Eyes:     General: Lids are normal. Vision grossly intact. Gaze aligned appropriately.     Extraocular Movements: Extraocular movements intact.     Conjunctiva/sclera: Conjunctivae normal.  Cardiovascular:     Rate and Rhythm: Regular rhythm. Tachycardia present.     Pulses:          Dorsalis pedis pulses are 1+ on the right side and 1+ on the left side.     Heart sounds: Normal heart sounds, S1 normal and S2 normal.  Pulmonary:      Effort: Pulmonary effort is normal. No respiratory distress.     Breath sounds: Normal breath sounds and air entry.  Musculoskeletal:     Cervical back: Neck supple.     Right lower leg: No edema.  Left lower leg: 1+ Edema present.  Lymphadenopathy:     Cervical: No cervical adenopathy.  Skin:    General: Skin is warm and dry.     Capillary Refill: Capillary refill takes less than 2 seconds.     Findings: Rash present.     Comments: Significant swelling, erythema, and warmth to the left lower extremity at the left calf/left ankle as seen in image below.  He is neurovascularly intact distal to swelling and redness.  Left calf is very tender to palpation.  Unable to complete Bevelyn Buckles' sign due to lack of range of motion at the left ankle joint at baseline.  Less than 3 capillary refill to the left lower extremity.  Neurological:     General: No focal deficit present.     Mental Status: He is alert and oriented to person, place, and time. Mental status is at baseline.     Cranial Nerves: No dysarthria or facial asymmetry.  Psychiatric:        Mood and Affect: Mood normal.        Speech: Speech normal.        Behavior: Behavior normal.        Thought Content: Thought content normal.        Judgment: Judgment normal.           UC Treatments / Results  Labs (all labs ordered are listed, but only abnormal results are displayed) Labs Reviewed  SARS CORONAVIRUS 2 (TAT 6-24 HRS)  CBC WITH DIFFERENTIAL/PLATELET  COMPREHENSIVE METABOLIC PANEL    EKG   Radiology No results found.  Procedures Procedures (including critical care time)  Medications Ordered in UC Medications - No data to display  Initial Impression / Assessment and Plan / UC Course  I have reviewed the triage vital signs and the nursing notes.  Pertinent labs & imaging results that were available during my care of the patient were reviewed by me and considered in my medical decision making (see chart for  details).   1.  Edema of left lower leg Unclear etiology of patient's left lower extremity pitting edema.  High suspicion for acute DVT, lower extremity venous ultrasound ordered. Patient to go to [facility] at 4pm today to have this done. Discussed risks of deferring this imaging. He is not currently experiencing any chest pain, shortness of breath, or heart palpitations. Deferred imaging of the chest based on stable cardiopulmonary exam and hemodynamically stable vital signs.  COVID-19 testing is pending.  If COVID-positive, this may be contributing to possible clotting process to the left lower extremity.  If COVID-19 testing is negative, we will likely manage this with prescription of Lasix to reduce swelling to the left lower extremity and antibiotic to manage this as an acute cellulitis.  Low suspicion for septic joint to the left ankle as swelling, erythema, and warmth are diffuse and he is currently afebrile without any antipyretic in his system.  Postop shoe placed to left foot as patient is unable to place shoe and sock on his foot due to diffuse swelling.  Advised patient to avoid taking Advil until we are able to get the result of his DVT study in case he needs to be started on Eliquis blood thinner.  He is agreeable with this plan.  Strict ER return precautions discussed.  Discussed physical exam and available lab work findings in clinic with patient.  Counseled patient regarding appropriate use of medications and potential side effects for all medications recommended or  prescribed today. Discussed red flag signs and symptoms of worsening condition,when to call the PCP office, return to urgent care, and when to seek higher level of care in the emergency department. Patient verbalizes understanding and agreement with plan. All questions answered. Patient discharged in stable condition.    Final Clinical Impressions(s) / UC Diagnoses   Final diagnoses:  Edema of left lower leg   Viral URI with cough     Discharge Instructions      Please go directly to the radiology center at [facility] to have the scan of your left lower leg performed to rule out blood clot.  We will call you with the results of your blood clot study.   I have tested you for COVID today.  These results will come back in the next 12 to 24 hours.  Take tessalon perles every 8 hours as needed for cough. I have sent this medicine to the pharmacy for you to pick up.   I will call you if your blood work is abnormal.  If you develop any new or worsening symptoms or do not improve in the next 2 to 3 days, please return.  If your symptoms are severe, please go to the emergency room.  Follow-up with your primary care provider for further evaluation and management of your symptoms as well as ongoing wellness visits.  I hope you feel better!    ED Prescriptions     Medication Sig Dispense Auth. Provider   benzonatate (TESSALON) 100 MG capsule Take 1 capsule (100 mg total) by mouth every 8 (eight) hours. 21 capsule Talbot Grumbling, FNP      PDMP not reviewed this encounter.   Talbot Grumbling, [place] 09/29/22 1239

## 2022-09-29 NOTE — ED Notes (Signed)
Ultrasound study scheduled for 4 pm this afternoon, patient aware and verbalizes understanding of plan.

## 2022-09-29 NOTE — Progress Notes (Signed)
Lower extremity venous duplex has been completed.   Preliminary results in CV Proc.   [name] 09/29/2022 4:28 PM

## 2022-09-30 ENCOUNTER — Encounter (HOSPITAL_COMMUNITY): Payer: Self-pay | Admitting: Internal Medicine

## 2022-09-30 LAB — SARS CORONAVIRUS 2 (TAT 6-24 HRS): SARS Coronavirus 2: NEGATIVE

## 2022-09-30 NOTE — Telephone Encounter (Signed)
Lower extremity venous ultrasound is negative for DVT, however they do note a large lymph node to the left groin.

## 2022-10-06 ENCOUNTER — Emergency Department (HOSPITAL_COMMUNITY)
Admission: EM | Admit: 2022-10-06 | Discharge: 2022-10-07 | Disposition: A | Discharge status: Home / Self Care | Payer: Worker's Compensation | Attending: Emergency Medicine | Admitting: Emergency Medicine

## 2022-10-06 DIAGNOSIS — D72829 Elevated white blood cell count, unspecified: Secondary | ICD-10-CM | POA: Insufficient documentation

## 2022-10-06 DIAGNOSIS — I10 Essential (primary) hypertension: Secondary | ICD-10-CM | POA: Insufficient documentation

## 2022-10-06 DIAGNOSIS — E871 Hypo-osmolality and hyponatremia: Secondary | ICD-10-CM | POA: Insufficient documentation

## 2022-10-06 DIAGNOSIS — R770 Abnormality of albumin: Secondary | ICD-10-CM | POA: Insufficient documentation

## 2022-10-06 DIAGNOSIS — R609 Edema, unspecified: Secondary | ICD-10-CM

## 2022-10-06 DIAGNOSIS — L03116 Cellulitis of left lower limb: Secondary | ICD-10-CM | POA: Diagnosis present

## 2022-10-06 DIAGNOSIS — R030 Elevated blood-pressure reading, without diagnosis of hypertension: Secondary | ICD-10-CM

## 2022-10-06 DIAGNOSIS — R7309 Other abnormal glucose: Secondary | ICD-10-CM | POA: Diagnosis not present

## 2022-10-06 DIAGNOSIS — R600 Localized edema: Secondary | ICD-10-CM | POA: Insufficient documentation

## 2022-10-06 LAB — CBC WITH DIFFERENTIAL/PLATELET
Abs Immature Granulocytes: 0.2 10*3/uL — ABNORMAL HIGH (ref 0.00–0.07)
Basophils Absolute: 0.1 10*3/uL (ref 0.0–0.1)
Basophils Relative: 1 %
Eosinophils Absolute: 0.2 10*3/uL (ref 0.0–0.5)
Eosinophils Relative: 2 %
HCT: 42.4 % (ref 39.0–52.0)
Hemoglobin: 14.7 g/dL (ref 13.0–17.0)
Immature Granulocytes: 2 %
Lymphocytes Relative: 21 %
Lymphs Abs: 2.3 10*3/uL (ref 0.7–4.0)
MCH: 30.6 pg (ref 26.0–34.0)
MCHC: 34.7 g/dL (ref 30.0–36.0)
MCV: 88.3 fL (ref 80.0–100.0)
Monocytes Absolute: 0.6 10*3/uL (ref 0.1–1.0)
Monocytes Relative: 5 %
Neutro Abs: 7.9 10*3/uL — ABNORMAL HIGH (ref 1.7–7.7)
Neutrophils Relative %: 69 %
Platelets: 361 10*3/uL (ref 150–400)
RBC: 4.8 MIL/uL (ref 4.22–5.81)
RDW: 12.4 % (ref 11.5–15.5)
WBC: 11.2 10*3/uL — ABNORMAL HIGH (ref 4.0–10.5)
nRBC: 0 % (ref 0.0–0.2)

## 2022-10-06 LAB — COMPREHENSIVE METABOLIC PANEL
ALT: 38 U/L (ref 0–44)
AST: 25 U/L (ref 15–41)
Albumin: 3.1 g/dL — ABNORMAL LOW (ref 3.5–5.0)
Alkaline Phosphatase: 75 U/L (ref 38–126)
Anion gap: 9 (ref 5–15)
BUN: 10 mg/dL (ref 6–20)
CO2: 24 mmol/L (ref 22–32)
Calcium: 8.5 mg/dL — ABNORMAL LOW (ref 8.9–10.3)
Chloride: 101 mmol/L (ref 98–111)
Creatinine, Ser: 0.88 mg/dL (ref 0.61–1.24)
GFR, Estimated: >60 mL/min (ref >60)
Glucose, Bld: 144 mg/dL — ABNORMAL HIGH (ref 70–99)
Potassium: 3.5 mmol/L (ref 3.5–5.1)
Sodium: 134 mmol/L — ABNORMAL LOW (ref 135–145)
Total Bilirubin: 0.5 mg/dL (ref 0.3–1.2)
Total Protein: 7.1 g/dL (ref 6.5–8.1)

## 2022-10-06 LAB — LACTIC ACID, PLASMA: Lactic Acid, Venous: 1.5 mmol/L (ref 0.5–1.9)

## 2022-10-06 NOTE — ED Notes (Signed)
Pt brought back to triage for repeat blood work.

## 2022-10-06 NOTE — ED Provider Triage Note (Signed)
Emergency Medicine Provider Triage Evaluation Note  [name] , Marshall 47 y.o. male  was evaluated in triage.  Pt complains of cellulitis of the left lower extremity.  Patient was seen at urgent care and had ultrasound of the left lower extremity that was negative for DVT.  Patient was started on Keflex with his last dose being today.  Patient notes that he feels that his redness and pain has slightly improved however not resolved.  Denies fever, chest pain, shortness of breath, drainage.  No anticoagulant use.  Review of Systems  Positive:  Negative:   Physical Exam  BP (!) 172/96   Pulse 83   Temp 98.3 F (36.8 C) (Oral)   Resp (!) 22   SpO2 99%  Gen:   Awake, no distress   Resp:  Normal effort  MSK:   Moves extremities without difficulty  Other:  Erythema, swelling, increased warmth noted to left lower extremity.  See picture below     Medical Decision Making  Medically screening exam initiated at 7:02 PM.  Appropriate orders placed.  [name] was informed that the remainder of the evaluation will be completed by another provider, this initial triage assessment does not replace that evaluation, and the importance of remaining in the ED until their evaluation is complete.  Workup initiated   Calvin Breit A, PA-C 10/06/22 1903

## 2022-10-06 NOTE — ED Triage Notes (Signed)
Patient here for evaluation of cellulitis in left lower leg that started several days ago. Patient seen at urgent care and had ultrasound of leg and was prescribed cephalexin. Patient states redness and pain has improved but is not resolved and he has completed his antibiotic. Patient is alert, oriented, and in no apparent distress at this time.

## 2022-10-07 MED ORDER — DOXYCYCLINE HYCLATE 100 MG PO TABS
100.0000 mg | ORAL_TABLET | Freq: Once | ORAL | Status: AC
  Administered 2022-10-07: 100 mg via ORAL
  Filled 2022-10-07: qty 1

## 2022-10-07 MED ORDER — DOXYCYCLINE HYCLATE 100 MG PO CAPS: 100.0000 mg | ORAL_CAPSULE | Freq: Two times a day (BID) | ORAL | 0 refills | Status: DC

## 2022-10-07 NOTE — ED Provider Notes (Addendum)
[facility] Provider Note   CSN: [id number] Arrival date & time: 10/06/22  1830     History  Chief Complaint  Patient presents with   Cellulitis    [name] is a 47 y.o. male.  The history is provided by the patient.  He has history of hypertension, kidney stones, cellulitis and comes in because of worsening of the redness in his left leg.  He was seen at an urgent care center on 09/29/2021 and had a negative ultrasound of the leg and was given a 1 week prescription for cephalexin.  He did note some improvement, but he took the last dose of that prescription, and has noted some worsening of redness since stopping the cephalexin.  He denies fever, chills, sweats.  Problems with his leg have surfaced since having surgery for an ankle fracture about 15 months ago.  Home Medications Prior to Admission medications   Medication Sig Start Date End Date Taking? Authorizing Provider  benzonatate (TESSALON) 100 MG capsule Take 1 capsule (100 mg total) by mouth every 8 (eight) hours. 09/29/22   Talbot Grumbling, FNP  diclofenac Sodium (VOLTAREN) 1 % GEL Apply 1 application topically 2 (two) times daily as needed (ankle pain.).    [provider]  ibuprofen (ADVIL) 200 MG tablet Take 800 mg by mouth every 8 (eight) hours as needed for headache.    [provider]      Allergies    Tomato    Review of Systems   Review of Systems  All other systems reviewed and are negative.   Physical Exam Updated Vital Signs BP (!) 143/97 (BP Location: Right Arm)   Pulse 69   Temp 98.3 F (36.8 C) (Oral)   Resp 18   SpO2 95%  Physical Exam Vitals and nursing note reviewed.   47 year old male, resting comfortably and in no acute distress. Vital signs are significant for elevated blood pressure. Oxygen saturation is 95%, which is normal. Head is normocephalic and atraumatic. PERRLA, EOMI.  Neck is nontender and supple  without adenopathy. Lungs are clear without rales, wheezes, or rhonchi. Chest is nontender. Heart has regular rate and rhythm without murmur. Abdomen is soft, flat, nontender. Extremities: There is 1+ pitting edema on the right, 2+ pitting edema on the left.  The left lower leg is erythematous with slight warmth. Skin is warm and dry without rash. Neurologic: Mental status is normal, cranial nerves are intact, moves all extremities equally.    ED Results / Procedures / Treatments   Labs (all labs ordered are listed, but only abnormal results are displayed) Labs Reviewed  COMPREHENSIVE METABOLIC PANEL - Abnormal; Notable for the following components:      Result Value   Sodium 134 (*)    Glucose, Bld 144 (*)    Calcium 8.5 (*)    Albumin 3.1 (*)    All other components within normal limits  CBC WITH DIFFERENTIAL/PLATELET - Abnormal; Notable for the following components:   WBC 11.2 (*)    Neutro Abs 7.9 (*)    Abs Immature Granulocytes 0.20 (*)    All other components within normal limits  CULTURE, BLOOD (ROUTINE X 2)  CULTURE, BLOOD (ROUTINE X 2)  LACTIC ACID, PLASMA  LACTIC ACID, PLASMA   Procedures Procedures    Medications Ordered in ED Medications  doxycycline (VIBRA-TABS) tablet 100 mg (100 mg Oral Given 10/07/22 0212)    ED Course/ Medical Decision Making/  A&P                             Medical Decision Making Amount and/or Complexity of Data Reviewed Labs: ordered.  Risk Prescription drug management.   Cellulitis of the left lower leg which apparently was partly treated and is worsening since completion of antibiotics.  I have reviewed and interpreted his laboratory test, and my interpretation is normal lactic acid, no evidence of sepsis.  Borderline hyponatremia which is not felt to be clinically significant, elevated random glucose level which will need to be followed as an outpatient, hypoalbuminemia which will need to be followed as an outpatient, mild  leukocytosis which is nonspecific-no left shift.  I suspect that he has some component of venous insufficiency/venous stasis contributing to his problems.  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  The injury and subsequent surgical management may be predisposing him to venous stasis.  At this point, he does not need inpatient admission.  I am discharging him with a prescription for a 2-week course of doxycycline.  I have encouraged him to obtain a primary care provider.  Final Clinical Impression(s) / ED Diagnoses Final diagnoses:  Cellulitis of left lower leg  Peripheral edema  Elevated blood pressure reading without diagnosis of hypertension  Hyponatremia  Elevated random blood glucose level    Rx / DC Orders ED Discharge Orders          Ordered    doxycycline (VIBRAMYCIN) 100 MG capsule  2 times daily        10/07/22 3300              Delora Fuel, MD 76/22/63 3354    Delora Fuel, MD 56/25/63 [phone]

## 2022-10-07 NOTE — Discharge Instructions (Addendum)
Please take the antibiotic as prescribed.  Keep your legs propped up is much as possible.  This will help reduce the swelling and improve the circulation.  Your blood pressure was slightly elevated today, please have it rechecked as an outpatient.  As long as it is coming down to normal, you do not need any specific treatment for it.  Your blood sugar is slightly high today.  This is a range that is sometimes called prediabetes.  This needs to be monitored by a primary care provider.  You need to obtain a primary care provider to monitor things such as your blood pressure, blood sugar as well as to make sure that you have appropriate screening tests done.

## 2022-10-11 LAB — CULTURE, BLOOD (ROUTINE X 2)
Culture: NO GROWTH
Special Requests: ADEQUATE

## 2022-10-12 LAB — CULTURE, BLOOD (ROUTINE X 2): Culture: NO GROWTH

## 2022-10-30 ENCOUNTER — Other Ambulatory Visit: Payer: Self-pay

## 2022-10-30 ENCOUNTER — Emergency Department (HOSPITAL_COMMUNITY): Payer: Worker's Compensation

## 2022-10-30 ENCOUNTER — Encounter (HOSPITAL_COMMUNITY): Payer: Self-pay | Admitting: Internal Medicine

## 2022-10-30 ENCOUNTER — Inpatient Hospital Stay (HOSPITAL_COMMUNITY)
Admission: EM | Admit: 2022-10-30 | Discharge: 2022-11-04 | DRG: 872 | Disposition: A | Discharge status: Home Health | Payer: Worker's Compensation | Attending: Internal Medicine | Admitting: Internal Medicine

## 2022-10-30 DIAGNOSIS — L02416 Cutaneous abscess of left lower limb: Secondary | ICD-10-CM | POA: Diagnosis present

## 2022-10-30 DIAGNOSIS — L97221 Non-pressure chronic ulcer of left calf limited to breakdown of skin: Secondary | ICD-10-CM | POA: Diagnosis present

## 2022-10-30 DIAGNOSIS — L03116 Cellulitis of left lower limb: Secondary | ICD-10-CM

## 2022-10-30 DIAGNOSIS — Z79899 Other long term (current) drug therapy: Secondary | ICD-10-CM | POA: Diagnosis not present

## 2022-10-30 DIAGNOSIS — Z6836 Body mass index (BMI) 36.0-36.9, adult: Secondary | ICD-10-CM

## 2022-10-30 DIAGNOSIS — F17210 Nicotine dependence, cigarettes, uncomplicated: Secondary | ICD-10-CM | POA: Diagnosis present

## 2022-10-30 DIAGNOSIS — Z9181 History of falling: Secondary | ICD-10-CM | POA: Diagnosis not present

## 2022-10-30 DIAGNOSIS — Z91018 Allergy to other foods: Secondary | ICD-10-CM | POA: Diagnosis not present

## 2022-10-30 DIAGNOSIS — Z981 Arthrodesis status: Secondary | ICD-10-CM

## 2022-10-30 DIAGNOSIS — A409 Streptococcal sepsis, unspecified: Principal | ICD-10-CM | POA: Diagnosis present

## 2022-10-30 DIAGNOSIS — T8459XA Infection and inflammatory reaction due to other internal joint prosthesis, initial encounter: Secondary | ICD-10-CM | POA: Diagnosis present

## 2022-10-30 DIAGNOSIS — B955 Unspecified streptococcus as the cause of diseases classified elsewhere: Secondary | ICD-10-CM | POA: Insufficient documentation

## 2022-10-30 DIAGNOSIS — L03119 Cellulitis of unspecified part of limb: Secondary | ICD-10-CM | POA: Diagnosis not present

## 2022-10-30 DIAGNOSIS — L02419 Cutaneous abscess of limb, unspecified: Secondary | ICD-10-CM | POA: Diagnosis present

## 2022-10-30 DIAGNOSIS — R7881 Bacteremia: Secondary | ICD-10-CM | POA: Diagnosis not present

## 2022-10-30 DIAGNOSIS — Y792 Prosthetic and other implants, materials and accessory orthopedic devices associated with adverse incidents: Secondary | ICD-10-CM | POA: Diagnosis present

## 2022-10-30 DIAGNOSIS — I10 Essential (primary) hypertension: Secondary | ICD-10-CM | POA: Diagnosis present

## 2022-10-30 DIAGNOSIS — F17200 Nicotine dependence, unspecified, uncomplicated: Secondary | ICD-10-CM | POA: Diagnosis present

## 2022-10-30 DIAGNOSIS — E669 Obesity, unspecified: Secondary | ICD-10-CM | POA: Diagnosis present

## 2022-10-30 DIAGNOSIS — L0390 Cellulitis, unspecified: Secondary | ICD-10-CM | POA: Diagnosis not present

## 2022-10-30 LAB — CBC WITH DIFFERENTIAL/PLATELET
Abs Immature Granulocytes: 0.09 10*3/uL — ABNORMAL HIGH (ref 0.00–0.07)
Basophils Absolute: 0 10*3/uL (ref 0.0–0.1)
Basophils Relative: 0 %
Eosinophils Absolute: 0.1 10*3/uL (ref 0.0–0.5)
Eosinophils Relative: 1 %
HCT: 46.6 % (ref 39.0–52.0)
Hemoglobin: 16.4 g/dL (ref 13.0–17.0)
Immature Granulocytes: 1 %
Lymphocytes Relative: 12 %
Lymphs Abs: 1.6 10*3/uL (ref 0.7–4.0)
MCH: 31.1 pg (ref 26.0–34.0)
MCHC: 35.2 g/dL (ref 30.0–36.0)
MCV: 88.4 fL (ref 80.0–100.0)
Monocytes Absolute: 0.9 10*3/uL (ref 0.1–1.0)
Monocytes Relative: 7 %
Neutro Abs: 10.4 10*3/uL — ABNORMAL HIGH (ref 1.7–7.7)
Neutrophils Relative %: 79 %
Platelets: 210 10*3/uL (ref 150–400)
RBC: 5.27 MIL/uL (ref 4.22–5.81)
RDW: 12.7 % (ref 11.5–15.5)
WBC: 13.2 10*3/uL — ABNORMAL HIGH (ref 4.0–10.5)
nRBC: 0 % (ref 0.0–0.2)

## 2022-10-30 LAB — COMPREHENSIVE METABOLIC PANEL
ALT: 25 U/L (ref 0–44)
AST: 22 U/L (ref 15–41)
Albumin: 3.7 g/dL (ref 3.5–5.0)
Alkaline Phosphatase: 75 U/L (ref 38–126)
Anion gap: 9 (ref 5–15)
BUN: 15 mg/dL (ref 6–20)
CO2: 24 mmol/L (ref 22–32)
Calcium: 9.1 mg/dL (ref 8.9–10.3)
Chloride: 104 mmol/L (ref 98–111)
Creatinine, Ser: 1.02 mg/dL (ref 0.61–1.24)
GFR, Estimated: >60 mL/min (ref >60)
Glucose, Bld: 130 mg/dL — ABNORMAL HIGH (ref 70–99)
Potassium: 3.9 mmol/L (ref 3.5–5.1)
Sodium: 137 mmol/L (ref 135–145)
Total Bilirubin: 0.7 mg/dL (ref 0.3–1.2)
Total Protein: 7.6 g/dL (ref 6.5–8.1)

## 2022-10-30 LAB — LACTIC ACID, PLASMA
Lactic Acid, Venous: 1.6 mmol/L (ref 0.5–1.9)
Lactic Acid, Venous: 1.2 mmol/L (ref 0.5–1.9)

## 2022-10-30 LAB — HEMOGLOBIN A1C
Hgb A1c MFr Bld: 5.5 % (ref 4.8–5.6)
Mean Plasma Glucose: 111.15 mg/dL

## 2022-10-30 LAB — HIV ANTIBODY (ROUTINE TESTING W REFLEX): HIV Screen 4th Generation wRfx: NONREACTIVE

## 2022-10-30 LAB — PREALBUMIN: Prealbumin: 20 mg/dL (ref 18–38)

## 2022-10-30 LAB — SEDIMENTATION RATE: Sed Rate: 23 mm/hr — ABNORMAL HIGH (ref 0–16)

## 2022-10-30 LAB — C-REACTIVE PROTEIN: CRP: 6.8 mg/dL — ABNORMAL HIGH (ref <1.0)

## 2022-10-30 MED ORDER — VANCOMYCIN HCL 1500 MG/300ML IV SOLN
1500.0000 mg | Freq: Two times a day (BID) | INTRAVENOUS | Status: DC
  Filled 2022-10-30: qty 300

## 2022-10-30 MED ORDER — VANCOMYCIN HCL IN DEXTROSE 1-5 GM/200ML-% IV SOLN: 1000.0000 mg | Freq: Once | INTRAVENOUS | Status: DC

## 2022-10-30 MED ORDER — SODIUM CHLORIDE 0.9 % IV SOLN: 2.0000 g | INTRAVENOUS | Status: DC

## 2022-10-30 MED ORDER — OXYCODONE HCL 5 MG PO TABS
5.0000 mg | ORAL_TABLET | ORAL | Status: DC | PRN
  Administered 2022-10-30 – 2022-10-31 (×4): 5 mg via ORAL
  Filled 2022-10-30 (×4): qty 1

## 2022-10-30 MED ORDER — SODIUM CHLORIDE 0.9 % IV SOLN: INTRAVENOUS | Status: DC

## 2022-10-30 MED ORDER — HEPARIN SODIUM (PORCINE) 5000 UNIT/ML IJ SOLN
5000.0000 [IU] | Freq: Three times a day (TID) | INTRAMUSCULAR | Status: DC
  Administered 2022-10-30 – 2022-11-04 (×16): 5000 [IU] via SUBCUTANEOUS
  Filled 2022-10-30 (×16): qty 1

## 2022-10-30 MED ORDER — POLYETHYLENE GLYCOL 3350 17 G PO PACK
17.0000 g | PACK | Freq: Every day | ORAL | Status: DC | PRN
  Administered 2022-10-31 – 2022-11-02 (×2): 17 g via ORAL
  Filled 2022-10-30 (×2): qty 1

## 2022-10-30 MED ORDER — DOCUSATE SODIUM 100 MG PO CAPS
100.0000 mg | ORAL_CAPSULE | Freq: Two times a day (BID) | ORAL | Status: DC
  Administered 2022-10-30 – 2022-11-04 (×9): 100 mg via ORAL
  Filled 2022-10-30 (×12): qty 1

## 2022-10-30 MED ORDER — ACETAMINOPHEN 325 MG PO TABS: 650.0000 mg | ORAL_TABLET | Freq: Four times a day (QID) | ORAL | Status: DC | PRN

## 2022-10-30 MED ORDER — VANCOMYCIN HCL 1500 MG/300ML IV SOLN
1500.0000 mg | Freq: Two times a day (BID) | INTRAVENOUS | Status: DC
  Administered 2022-10-31: 1500 mg via INTRAVENOUS
  Filled 2022-10-30: qty 300

## 2022-10-30 MED ORDER — HYDROMORPHONE HCL 1 MG/ML IJ SOLN
1.0000 mg | Freq: Once | INTRAMUSCULAR | Status: AC
  Administered 2022-10-30: 1 mg via INTRAVENOUS
  Filled 2022-10-30: qty 1

## 2022-10-30 MED ORDER — ACETAMINOPHEN 650 MG RE SUPP: 650.0000 mg | Freq: Four times a day (QID) | RECTAL | Status: DC | PRN

## 2022-10-30 MED ORDER — SODIUM CHLORIDE 0.9 % IV SOLN
2.0000 g | Freq: Once | INTRAVENOUS | Status: AC
  Administered 2022-10-30: 2 g via INTRAVENOUS
  Filled 2022-10-30: qty 12.5

## 2022-10-30 MED ORDER — ONDANSETRON HCL 4 MG PO TABS: 4.0000 mg | ORAL_TABLET | Freq: Four times a day (QID) | ORAL | Status: DC | PRN

## 2022-10-30 MED ORDER — NICOTINE 14 MG/24HR TD PT24
14.0000 mg | MEDICATED_PATCH | Freq: Every day | TRANSDERMAL | Status: DC
  Administered 2022-10-30 – 2022-11-04 (×6): 14 mg via TRANSDERMAL
  Filled 2022-10-30 (×6): qty 1

## 2022-10-30 MED ORDER — MORPHINE SULFATE (PF) 4 MG/ML IV SOLN
4.0000 mg | Freq: Once | INTRAVENOUS | Status: AC
  Administered 2022-10-30: 4 mg via INTRAVENOUS
  Filled 2022-10-30: qty 1

## 2022-10-30 MED ORDER — ONDANSETRON HCL 4 MG/2ML IJ SOLN: 4.0000 mg | Freq: Four times a day (QID) | INTRAMUSCULAR | Status: DC | PRN

## 2022-10-30 MED ORDER — HYDROMORPHONE HCL 1 MG/ML IJ SOLN
0.5000 mg | INTRAMUSCULAR | Status: DC | PRN
  Administered 2022-10-30 – 2022-11-04 (×26): 0.5 mg via INTRAVENOUS
  Filled 2022-10-30 (×26): qty 0.5

## 2022-10-30 MED ORDER — VANCOMYCIN HCL IN DEXTROSE 1-5 GM/200ML-% IV SOLN
1000.0000 mg | INTRAVENOUS | Status: AC
  Administered 2022-10-30: 1000 mg via INTRAVENOUS
  Filled 2022-10-30: qty 200

## 2022-10-30 MED ORDER — SODIUM CHLORIDE 0.9 % IV SOLN
2.0000 g | INTRAVENOUS | Status: DC
  Administered 2022-10-30 – 2022-11-02 (×4): 2 g via INTRAVENOUS
  Filled 2022-10-30 (×4): qty 20

## 2022-10-30 MED ORDER — MORPHINE SULFATE (PF) 2 MG/ML IV SOLN
2.0000 mg | INTRAVENOUS | Status: DC | PRN
  Administered 2022-10-30: 2 mg via INTRAVENOUS
  Filled 2022-10-30: qty 1

## 2022-10-30 MED ORDER — VANCOMYCIN HCL IN DEXTROSE 1-5 GM/200ML-% IV SOLN
1000.0000 mg | Freq: Once | INTRAVENOUS | Status: AC
  Administered 2022-10-30: 1000 mg via INTRAVENOUS
  Filled 2022-10-30: qty 200

## 2022-10-30 MED ORDER — HYDRALAZINE HCL 20 MG/ML IJ SOLN: 5.0000 mg | INTRAMUSCULAR | Status: DC | PRN

## 2022-10-30 MED ORDER — ONDANSETRON HCL 4 MG/2ML IJ SOLN
4.0000 mg | Freq: Once | INTRAMUSCULAR | Status: AC
  Administered 2022-10-30: 4 mg via INTRAVENOUS
  Filled 2022-10-30: qty 2

## 2022-10-30 MED ORDER — BISACODYL 5 MG PO TBEC
5.0000 mg | DELAYED_RELEASE_TABLET | Freq: Every day | ORAL | Status: DC | PRN
  Administered 2022-11-02 – 2022-11-03 (×2): 5 mg via ORAL
  Filled 2022-10-30 (×2): qty 1

## 2022-10-30 MED ORDER — METRONIDAZOLE 500 MG/100ML IV SOLN
500.0000 mg | Freq: Two times a day (BID) | INTRAVENOUS | Status: DC
  Administered 2022-10-30 – 2022-11-03 (×8): 500 mg via INTRAVENOUS
  Filled 2022-10-30 (×8): qty 100

## 2022-10-30 NOTE — ED Provider Notes (Signed)
[facility] Provider Note   CSN: [id number] Arrival date & time: 10/30/22  0847     History  Chief Complaint  Patient presents with   Ankle Pain   Wound Check   Wound Infection    [name] is a 47 y.o. male.  [name] is a 47 y.o. 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  Patient reports symptoms for started in mid January, he was seen at urgent care and placed on a week of Keflex and got some improvement but finished antibiotics and symptoms began to worsen again, never got complete resolution.  Due to persistent symptoms was seen in the ED on 1/23 diagnosed with cellulitis of the lower extremity and placed on a 2-week course of doxycycline.  Patient has been off antibiotics for almost a week now he reports little to no improvement while on doxycycline and last night after work he started to notice increasing redness swelling and pain to the left ankle with a small ulceration to the medial malleolus with some clear drainage.  He has had increasing pain in the ankle and foot since last night and redness has extended up to the mid shin.  He denies any known fevers.  His initial surgery for fracture was done by Dr. Stann Mainland and revision was completed by Dr. Lucia Gaskins in 2022.  The history is provided by the patient and medical records.  Ankle Pain Associated symptoms: no fever   Wound Check       Home Medications Prior to Admission medications   Medication Sig Start Date End Date Taking? Authorizing Provider  benzonatate (TESSALON) 100 MG capsule Take 1 capsule (100 mg total) by mouth every 8 (eight) hours. 09/29/22  Yes Talbot Grumbling, FNP  ibuprofen (ADVIL) 200 MG tablet Take 800 mg by mouth every 8 (eight) hours  as needed for headache, mild pain or moderate pain.   Yes [provider]  oxymetazoline (AFRIN) 0.05 % nasal spray Place 1-2 sprays into both nostrils at bedtime as needed for congestion.   Yes [provider]  urea (CARMOL) 40 % CREA Apply 1 application  topically daily.   Yes [provider]  doxycycline (VIBRAMYCIN) 100 MG capsule Take 1 capsule (100 mg total) by mouth 2 (two) times daily. One po bid x 7 days Patient not taking: Reported on [id number]   Delora Fuel, MD      Allergies    Tomato    Review of Systems   Review of Systems  Constitutional:  Negative for chills and fever.  Cardiovascular:  Positive for leg swelling.  Musculoskeletal:  Positive for arthralgias and myalgias.  Skin:  Positive for color change.  All other systems reviewed and are negative.   Physical Exam Updated Vital Signs BP (!) 140/70 (BP Location: Right Arm)   Pulse (!) 105   Temp 99 F (37.2 C)   Resp 20   Ht 6' 1"$  (1.854 m)   Wt 127 kg   SpO2 96%   BMI 36.94 kg/m  Physical Exam Vitals and nursing note reviewed.  Constitutional:      General: He is not in acute distress.    Appearance: Normal appearance. He is well-developed. He is not ill-appearing or diaphoretic.  HENT:     Head: Normocephalic  and atraumatic.  Eyes:     General:        Right eye: No discharge.        Left eye: No discharge.  Cardiovascular:     Rate and Rhythm: Normal rate and regular rhythm.     Pulses: Normal pulses.          Radial pulses are 2+ on the right side and 2+ on the left side.       Dorsalis pedis pulses are 2+ on the right side and 2+ on the left side.     Heart sounds: Normal heart sounds.  Pulmonary:     Effort: Pulmonary effort is normal. No respiratory distress.     Breath sounds: Normal breath sounds. No wheezing or rales.     Comments: Respirations equal and unlabored, patient able to speak in full sentences, lungs clear to auscultation bilaterally   Abdominal:     General: Bowel sounds are normal. There is no distension.     Palpations: Abdomen is soft. There is no mass.     Tenderness: There is no abdominal tenderness. There is no guarding.     Comments: Abdomen soft, nondistended, nontender to palpation in all quadrants without guarding or peritoneal signs  Musculoskeletal:     Cervical back: Neck supple.     Comments: Extremities with erythema extending from the midfoot up to the mid shin, swelling most notable surrounding the ankle joint, tenderness to palpation, small ulceration with serous fluid draining from the medial aspect of the ankle.  No purulent drainage or palpable areas of fluctuance.  The extremity is tender to palpation primarily over the areas of erythema, most notably over the ankle joint.  Patient with decreased sensation over the toes which he reports is chronic since his initial surgery.  Will cap refill in the toes.  (See photos below)  Skin:    General: Skin is warm and dry.     Capillary Refill: Capillary refill takes less than 2 seconds.  Neurological:     Mental Status: He is alert and oriented to person, place, and time.     Coordination: Coordination normal.     Comments: Speech is clear, able to follow commands Moves extremities without ataxia, coordination intact  Psychiatric:        Mood and Affect: Mood normal.        Behavior: Behavior normal.     ED Results / Procedures / Treatments   Labs (all labs ordered are listed, but only abnormal results are displayed) Labs Reviewed  CBC WITH DIFFERENTIAL/PLATELET - Abnormal; Notable for the following components:      Result Value   WBC 13.2 (*)    Neutro Abs 10.4 (*)    Abs Immature Granulocytes 0.09 (*)    All other components within normal limits  COMPREHENSIVE METABOLIC PANEL - Abnormal; Notable for the following components:   Glucose, Bld 130 (*)    All other components within normal limits  CULTURE, BLOOD (ROUTINE X 2)  CULTURE, BLOOD  (ROUTINE X 2)  LACTIC ACID, PLASMA  LACTIC ACID, PLASMA    EKG None  Radiology DG Tibia/Fibula Left  Result Date: 10/30/2022 CLINICAL DATA:  infection, assess for osteo EXAM: LEFT TIBIA AND FIBULA - 2 VIEW; LEFT FOOT - COMPLETE 3+ VIEW COMPARISON:  CT 05/16/2021 FINDINGS: The proximal tibia and fibula are unremarkable. Posterior changes of distal tibia fracture ORIF with tibiotalar arthrodesis and syndesmotic fixation, as well as distal fibular intramedullary fixation. Unchanged alignment  the distal fibular fracture with healed appearance and persistent cortical step-off at the posterosuperior aspect, similar in appearance to prior ankle CT in September 2022. There appears to be solid arthrodesis of the tibiotalar joint. Intact hardware without evidence of loosening. Dorsal talar spur. There is no bone erosion or frank bony destruction. There is soft tissue swelling of the lower leg, ankle, and foot. Plantar dorsal calcaneal spurring. IMPRESSION: Soft tissue swelling of the left lower leg, foot, and ankle. Intact ankle fixation hardware without evidence of complication. No evidence of acute osseous abnormality. Electronically Signed   By: Maurine Simmering M.D.   On: 10/30/2022 10:05   DG Foot Complete Left  Result Date: 10/30/2022 CLINICAL DATA:  infection, assess for osteo EXAM: LEFT TIBIA AND FIBULA - 2 VIEW; LEFT FOOT - COMPLETE 3+ VIEW COMPARISON:  CT 05/16/2021 FINDINGS: The proximal tibia and fibula are unremarkable. Posterior changes of distal tibia fracture ORIF with tibiotalar arthrodesis and syndesmotic fixation, as well as distal fibular intramedullary fixation. Unchanged alignment the distal fibular fracture with healed appearance and persistent cortical step-off at the posterosuperior aspect, similar in appearance to prior ankle CT in September 2022. There appears to be solid arthrodesis of the tibiotalar joint. Intact hardware without evidence of loosening. Dorsal talar spur. There is no bone  erosion or frank bony destruction. There is soft tissue swelling of the lower leg, ankle, and foot. Plantar dorsal calcaneal spurring. IMPRESSION: Soft tissue swelling of the left lower leg, foot, and ankle. Intact ankle fixation hardware without evidence of complication. No evidence of acute osseous abnormality. Electronically Signed   By: Maurine Simmering M.D.   On: 10/30/2022 10:05    Procedures Procedures    Medications Ordered in ED Medications  vancomycin (VANCOCIN) IVPB 1000 mg/200 mL premix (has no administration in time range)  morphine (PF) 4 MG/ML injection 4 mg (4 mg Intravenous Given 10/30/22 1019)  ondansetron (ZOFRAN) injection 4 mg (4 mg Intravenous Given 10/30/22 1017)  ceFEPIme (MAXIPIME) 2 g in sodium chloride 0.9 % 100 mL IVPB (0 g Intravenous Stopped 10/30/22 1131)  HYDROmorphone (DILAUDID) injection 1 mg (1 mg Intravenous Given 10/30/22 1132)    ED Course/ Medical Decision Making/ A&P                             Medical Decision Making Amount and/or Complexity of Data Reviewed Labs: ordered. Radiology: ordered.  Risk Prescription drug management. Decision regarding hospitalization.   47 y.o. male presents to the ED with complaints of redness swelling and pain in the left lower leg, this involves an extensive number of treatment options, and is a complaint that carries with it a high risk of complications and morbidity.  The differential diagnosis includes cellulitis, abscess, osteomyelitis, infection of hardware from prior ankle surgery, arterial insufficiency.  On arrival pt is nontoxic, vitals with temp of 99, mildly tachycardic on arrival but normotensive, no current signs of sepsis.   Additional history obtained from chart review. Previous records obtained and reviewed noting recent urgent care and ED visits as well as prior surgical records  I ordered medication including morphine and Zofran for pain management and IV vancomycin and cefepime for persistent  cellulitis that has failed outpatient therapy.   Lab Tests:  I Ordered, reviewed, and interpreted labs, which included: Cytosis of 13.2 increased from the ED visit on 1/23, stable hemoglobin, glucose 130 but no electrolyte derangements and normal lactic acid, blood cultures pending.  Imaging  Studies ordered:  I ordered imaging studies which included x-rays of the left foot and tib-fib, I independently visualized and interpreted imaging which showed soft tissue swelling, hardware in place, no clear evidence of osteo on x-ray.  ED Course:   Pain without much improvement after initial round of medication, IV Dilaudid ordered.  A consult placed to orthopedics given refractory cellulitis despite 2 rounds of outpatient antibiotics with directly underlying hardware.  PA Orion Crook with Ortho has seen and evaluated patient.  Dr. Lucia Gaskins will see patient and make further imaging recommendations but no planned surgery at this time agree with plan for medicine admission and IV antibiotics.  Consult placed to [facility] hospitalist for admission, case discussed with Dr. Karmen Bongo who will see and admit the patient.     Portions of this note were generated with [employer]. Dictation errors may occur despite best attempts at proofreading.         Final Clinical Impression(s) / ED Diagnoses Final diagnoses:  Cellulitis of left lower extremity    Rx / DC Orders ED Discharge Orders     None         Janet Berlin 10/30/22 1621    Davonna Belling, MD 10/31/22 [phone]

## 2022-10-30 NOTE — Consult Note (Signed)
Reason for Consult:Left leg cellulitis Referring Physician: Delphina Cahill Time called: [name] Time at bedside: Narberth is an 47 y.o. male.  HPI: [name] developed swelling and redness and pain in his left ankle almost a month ago. He was placed on Keflex and improved significantly but not completely. He was then placed on doxy and did not improve at all at returns today with continued pain, redness, and swelling. He does not remember any cuts or similar when it started. He had chills and sweats when it first started but not lately. Denies prior e/o similar since last surgery.  Past Medical History:  Diagnosis Date   Cellulitis    History of kidney stones    Hypertension    per pt, resolved after weight loss    Past Surgical History:  Procedure Laterality Date   ANKLE ARTHROSCOPY WITH ARTHRODESIS Left 06/25/2021   Procedure: LEFT ANKLE ARTHRODESIS,;  Surgeon: Erle Crocker, MD;  Location: [facility];  Service: Orthopedics;  Laterality: Left;   INGUINAL HERNIA REPAIR Right 2008   KNEE ARTHROSCOPY Right 1994   ORIF ANKLE FRACTURE Left 10/02/2020   Procedure: OPEN REDUCTION INTERNAL FIXATION (ORIF) ANKLE FRACTURE;  Surgeon: Nicholes Stairs, MD;  Location: [facility];  Service: Orthopedics;  Laterality: Left;   VASECTOMY      Family History  Problem Relation Age of Onset   Heart murmur Father     Social History:  reports that he has been smoking cigarettes. He has been smoking an average of 1 pack per day. He has never used smokeless tobacco. He reports current alcohol use. He reports that he does not use drugs.  Allergies:  Allergies  Allergen Reactions   Tomato Nausea And Vomiting    Medications: I have reviewed the patient's current medications.  Results for orders placed or performed during the hospital encounter of 10/30/22 (from the past 48 hour(s))  CBC with Differential     Status: Abnormal   Collection Time: 10/30/22  9:11 AM  Result Value Ref Range    WBC 13.2 (H) 4.0 - 10.5 K/uL   RBC 5.27 4.22 - 5.81 MIL/uL   Hemoglobin 16.4 13.0 - 17.0 g/dL   HCT 46.6 39.0 - 52.0 %   MCV 88.4 80.0 - 100.0 fL   MCH 31.1 26.0 - 34.0 pg   MCHC 35.2 30.0 - 36.0 g/dL   RDW 12.7 11.5 - 15.5 %   Platelets 210 150 - 400 K/uL   nRBC 0.0 0.0 - 0.2 %   Neutrophils Relative % 79 %   Neutro Abs 10.4 (H) 1.7 - 7.7 K/uL   Lymphocytes Relative 12 %   Lymphs Abs 1.6 0.7 - 4.0 K/uL   Monocytes Relative 7 %   Monocytes Absolute 0.9 0.1 - 1.0 K/uL   Eosinophils Relative 1 %   Eosinophils Absolute 0.1 0.0 - 0.5 K/uL   Basophils Relative 0 %   Basophils Absolute 0.0 0.0 - 0.1 K/uL   Immature Granulocytes 1 %   Abs Immature Granulocytes 0.09 (H) 0.00 - 0.07 K/uL    Comment: Performed at [facility], [address], [place], [place] [address]  Comprehensive metabolic panel     Status: Abnormal   Collection Time: 10/30/22  9:11 AM  Result Value Ref Range   Sodium 137 135 - 145 mmol/L   Potassium 3.9 3.5 - 5.1 mmol/L   Chloride 104 98 - 111 mmol/L   CO2 24 22 - 32 mmol/L  Glucose, Bld 130 (H) 70 - 99 mg/dL    Comment: Glucose reference range applies only to samples taken after fasting for at least 8 hours.   BUN 15 6 - 20 mg/dL   Creatinine, Ser 1.02 0.61 - 1.24 mg/dL   Calcium 9.1 8.9 - 10.3 mg/dL   Total Protein 7.6 6.5 - 8.1 g/dL   Albumin 3.7 3.5 - 5.0 g/dL   AST 22 15 - 41 U/L   ALT 25 0 - 44 U/L   Alkaline Phosphatase 75 38 - 126 U/L   Total Bilirubin 0.7 0.3 - 1.2 mg/dL   GFR, Estimated >60 >60 mL/min    Comment: (NOTE) Calculated using the CKD-EPI Creatinine Equation (2021)    Anion gap 9 5 - 15    Comment: Performed at [facility] [address], [place], [place] [address]  Lactic acid, plasma     Status: None   Collection Time: 10/30/22  9:11 AM  Result Value Ref Range   Lactic Acid, Venous 1.6 0.5 - 1.9 mmol/L    Comment: Performed at [facility] [address] [place], [place] [address]    DG Tibia/Fibula  Left  Result Date: 10/30/2022 CLINICAL DATA:  infection, assess for osteo EXAM: LEFT TIBIA AND FIBULA - 2 VIEW; LEFT FOOT - COMPLETE 3+ VIEW COMPARISON:  CT 05/16/2021 FINDINGS: The proximal tibia and fibula are unremarkable. Posterior changes of distal tibia fracture ORIF with tibiotalar arthrodesis and syndesmotic fixation, as well as distal fibular intramedullary fixation. Unchanged alignment the distal fibular fracture with healed appearance and persistent cortical step-off at the posterosuperior aspect, similar in appearance to prior ankle CT in September 2022. There appears to be solid arthrodesis of the tibiotalar joint. Intact hardware without evidence of loosening. Dorsal talar spur. There is no bone erosion or frank bony destruction. There is soft tissue swelling of the lower leg, ankle, and foot. Plantar dorsal calcaneal spurring. IMPRESSION: Soft tissue swelling of the left lower leg, foot, and ankle. Intact ankle fixation hardware without evidence of complication. No evidence of acute osseous abnormality. Electronically Signed   By: Maurine Simmering M.D.   On: 10/30/2022 10:05   DG Foot Complete Left  Result Date: 10/30/2022 CLINICAL DATA:  infection, assess for osteo EXAM: LEFT TIBIA AND FIBULA - 2 VIEW; LEFT FOOT - COMPLETE 3+ VIEW COMPARISON:  CT 05/16/2021 FINDINGS: The proximal tibia and fibula are unremarkable. Posterior changes of distal tibia fracture ORIF with tibiotalar arthrodesis and syndesmotic fixation, as well as distal fibular intramedullary fixation. Unchanged alignment the distal fibular fracture with healed appearance and persistent cortical step-off at the posterosuperior aspect, similar in appearance to prior ankle CT in September 2022. There appears to be solid arthrodesis of the tibiotalar joint. Intact hardware without evidence of loosening. Dorsal talar spur. There is no bone erosion or frank bony destruction. There is soft tissue swelling of the lower leg, ankle, and foot.  Plantar dorsal calcaneal spurring. IMPRESSION: Soft tissue swelling of the left lower leg, foot, and ankle. Intact ankle fixation hardware without evidence of complication. No evidence of acute osseous abnormality. Electronically Signed   By: Maurine Simmering M.D.   On: 10/30/2022 10:05    Review of Systems  HENT:  Negative for ear discharge, ear pain, hearing loss and tinnitus.   Eyes:  Negative for photophobia and pain.  Respiratory:  Negative for cough and shortness of breath.   Cardiovascular:  Negative for chest pain.  Gastrointestinal:  Negative for abdominal  pain, nausea and vomiting.  Genitourinary:  Negative for dysuria, flank pain, frequency and urgency.  Musculoskeletal:  Positive for arthralgias (Left ankle). Negative for back pain, myalgias and neck pain.  Neurological:  Negative for dizziness and headaches.  Hematological:  Does not bruise/bleed easily.  Psychiatric/Behavioral:  The patient is not nervous/anxious.    Blood pressure (!) 133/108, pulse 94, temperature 99 F (37.2 C), resp. rate (!) 26, height '6\' 1"'$  (1.854 m), weight 127 kg, SpO2 97 %. Physical Exam Constitutional:      General: He is not in acute distress.    Appearance: He is well-developed. He is not diaphoretic.  HENT:     Head: Normocephalic and atraumatic.  Eyes:     General: No scleral icterus.       Right eye: No discharge.        Left eye: No discharge.     Conjunctiva/sclera: Conjunctivae normal.  Cardiovascular:     Rate and Rhythm: Normal rate and regular rhythm.  Pulmonary:     Effort: Pulmonary effort is normal. No respiratory distress.  Musculoskeletal:     Cervical back: Normal range of motion.     Comments: LLE No traumatic wounds. Foot/lower leg erythematous to mid calf. Small ulceration with discharge medially.  Mod-severe TTP to light touch  No knee or ankle effusion  Knee stable to varus/ valgus and anterior/posterior stress  Sens DPN, SPN, TN intact  Motor EHL, ext, flex, evers  5/5  DP 1+, PT 1+, 2+ NP edema  Skin:    General: Skin is warm and dry.  Neurological:     Mental Status: He is alert.  Psychiatric:        Mood and Affect: Mood normal.        Behavior: Behavior normal.     Assessment/Plan: Left lower leg cellulitis -- Admit for IV abx. Will likely need more advanced imaging to determine whether hardware involved or existence of any fluid collections. Dr. Lucia Gaskins to review and make recommendations. No plans for surgery at this time.    Lisette Abu, PA-C Orthopedic Surgery [phone] 10/30/2022, 10:25 AM

## 2022-10-30 NOTE — ED Triage Notes (Signed)
Pt. Stated, I had a bad fall in 2022 and had 2 surgeries in the winter due to falling on ice. About mid January I started having a sorw that had opened and went to UC and then to ER and put on antibiotics and it got better but the pain is back and a lot worse and it took me 30 min to walk across my house this morning. Left leg swollen and foot swollen and red

## 2022-10-30 NOTE — Progress Notes (Signed)
Pt arrived to the unit for ED. Vitals stable, pt pain 8/10 in his left leg. Clean left leg with soap and water, applied xerofom, kelix, and ace wrap. Gave morphine for pain. Notified MD because patient stated "nothing works for this pain". Call button, room phone, cell phone are in the bed with patient.

## 2022-10-30 NOTE — Consult Note (Signed)
[facility] Nurse Consult Note: Reason for Consult:Left lower leg cellulitis, ongoing since 09/29/22.  History ORIF and joint fusion to left ankle 06/2021.  Is admitted for IV antibiotics and ongoing imaging of hardware.  Wound type: infectious Pressure Injury POA: NA Measurement:generalized edema and erythema to left lower leg.  0.5 cm scabbed lesion to left medial lower leg some drainage noted Wound [id number] Drainage (amount, consistency, odor) weeping leg and drainage from scabbed area.  Periwound:edema and erythema Dressing procedure/placement/frequency: Cleanse left leg with soap and water.  Apply Xeroform to scabbed lesion and any moist weeping areas.  Wrap leg with kerlix and secure with ace bandage  Change daily.  Will not follow at this time.  Please re-consult if needed.  Estrellita Ludwig MSN, RN, FNP-BC CWON Wound, Ostomy, Continence Nurse [facility] [phone] Pager [phone]

## 2022-10-30 NOTE — H&P (Signed)
History and Physical    Patient: [name] [id number] DOB: 12-17-1975 DOA: 10/30/2022 DOS: the patient was seen and examined on 10/30/2022 PCP: Patient, No Pcp Per  Patient coming from: Home - lives Lives alone; [place]: [place], [place], [phone]    Chief Complaint: leg infection  HPI: [name] is a 47 y.o. male with medical history significant of HTN presenting with refractory lower leg cellulitis.  He reports that his left ankle is full of hardware from 2022. Had avascular necrosis with second surgery, ankle fusion. He has done ok. Middle of last month he got an infection with fever/chills. Took abx without resolution. He came back to the ER. 2 weeks worth of doxy, 80% better but has been off for about a week. It hurt yesterday after work and got worse overnight. No recent fevers. He has one spot that is an ulcer, some draining. No h/o DM.     ER Course:  2022 - fall with trimalleolar fracture s/p repair -> avascular necrosis and revision by Dr. Lucia Gaskins.   Cellulitis on LLE and ankle, no purulence.  1/15 - Keflex, mildly better and worsened.  2/23 - doxy x 2 weeks without improvement.  Now up leg and worsening.  Ortho will consult, will need advanced imaging.  Started on Vanc/Cefepime.  WBC 11 -> 13.  No fevers.       Review of Systems: As mentioned in the history of present illness. All other systems reviewed and are negative. Past Medical History:  Diagnosis Date   Cellulitis    History of kidney stones    Hypertension    per pt, resolved after weight loss   Past Surgical History:  Procedure Laterality Date   ANKLE ARTHROSCOPY WITH ARTHRODESIS Left 06/25/2021   Procedure: LEFT ANKLE ARTHRODESIS,;  Surgeon: Erle Crocker, MD;  Location: [facility];  Service: Orthopedics;  Laterality: Left;   INGUINAL HERNIA REPAIR Right 2008   KNEE ARTHROSCOPY Right 1994   ORIF ANKLE FRACTURE Left 10/02/2020   Procedure: OPEN REDUCTION INTERNAL FIXATION (ORIF)  ANKLE FRACTURE;  Surgeon: Nicholes Stairs, MD;  Location: [facility];  Service: Orthopedics;  Laterality: Left;   VASECTOMY     Social History:  reports that he has been smoking cigarettes. He has been smoking an average of 1 pack per day. He has never used smokeless tobacco. He reports current alcohol use. He reports that he does not use drugs.  Allergies  Allergen Reactions   Tomato Nausea And Vomiting    Family History  Problem Relation Age of Onset   Heart murmur Father     Prior to Admission medications   Medication Sig Start Date End Date Taking? Authorizing Provider  benzonatate (TESSALON) 100 MG capsule Take 1 capsule (100 mg total) by mouth every 8 (eight) hours. 09/29/22  Yes Talbot Grumbling, FNP  ibuprofen (ADVIL) 200 MG tablet Take 800 mg by mouth every 8 (eight) hours as needed for headache, mild pain or moderate pain.   Yes [provider]  oxymetazoline (AFRIN) 0.05 % nasal spray Place 1-2 sprays into both nostrils at bedtime as needed for congestion.   Yes [provider]  urea (CARMOL) 40 % CREA Apply 1 application  topically daily.   Yes [provider]  doxycycline (VIBRAMYCIN) 100 MG capsule Take 1 capsule (100 mg total) by mouth 2 (two) times daily. One po bid x 7 days Patient not taking: Reported on [id number]   Delora Fuel, MD  Physical Exam: Vitals:   10/30/22 1100 10/30/22 1256 10/30/22 1300 10/30/22 1457  BP: (!) 146/86  (!) 148/81 136/74  Pulse: 84  93 96  Resp: (!) 21  17 19  $ Temp:  98.6 F (37 C)  98.2 F (36.8 C)  TempSrc:  Oral  Oral  SpO2: 94%  96% 97%  Weight:      Height:       General:  Appears calm and comfortable and is in NAD Eyes:  PERRL, EOMI, normal lids, iris ENT:  grossly normal hearing, lips & tongue, mmm Neck:  no LAD, masses or thyromegaly Cardiovascular:  RRR, no m/r/g. No LE edema.  Respiratory:   CTA bilaterally with no wheezes/rales/rhonchi.  Normal respiratory effort. Abdomen:   soft, NT, ND Skin:  LLE with marked erythema along the entire lower leg, small ulceration along medial ankle without obvious drainage; + heel protrusion that he reports is a chronic bone spur         Musculoskeletal:  grossly normal tone BUE/BLE, good ROM, no bony abnormality Lower extremity:  No LE edema.  Limited foot exam with no ulcerations.  2+ distal pulses. Psychiatric:  blunted mood and affect, speech fluent and appropriate, AOx3 Neurologic:  CN 2-12 grossly intact, moves all extremities in coordinated fashion   Radiological Exams on Admission: Independently reviewed - see discussion in A/P where applicable  DG Tibia/Fibula Left  Result Date: 10/30/2022 CLINICAL DATA:  infection, assess for osteo EXAM: LEFT TIBIA AND FIBULA - 2 VIEW; LEFT FOOT - COMPLETE 3+ VIEW COMPARISON:  CT 05/16/2021 FINDINGS: The proximal tibia and fibula are unremarkable. Posterior changes of distal tibia fracture ORIF with tibiotalar arthrodesis and syndesmotic fixation, as well as distal fibular intramedullary fixation. Unchanged alignment the distal fibular fracture with healed appearance and persistent cortical step-off at the posterosuperior aspect, similar in appearance to prior ankle CT in September 2022. There appears to be solid arthrodesis of the tibiotalar joint. Intact hardware without evidence of loosening. Dorsal talar spur. There is no bone erosion or frank bony destruction. There is soft tissue swelling of the lower leg, ankle, and foot. Plantar dorsal calcaneal spurring. IMPRESSION: Soft tissue swelling of the left lower leg, foot, and ankle. Intact ankle fixation hardware without evidence of complication. No evidence of acute osseous abnormality. Electronically Signed   By: Maurine Simmering M.D.   On: 10/30/2022 10:05   DG Foot Complete Left  Result Date: 10/30/2022 CLINICAL DATA:  infection, assess for osteo EXAM: LEFT TIBIA AND FIBULA - 2 VIEW; LEFT FOOT - COMPLETE 3+ VIEW COMPARISON:  CT  05/16/2021 FINDINGS: The proximal tibia and fibula are unremarkable. Posterior changes of distal tibia fracture ORIF with tibiotalar arthrodesis and syndesmotic fixation, as well as distal fibular intramedullary fixation. Unchanged alignment the distal fibular fracture with healed appearance and persistent cortical step-off at the posterosuperior aspect, similar in appearance to prior ankle CT in September 2022. There appears to be solid arthrodesis of the tibiotalar joint. Intact hardware without evidence of loosening. Dorsal talar spur. There is no bone erosion or frank bony destruction. There is soft tissue swelling of the lower leg, ankle, and foot. Plantar dorsal calcaneal spurring. IMPRESSION: Soft tissue swelling of the left lower leg, foot, and ankle. Intact ankle fixation hardware without evidence of complication. No evidence of acute osseous abnormality. Electronically Signed   By: Maurine Simmering M.D.   On: 10/30/2022 10:05    EKG: not done   Labs on Admission: I have personally reviewed the available  labs and imaging studies at the time of the admission.  Pertinent labs:    Glucose 130 Lactate 1.6, 1.2 WBC 13.2 Blood cultures x 2 pending   Assessment and Plan: Principal Problem:   Cellulitis and abscess of leg Active Problems:   Essential hypertension   Tobacco dependence    LLE Hardware Infection -Patient with h/o recurrent L ankle surgeries -Patient with h/o prior antibiotic treatment with Keflex and Doxy and so has failed outpatient management -Small ulcer is present and now the patient has developed surrounding cellulitis -Negative lactate, no current concerns for sepsis -Will treat with IV antibiotics (Rocephin/Flagyl/Vanc as per the lower extremity wound algorithm) -Orthopedics will consult -It appears that he will need advanced imaging and likely operative intervention -I have ordered ABIs in case revascularization may be indicated -Patient is NPO after midnight in case  he needs a procedure tomorrow -LE wound order set utilized including labs (CRP, ESR, A1c, prealbumin, HIV, and blood cultures) and consults (peripheral vascular navigator; TOC team; wound care; and nutrition)   HTN -Does not take medications or see physicians, denies medical problems -Will follow  Tobacco dependence -Encourage cessation.   -This was discussed with the patient and should be reviewed on an ongoing basis.   -Patch ordered     Advance Care Planning:   Code Status: Full Code - Code status was discussed with the patient and/or family at the time of admission.  The patient would want to receive full resuscitative measures at this time.   Consults: Orthopedics; peripheral vascular navigator; TOC team; wound care; and nutrition  DVT Prophylaxis: Heparin  Family Communication: None present; he declined having me call family at the time of admission  Severity of Illness: The appropriate patient status for this patient is INPATIENT. Inpatient status is judged to be reasonable and necessary in order to provide the required intensity of service to ensure the patient's safety. The patient's presenting symptoms, physical exam findings, and initial radiographic and laboratory data in the context of their chronic comorbidities is felt to place them at high risk for further clinical deterioration. Furthermore, it is not anticipated that the patient will be medically stable for discharge from the hospital within 2 midnights of admission.   * I certify that at the point of admission it is my clinical judgment that the patient will require inpatient hospital care spanning beyond 2 midnights from the point of admission due to high intensity of service, high risk for further deterioration and high frequency of surveillance required.*  Author: Karmen Bongo, MD 10/30/2022 3:38 PM  For on call review [email or web address].

## 2022-10-30 NOTE — Progress Notes (Addendum)
Pharmacy Antibiotic Note  [name] is a 47 y.o. male admitted on 10/30/2022 with  lower extremity wound infection .  Pharmacy has been consulted for vancomycin dosing. WBC 13.2. Scr 1.01 appears at baseline. Tmax 56F.   Plan: Vancomycin 2,000 mg IV x 1, then 1500 mg IV q12h (eAUC 540.1, Scr 1.01, Vd 0.5 for BMI >30)  Metronidazole 500 mg IV q12h per MD Ceftriaxone 2g IV q24h per MD Monitor C&S, renal function, s/sx improvement, vancomycin levels at [facility] as indicated    Height: 6' 1"$  (185.4 cm) Weight: 127 kg (280 lb) IBW/kg (Calculated) : 79.9  Temp (24hrs), Avg:98.8 F (37.1 C), Min:98.6 F (37 C), Max:99 F (37.2 C)  Recent Labs  Lab 10/30/22 0911 10/30/22 1145  WBC 13.2*  --   CREATININE 1.02  --   LATICACIDVEN 1.6 1.2    Estimated Creatinine Clearance: 123.6 mL/min (by C-G formula based on SCr of 1.02 mg/dL).    Allergies  Allergen Reactions   Tomato Nausea And Vomiting    Antimicrobials this admission: Cefepime 2/15 x 1 Metronidazole 2/15 >>  Vancomycin 2/15 >>  Ceftriaxone 2/15 >>   Dose adjustments this admission:   Microbiology results: 2/15 BCx: sent  Eliseo Gum, PharmD PGY1 Pharmacy Resident   10/30/2022  2:29 PM

## 2022-10-30 NOTE — ED Notes (Signed)
ED TO INPATIENT HANDOFF REPORT  ED Nurse Name and Phone #:  [phone]  S Name/Age/Gender [name] 47 y.o. male Room/Bed: 007C/007C  Code Status   Code Status: Full Code  Home/SNF/Other Home Patient oriented to: self, place, time, and situation Is this baseline? Yes   Triage Complete: Triage complete  Chief Complaint Cellulitis and abscess of leg [L03.119, L02.419]  Triage Note Pt. Stated, I had a bad fall in 2022 and had 2 surgeries in the winter due to falling on ice. About mid January I started having a sorw that had opened and went to UC and then to ER and put on antibiotics and it got better but the pain is back and a lot worse and it took me 30 min to walk across my house this morning. Left leg swollen and foot swollen and red    Allergies Allergies  Allergen Reactions   Tomato Nausea And Vomiting    Level of Care/Admitting Diagnosis ED Disposition     ED Disposition  Admit   Condition  --   Comment  Hospital Area: [facility] [[id number]]  Level of Care: Med-Surg [16]  May admit patient to [facility] or [facility] if equivalent level of care is available:: No  Covid Evaluation: Asymptomatic - no recent exposure (last 10 days) testing not required  Diagnosis: Cellulitis and abscess of leg [id number]  Admitting Physician: Karmen Bongo [[id number]]  Attending Physician: Karmen Bongo [id number]  Certification:: I certify this patient will need inpatient services for at least 2 midnights  Estimated Length of Stay: 4          B Medical/Surgery History Past Medical History:  Diagnosis Date   Cellulitis    History of kidney stones    Hypertension    per pt, resolved after weight loss   Past Surgical History:  Procedure Laterality Date   ANKLE ARTHROSCOPY WITH ARTHRODESIS Left 06/25/2021   Procedure: LEFT ANKLE ARTHRODESIS,;  Surgeon: Erle Crocker, MD;  Location: [facility];  Service: Orthopedics;  Laterality: Left;   INGUINAL  HERNIA REPAIR Right 2008   KNEE ARTHROSCOPY Right 1994   ORIF ANKLE FRACTURE Left 10/02/2020   Procedure: OPEN REDUCTION INTERNAL FIXATION (ORIF) ANKLE FRACTURE;  Surgeon: Nicholes Stairs, MD;  Location: [facility];  Service: Orthopedics;  Laterality: Left;   VASECTOMY       A IV Location/Drains/Wounds Patient Lines/Drains/Airways Status     Active Line/Drains/Airways     Name Placement date Placement time Site Days   Peripheral IV 10/30/22 20 G Anterior;Left Forearm 10/30/22  1008  Forearm  less than 1   Peripheral IV 10/30/22 18 G Anterior;Right Forearm 10/30/22  1008  Forearm  less than 1            Intake/Output Last 24 hours No intake or output data in the 24 hours ending 10/30/22 1306  Labs/Imaging Results for orders placed or performed during the hospital encounter of 10/30/22 (from the past 48 hour(s))  CBC with Differential     Status: Abnormal   Collection Time: 10/30/22  9:11 AM  Result Value Ref Range   WBC 13.2 (H) 4.0 - 10.5 K/uL   RBC 5.27 4.22 - 5.81 MIL/uL   Hemoglobin 16.4 13.0 - 17.0 g/dL   HCT 46.6 39.0 - 52.0 %   MCV 88.4 80.0 - 100.0 fL   MCH 31.1 26.0 - 34.0 pg   MCHC 35.2 30.0 - 36.0 g/dL   RDW 12.7 11.5 - 15.5 %  Platelets 210 150 - 400 K/uL   nRBC 0.0 0.0 - 0.2 %   Neutrophils Relative % 79 %   Neutro Abs 10.4 (H) 1.7 - 7.7 K/uL   Lymphocytes Relative 12 %   Lymphs Abs 1.6 0.7 - 4.0 K/uL   Monocytes Relative 7 %   Monocytes Absolute 0.9 0.1 - 1.0 K/uL   Eosinophils Relative 1 %   Eosinophils Absolute 0.1 0.0 - 0.5 K/uL   Basophils Relative 0 %   Basophils Absolute 0.0 0.0 - 0.1 K/uL   Immature Granulocytes 1 %   Abs Immature Granulocytes 0.09 (H) 0.00 - 0.07 K/uL    Comment: Performed at [facility] [address], [place], [place] [address]  Comprehensive metabolic panel     Status: Abnormal   Collection Time: 10/30/22  9:11 AM  Result Value Ref Range   Sodium 137 135 - 145 mmol/L   Potassium 3.9 3.5 - 5.1 mmol/L    Chloride 104 98 - 111 mmol/L   CO2 24 22 - 32 mmol/L   Glucose, Bld 130 (H) 70 - 99 mg/dL    Comment: Glucose reference range applies only to samples taken after fasting for at least 8 hours.   BUN 15 6 - 20 mg/dL   Creatinine, Ser 1.02 0.61 - 1.24 mg/dL   Calcium 9.1 8.9 - 10.3 mg/dL   Total Protein 7.6 6.5 - 8.1 g/dL   Albumin 3.7 3.5 - 5.0 g/dL   AST 22 15 - 41 U/L   ALT 25 0 - 44 U/L   Alkaline Phosphatase 75 38 - 126 U/L   Total Bilirubin 0.7 0.3 - 1.2 mg/dL   GFR, Estimated >60 >60 mL/min    Comment: (NOTE) Calculated using the CKD-EPI Creatinine Equation (2021)    Anion gap 9 5 - 15    Comment: Performed at [facility] [address] [place], [place] [address]  Lactic acid, plasma     Status: None   Collection Time: 10/30/22  9:11 AM  Result Value Ref Range   Lactic Acid, Venous 1.6 0.5 - 1.9 mmol/L    Comment: Performed at [facility] [address], [place], [place] [address]  Lactic acid, plasma     Status: None   Collection Time: 10/30/22 11:45 AM  Result Value Ref Range   Lactic Acid, Venous 1.2 0.5 - 1.9 mmol/L    Comment: Performed at [facility] [address] [place], [place] [address]   DG Tibia/Fibula Left  Result Date: 10/30/2022 CLINICAL DATA:  infection, assess for osteo EXAM: LEFT TIBIA AND FIBULA - 2 VIEW; LEFT FOOT - COMPLETE 3+ VIEW COMPARISON:  CT 05/16/2021 FINDINGS: The proximal tibia and fibula are unremarkable. Posterior changes of distal tibia fracture ORIF with tibiotalar arthrodesis and syndesmotic fixation, as well as distal fibular intramedullary fixation. Unchanged alignment the distal fibular fracture with healed appearance and persistent cortical step-off at the posterosuperior aspect, similar in appearance to prior ankle CT in September 2022. There appears to be solid arthrodesis of the tibiotalar joint. Intact hardware without evidence of loosening. Dorsal talar spur. There is no bone erosion or frank bony destruction.  There is soft tissue swelling of the lower leg, ankle, and foot. Plantar dorsal calcaneal spurring. IMPRESSION: Soft tissue swelling of the left lower leg, foot, and ankle. Intact ankle fixation hardware without evidence of complication. No evidence of acute osseous abnormality. Electronically Signed   By: Ileene Patrick.D.  On: 10/30/2022 10:05   DG Foot Complete Left  Result Date: 10/30/2022 CLINICAL DATA:  infection, assess for osteo EXAM: LEFT TIBIA AND FIBULA - 2 VIEW; LEFT FOOT - COMPLETE 3+ VIEW COMPARISON:  CT 05/16/2021 FINDINGS: The proximal tibia and fibula are unremarkable. Posterior changes of distal tibia fracture ORIF with tibiotalar arthrodesis and syndesmotic fixation, as well as distal fibular intramedullary fixation. Unchanged alignment the distal fibular fracture with healed appearance and persistent cortical step-off at the posterosuperior aspect, similar in appearance to prior ankle CT in September 2022. There appears to be solid arthrodesis of the tibiotalar joint. Intact hardware without evidence of loosening. Dorsal talar spur. There is no bone erosion or frank bony destruction. There is soft tissue swelling of the lower leg, ankle, and foot. Plantar dorsal calcaneal spurring. IMPRESSION: Soft tissue swelling of the left lower leg, foot, and ankle. Intact ankle fixation hardware without evidence of complication. No evidence of acute osseous abnormality. Electronically Signed   By: Maurine Simmering M.D.   On: 10/30/2022 10:05    Pending Labs Unresulted Labs (From admission, onward)     Start     Ordered   10/31/22 [id number]  Basic metabolic panel  Tomorrow morning,   R        10/30/22 1259   10/31/22 0500  CBC  Tomorrow morning,   R        10/30/22 1259   10/30/22 1254  Hemoglobin A1c  (COPD / Pneumonia / Cellulitis / Lower Extremity Wound)  Once,   R        10/30/22 1259   10/30/22 1254  Sedimentation rate  (COPD / Pneumonia / Cellulitis / Lower Extremity Wound)  Once,   R         10/30/22 1259   10/30/22 1254  C-reactive protein  (COPD / Pneumonia / Cellulitis / Lower Extremity Wound)  Once,   R        10/30/22 1259   10/30/22 1254  Prealbumin  (COPD / Pneumonia / Cellulitis / Lower Extremity Wound)  Once,   R        10/30/22 1259   10/30/22 1254  HIV Antibody (routine testing w rflx)  (HIV Antibody (Routine testing w reflex) panel)  Once,   R        10/30/22 1259   10/30/22 0916  Culture, blood (routine x 2)  BLOOD CULTURE X 2,   R (with STAT occurrences)      10/30/22 0916            Vitals/Pain Today's Vitals   10/30/22 1022 10/30/22 1100 10/30/22 1228 10/30/22 1256  BP:  (!) 146/86    Pulse:  84    Resp:  (!) 21    Temp:    98.6 F (37 C)  TempSrc:    Oral  SpO2:  94%    Weight:      Height:      PainSc: 10-Worst pain ever  4      Isolation Precautions No active isolations  Medications Medications  vancomycin (VANCOCIN) IVPB 1000 mg/200 mL premix (1,000 mg Intravenous New Bag/Given 10/30/22 1231)  cefTRIAXone (ROCEPHIN) 2 g in sodium chloride 0.9 % 100 mL IVPB (has no administration in time range)  metroNIDAZOLE (FLAGYL) IVPB 500 mg (has no administration in time range)  0.9 %  sodium chloride infusion (has no administration in time range)  acetaminophen (TYLENOL) tablet 650 mg (has no administration in time range)    Or  acetaminophen (  TYLENOL) suppository 650 mg (has no administration in time range)  morphine (PF) 2 MG/ML injection 2 mg (has no administration in time range)  docusate sodium (COLACE) capsule 100 mg (has no administration in time range)  polyethylene glycol (MIRALAX / GLYCOLAX) packet 17 g (has no administration in time range)  bisacodyl (DULCOLAX) EC tablet 5 mg (has no administration in time range)  ondansetron (ZOFRAN) tablet 4 mg (has no administration in time range)    Or  ondansetron (ZOFRAN) injection 4 mg (has no administration in time range)  nicotine (NICODERM CQ - dosed in mg/24 hours) patch 14 mg (has no  administration in time range)  hydrALAZINE (APRESOLINE) injection 5 mg (has no administration in time range)  heparin injection 5,000 Units (has no administration in time range)  oxyCODONE (Oxy IR/ROXICODONE) immediate release tablet 5 mg (has no administration in time range)  morphine (PF) 4 MG/ML injection 4 mg (4 mg Intravenous Given 10/30/22 1019)  ondansetron (ZOFRAN) injection 4 mg (4 mg Intravenous Given 10/30/22 1017)  ceFEPIme (MAXIPIME) 2 g in sodium chloride 0.9 % 100 mL IVPB (0 g Intravenous Stopped 10/30/22 1131)  HYDROmorphone (DILAUDID) injection 1 mg (1 mg Intravenous Given 10/30/22 1132)    Mobility walks     Focused Assessments   R Recommendations: See Admitting Provider Note  Report given to:   Additional Notes:

## 2022-10-31 ENCOUNTER — Inpatient Hospital Stay (HOSPITAL_COMMUNITY): Payer: Worker's Compensation

## 2022-10-31 DIAGNOSIS — L03116 Cellulitis of left lower limb: Secondary | ICD-10-CM | POA: Diagnosis not present

## 2022-10-31 DIAGNOSIS — L0390 Cellulitis, unspecified: Secondary | ICD-10-CM

## 2022-10-31 DIAGNOSIS — B955 Unspecified streptococcus as the cause of diseases classified elsewhere: Secondary | ICD-10-CM | POA: Insufficient documentation

## 2022-10-31 DIAGNOSIS — L02419 Cutaneous abscess of limb, unspecified: Secondary | ICD-10-CM | POA: Diagnosis not present

## 2022-10-31 DIAGNOSIS — L03119 Cellulitis of unspecified part of limb: Secondary | ICD-10-CM | POA: Diagnosis not present

## 2022-10-31 LAB — BLOOD CULTURE ID PANEL (REFLEXED) - BCID2

## 2022-10-31 LAB — BASIC METABOLIC PANEL
Anion gap: 9 (ref 5–15)
BUN: 9 mg/dL (ref 6–20)
CO2: 24 mmol/L (ref 22–32)
Calcium: 8.2 mg/dL — ABNORMAL LOW (ref 8.9–10.3)
Chloride: 98 mmol/L (ref 98–111)
Creatinine, Ser: 0.94 mg/dL (ref 0.61–1.24)
GFR, Estimated: >60 mL/min (ref >60)
Glucose, Bld: 115 mg/dL — ABNORMAL HIGH (ref 70–99)
Potassium: 3.9 mmol/L (ref 3.5–5.1)
Sodium: 131 mmol/L — ABNORMAL LOW (ref 135–145)

## 2022-10-31 LAB — CBC
HCT: 41.1 % (ref 39.0–52.0)
Hemoglobin: 14.2 g/dL (ref 13.0–17.0)
MCH: 30.8 pg (ref 26.0–34.0)
MCHC: 34.5 g/dL (ref 30.0–36.0)
MCV: 89.2 fL (ref 80.0–100.0)
Platelets: 174 10*3/uL (ref 150–400)
RBC: 4.61 MIL/uL (ref 4.22–5.81)
RDW: 12.9 % (ref 11.5–15.5)
WBC: 16.5 10*3/uL — ABNORMAL HIGH (ref 4.0–10.5)
nRBC: 0 % (ref 0.0–0.2)

## 2022-10-31 LAB — VAS US ABI WITH/WO TBI
Left ABI: 1.11
Right ABI: 1.11

## 2022-10-31 MED ORDER — LINEZOLID 600 MG/300ML IV SOLN
600.0000 mg | Freq: Two times a day (BID) | INTRAVENOUS | Status: DC
  Administered 2022-10-31 – 2022-11-02 (×6): 600 mg via INTRAVENOUS
  Filled 2022-10-31 (×7): qty 300

## 2022-10-31 MED ORDER — JUVEN PO PACK
1.0000 | PACK | Freq: Two times a day (BID) | ORAL | Status: DC
  Administered 2022-11-01 – 2022-11-04 (×8): 1 via ORAL
  Filled 2022-10-31 (×6): qty 1

## 2022-10-31 MED ORDER — ENSURE ENLIVE PO LIQD
237.0000 mL | Freq: Two times a day (BID) | ORAL | Status: DC
  Administered 2022-11-01 – 2022-11-04 (×8): 237 mL via ORAL
  Filled 2022-10-31: qty 237

## 2022-10-31 MED ORDER — OXYCODONE HCL 5 MG PO TABS
5.0000 mg | ORAL_TABLET | ORAL | Status: DC | PRN
  Administered 2022-10-31 – 2022-11-03 (×13): 10 mg via ORAL
  Administered 2022-11-03 (×2): 5 mg via ORAL
  Administered 2022-11-03: 10 mg via ORAL
  Filled 2022-10-31: qty 1
  Filled 2022-10-31 (×4): qty 2
  Filled 2022-10-31: qty 1
  Filled 2022-10-31 (×10): qty 2

## 2022-10-31 MED ORDER — GADOBUTROL 1 MMOL/ML IV SOLN
10.0000 mL | Freq: Once | INTRAVENOUS | Status: AC | PRN
  Administered 2022-10-31: 10 mL via INTRAVENOUS

## 2022-10-31 MED ORDER — ADULT MULTIVITAMIN W/MINERALS CH
1.0000 | ORAL_TABLET | Freq: Every day | ORAL | Status: DC
  Administered 2022-10-31 – 2022-11-04 (×5): 1 via ORAL
  Filled 2022-10-31 (×5): qty 1

## 2022-10-31 MED ORDER — HYDROMORPHONE HCL 1 MG/ML IJ SOLN
0.5000 mg | Freq: Once | INTRAMUSCULAR | Status: AC
  Filled 2022-10-31: qty 0.5

## 2022-10-31 NOTE — Progress Notes (Signed)
Initial Nutrition Assessment  DOCUMENTATION CODES:   Obesity unspecified  INTERVENTION:  Ensure Enlive po BID, each supplement provides 350 kcal and 20 grams of protein. 1 packet Juven BID, each packet provides 95 calories, 2.5 grams of protein (collagen), and 9.8 grams of carbohydrate (3 grams sugar) + additional vitamins and minerals to support wound healing MVI with minerals daily  NUTRITION DIAGNOSIS:   Increased nutrient needs related to wound healing as evidenced by estimated needs.  GOAL:   Patient will meet greater than or equal to 90% of their needs   MONITOR:   PO intake, Labs, Weight trends  REASON FOR ASSESSMENT:   Consult Wound healing  ASSESSMENT:   Pt admitted with cellulitis and abscess of L ankle. PMH significant for HTN, kidney stones, prior L tri-malleolar fx s/p ORIF (10/02/20).  Noted concern for vascular etiology or venous statis of recurrent cellulitis. S/p MRI with no evidence of deep infection. Ortho recommends continues medical management of infection and consideration of hardware removal OP.   Pt reports his baseline PO intake typically consists of 2 meals per day. He will eat breakfast around 6:45A before work and again around 2-3P. When he gets off work he may have something at that time. He typically goes to the gym to workout 4-5 times per week. After his workout he will drink a Premier Protein shake. He would like to continue nutrition supplements during admission.   Encouraged adequate PO intake with emphasis on protein with all meals and snacks. Weight loss not encouraged during admission given increased nutrition needs to support wound healing.   Meal completions: 2/15: 75% dinner  Unfortunately there is limited documentation of weight history on file to review within the last year. Pt's weight in 2020 was noted to be 122.5 kg.   Medications: colace, MVI, IV abx  Labs: sodium 129  NUTRITION - FOCUSED PHYSICAL EXAM:  Flowsheet Row  Most Recent Value  Orbital Region No depletion  Upper Arm Region No depletion  Thoracic and Lumbar Region No depletion  Buccal Region No depletion  Temple Region No depletion  Clavicle Bone Region No depletion  Clavicle and Acromion Bone Region No depletion  Scapular Bone Region No depletion  Dorsal Hand No depletion  Patellar Region No depletion  Anterior Thigh Region No depletion  Posterior Calf Region No depletion  Edema (RD Assessment) Moderate  [LLE]  Hair Reviewed  Eyes Reviewed  Mouth Reviewed  Skin Reviewed  Nails Reviewed       Diet Order:   Diet Order             Diet regular Room service appropriate? Yes; Fluid consistency: Thin  Diet effective now                   EDUCATION NEEDS:   Education needs have been addressed  Skin:  Skin Assessment: Reviewed RN Assessment (L pretibial cellulitis)  Last BM:  2/15  Height:   Ht Readings from Last 1 Encounters:  10/30/22 6' 1"$  (1.854 m)    Weight:   Wt Readings from Last 1 Encounters:  10/30/22 127 kg    Ideal Body Weight:  83.6 kg  BMI:  Body mass index is 36.94 kg/m.  Estimated Nutritional Needs:   Kcal:  [id number]  Protein:  115-130g  Fluid:  >/=2L   Clayborne Dana, RDN, LDN Clinical Nutrition
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
## 2022-10-31 NOTE — Consult Note (Deleted)
I have seen and examined the patient. I have personally reviewed the clinical findings, laboratory findings, microbiological data and imaging studies. The assessment and treatment plan was discussed with the Nurse Practitioner Mauricio Po. I agree with her/his recommendations except following additions/corrections.  47 year old male with PMH of hypertension, kidney stones, prior left tri-malleolar # s/p ORIF (10/02/2020) with secondary fall causing opening of wounds s/p 2 courses of cephalexin when seeb by wound care [id number] then complicated with avascular necrosis s/p left ankle arthrodesis (06/25/2021) who presented to the ED 2/15 for left leg redness, swelling and warmth. Seen in the ED 1/22 for worsening cellulitis and left lower leg and was given a 2 weeks course of doxycycline.  Previously seen at the [facility] 1/15  and had ultrasound of leg done and was given course of cephalexin.   Exam - left leg and left ankle diffuselyswollen, warm, erythematous and tender. Calves soft. ROM restricted due to excruciating pain. No crepitus or fluctuance. Small draining wound in the medial ankle.   Labs/Imaging and microbiologic data reviewed   Will switch abtx to ceftriaxone and linezolid for antitoxin effect given excruciating pain in left leg although nec fasc uncommon with Group G strep pending MRI and surgical plans 2 sets of blood cultures ordered for 2/17. Likely source of Group G strep bacteremia is cellulitis and low suspicion for endocarditis  Fu MRI left leg Orthopedics has been consulted, following their recs Monitor CBC, BMP  Dr Baxter Flattery covering this weekend and will follow.    Rosiland Oz, MD Infectious Disease Physician [facility] [address] [place], [place] [address] Phone: [phone]  Fax: [facility]    Date of Admission:  10/30/2022     Total days of antibiotics  2               Reason for Consult: Cellulitis   Referring Provider: Dr. [place] Primary Care Provider: Patient, No Pcp Per   ASSESSMENT:  Mr. Klausen is a 47 y/o male who sustained a traumatic fall in January 2022 resulting in trimalleolar fracture requiring ORIF with course complicated by development of avascular necrosis requiring ankle arthrodesis and now admitted with cellulitis and concern for deeper infection despite outpatient antibiotic therapy. Blood cultures are also positive for Group G Streptococcus in 1/4 bottles. MRI pending and anticipate will require some surgical intervention. Narrow antibiotics to ceftriaxone and will add linezolid for anti-toxin effects although risk of necrotizing is low with Group G Streptococcus. Certainly at risk for complicated healing and further infection given tobacco use. Continue pain management and remaining medical and supportive care per Primary Team.   PLAN:  Continue ceftriaxone and change vancomycin to Zyvox for anti-toxin effect.  Await MRI results for any needed surgical plan.  Therapeutic drug monitoring of platelets while on Zyvox.  Remaining medical and supportive care per Internal Medicine.  Dr. Baxter Flattery is available over the weekend for ID related questions.    Principal Problem:   Cellulitis and abscess of leg Active Problems:   Bacteremia due to Streptococcus   Essential hypertension   Tobacco dependence    docusate sodium  100 mg Oral BID   heparin  5,000 Units Subcutaneous Q8H   nicotine  14 mg Transdermal Daily     HPI: LORREN COMMANDER is a 47 y.o. male with previous history of hypertension presenting to the ED with left leg lower extremity redness and swelling.  Mr. Musolino initially sustained a fall in January 2022 resulting in a left ankle dislocation and trimalleolar fracture requiring ORIF performed on 10/02/20. Was not evaluated by Orthopedics prior to discharge and left AMA. Seen in wound care and per  notes had a secondary fall that resulted in 3 separate areas opening up and was placed on 2 separate courses of Keflex. Last seen by wound care on 01/16/21 with the left lateral ankle incision and small areas anteriorly all closed. 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 Dr. Dorian Heckle performed left ankle arthrodesis on 06/25/21. Tissue samples from surgery were without growth.   Mr. Haverty presented to the Urgent Care on 09/29/22 with swelling and redness of the left foot and ankle. He did have fever/chills about 4 days prior to presentation when the symptoms began. LE venous doppler completed with no evidence of DVT. Prescribed 7 days of Cephalexin 500 mg qid. Seen in the ED on 1/22/4 with initially some improvement and worsening after stopping the Cephalexin. He was discharged with2 week course of doxycycline.  Mr. Ditta completed the course of doxycycline and now presented to the ED with persistent and worsening redness and swelling of the left ankle. There was little to no improvement when taking doxycycline. Had a new ulceration to the medial malleolus with clear drainage. X-rays of the tibia/fibula and foot with soft tissue swelling of the left lower leg, foot and ankle with intact ankle fixation hardware without evidence of complication or osteomyelitis. Orthopedics consulted and performing additional imaging with concern for deep infection with possible need for removal of hardware.  MRI is currently pending.  Febrile with temperature of 101.9 F with leukocytosis and WBC count of 16.5. Blood cultures are positive in 1/4 bottles for Group G Streptococcus identified on BCID. Started on broad spectrum antibiotics with vancomycin, cefepime and metronidazole. Cefepime now narrowed to Ceftriaxone.    Review of Systems: Review of Systems  Constitutional:  Negative for chills, fever and weight loss.   Respiratory:  Negative for cough, shortness of breath and wheezing.   Cardiovascular:  Negative for chest pain and leg swelling.  Gastrointestinal:  Negative for abdominal pain, constipation, diarrhea, nausea and vomiting.  Musculoskeletal:        Left ankle/foot pain  Skin:  Negative for rash.     Past Medical History:  Diagnosis Date   Cellulitis    History of kidney stones    Hypertension    per pt, resolved after weight loss   Past Surgical History:  Procedure Laterality Date   ANKLE ARTHROSCOPY WITH ARTHRODESIS Left 06/25/2021   Procedure: LEFT ANKLE ARTHRODESIS,;  Surgeon: Erle Crocker, MD;  Location: [place];  Service: Orthopedics;  Laterality: Left;   INGUINAL HERNIA REPAIR Right 2008   KNEE ARTHROSCOPY Right 1994   ORIF ANKLE FRACTURE Left 10/02/2020   Procedure: OPEN REDUCTION INTERNAL FIXATION (ORIF) ANKLE FRACTURE;  Surgeon: Nicholes Stairs, MD;  Location: [place];  Service: Orthopedics;  Laterality: Left;   VASECTOMY      Social History   Tobacco Use   Smoking status: Every Day    Packs/day: 1.00    Types: Cigarettes   Smokeless tobacco: Never  Vaping Use   Vaping Use: Some days   Substances: Nicotine  Substance Use Topics   Alcohol use: Yes    Comment: rare   Drug use: Never    Family History  Problem Relation Age of  Onset   Heart murmur Father     Allergies  Allergen Reactions   Tomato Nausea And Vomiting    OBJECTIVE: Blood pressure 138/82, pulse 90, temperature 98.5 F (36.9 C), temperature source Oral, resp. rate 16, height 6' 1"$  (1.854 m), weight 127 kg, SpO2 97 %.  Physical Exam Constitutional:      General: He is not in acute distress.    Appearance: He is well-developed.  Cardiovascular:     Rate and Rhythm: Normal rate and regular rhythm.     Heart sounds: Normal heart sounds.  Pulmonary:     Effort: Pulmonary effort is normal.     Breath sounds: Normal breath sounds.  Musculoskeletal:     Comments: Ace wrap around  left ankle. Very sensitive, warm, red.   Skin:    General: Skin is warm and dry.  Neurological:     Mental Status: He is alert and oriented to person, place, and time.  Psychiatric:        Mood and Affect: Mood normal.     Lab Results Lab Results  Component Value Date   WBC 16.5 (H) 10/31/2022   HGB 14.2 10/31/2022   HCT 41.1 10/31/2022   MCV 89.2 10/31/2022   PLT 174 10/31/2022    Lab Results  Component Value Date   CREATININE 0.94 10/31/2022   BUN 9 10/31/2022   NA 131 (L) 10/31/2022   K 3.9 10/31/2022   CL 98 10/31/2022   CO2 24 10/31/2022    Lab Results  Component Value Date   ALT 25 10/30/2022   AST 22 10/30/2022   ALKPHOS 75 10/30/2022   BILITOT 0.7 10/30/2022     Microbiology: Recent Results (from the past 240 hour(s))  Culture, blood (routine x 2)     Status: Abnormal (Preliminary result)   Collection Time: 10/30/22 10:00 AM   Specimen: BLOOD RIGHT FOREARM  Result Value Ref Range Status   Specimen Description BLOOD RIGHT FOREARM  Final   Special Requests   Final    BOTTLES DRAWN AEROBIC AND ANAEROBIC Blood Culture adequate volume   Culture  Setup Time   Final    GRAM POSITIVE COCCI AEROBIC BOTTLE ONLY CRITICAL RESULT CALLED TO, READ BACK BY AND VERIFIED WITH: T RUDISILL,PHARMD@0152$  10/31/22 [place]    Culture (A)  Final    STREPTOCOCCUS GROUP G CULTURE REINCUBATED FOR BETTER GROWTH Performed at [facility], [place] [address], [place], [place] [address]    Report Status PENDING  Incomplete  Blood Culture ID Panel (Reflexed)     Status: Abnormal   Collection Time: 10/30/22 10:00 AM  Result Value Ref Range Status   Enterococcus faecalis NOT DETECTED NOT DETECTED Final   Enterococcus Faecium NOT DETECTED NOT DETECTED Final   Listeria monocytogenes NOT DETECTED NOT DETECTED Final   Staphylococcus species NOT DETECTED NOT DETECTED Final   Staphylococcus aureus (BCID) NOT DETECTED NOT DETECTED Final   Staphylococcus epidermidis NOT DETECTED NOT DETECTED  Final   Staphylococcus lugdunensis NOT DETECTED NOT DETECTED Final   Streptococcus species DETECTED (A) NOT DETECTED Final    Comment: Not Enterococcus species, Streptococcus agalactiae, Streptococcus pyogenes, or Streptococcus pneumoniae. CRITICAL RESULT CALLED TO, READ BACK BY AND VERIFIED WITH: T RUDISILL,PHARMD@0152$  10/31/22 [place]    Streptococcus agalactiae NOT DETECTED NOT DETECTED Final   Streptococcus pneumoniae NOT DETECTED NOT DETECTED Final   Streptococcus pyogenes NOT DETECTED NOT DETECTED Final   A.calcoaceticus-baumannii NOT DETECTED NOT DETECTED Final   Bacteroides fragilis NOT DETECTED NOT DETECTED  Final   Enterobacterales NOT DETECTED NOT DETECTED Final   Enterobacter cloacae complex NOT DETECTED NOT DETECTED Final   Escherichia coli NOT DETECTED NOT DETECTED Final   Klebsiella aerogenes NOT DETECTED NOT DETECTED Final   Klebsiella oxytoca NOT DETECTED NOT DETECTED Final   Klebsiella pneumoniae NOT DETECTED NOT DETECTED Final   Proteus species NOT DETECTED NOT DETECTED Final   Salmonella species NOT DETECTED NOT DETECTED Final   Serratia marcescens NOT DETECTED NOT DETECTED Final   Haemophilus influenzae NOT DETECTED NOT DETECTED Final   Neisseria meningitidis NOT DETECTED NOT DETECTED Final   Pseudomonas aeruginosa NOT DETECTED NOT DETECTED Final   Stenotrophomonas maltophilia NOT DETECTED NOT DETECTED Final   Candida albicans NOT DETECTED NOT DETECTED Final   Candida auris NOT DETECTED NOT DETECTED Final   Candida glabrata NOT DETECTED NOT DETECTED Final   Candida krusei NOT DETECTED NOT DETECTED Final   Candida parapsilosis NOT DETECTED NOT DETECTED Final   Candida tropicalis NOT DETECTED NOT DETECTED Final   Cryptococcus neoformans/gattii NOT DETECTED NOT DETECTED Final    Comment: Performed at [facility], [place] [address], [place], [place] [address]  Culture, blood (routine x 2)     Status: None (Preliminary result)   Collection Time: 10/30/22 10:07 AM    Specimen: BLOOD LEFT FOREARM  Result Value Ref Range Status   Specimen Description BLOOD LEFT FOREARM  Final   Special Requests   Final    BOTTLES DRAWN AEROBIC AND ANAEROBIC Blood Culture adequate volume   Culture   Final    NO GROWTH < 24 HOURS Performed at [facility], [place] [address], [place], [place] [address]    Report Status PENDING  Incomplete   Imaging VAS [place] ABI WITH/WO TBI  Result Date: 10/31/2022  LOWER EXTREMITY DOPPLER STUDY Patient Name:  SHAQUILLA REESE  Date of Exam:   10/31/2022 Medical Rec #: [id number]           Accession #:    [id number] Date of Birth: 01/02/1976            Patient Gender: M Patient Age:   47 years Exam Location:  [facility] Procedure:      VAS [place] ABI WITH/WO TBI Referring Phys: Anderson Malta YATES --------------------------------------------------------------------------------  Indications: Cellultis w/ ulceration of LLE (ankle) High Risk Factors: Hypertension, current smoker. Other Factors: Left ankle fracture 09/2020 requiring ORIF complicated by                avascular necrosis followed by left ankle arthrodesis (06/2021).  Comparison Study: No previous exams Performing Technologist: Hill, Jody RVT, RDMS  Examination Guidelines: A complete evaluation includes at minimum, Doppler waveform signals and systolic blood pressure reading at the level of bilateral brachial, anterior tibial, and posterior tibial arteries, when vessel segments are accessible. Bilateral testing is considered an integral part of a complete examination. Photoelectric Plethysmograph (PPG) waveforms and toe systolic pressure readings are included as required and additional duplex testing as needed. Limited examinations for reoccurring indications may be performed as noted.  ABI Findings: +---------+------------------+-----+---------+--------+ Right    Rt Pressure (mmHg)IndexWaveform Comment  +---------+------------------+-----+---------+--------+ Brachial 150                     triphasic         +---------+------------------+-----+---------+--------+ PTA      155               1.03 triphasic         +---------+------------------+-----+---------+--------+  DP       167               1.11 triphasic         +---------+------------------+-----+---------+--------+ Great Toe171               1.14 Normal            +---------+------------------+-----+---------+--------+ +---------+------------------+-----+---------+-------+ Left     Lt Pressure (mmHg)IndexWaveform Comment +---------+------------------+-----+---------+-------+ Brachial 148                    triphasic        +---------+------------------+-----+---------+-------+ PTA      164               1.09 triphasic        +---------+------------------+-----+---------+-------+ DP       167               1.11 triphasic        +---------+------------------+-----+---------+-------+ Great Toe107               0.71 Normal           +---------+------------------+-----+---------+-------+ +-------+-----------+-----------+------------+------------+ ABI/TBIToday's ABIToday's TBIPrevious ABIPrevious TBI +-------+-----------+-----------+------------+------------+ Right  1.11       1.14                                +-------+-----------+-----------+------------+------------+ Left   1.11       0.71                                +-------+-----------+-----------+------------+------------+   Summary: Right: Resting right ankle-brachial index is within normal range. The right toe-brachial index is normal. Left: Resting left ankle-brachial index is within normal range. The left toe-brachial index is normal. *See table(s) above for measurements and observations.     Preliminary    DG Tibia/Fibula Left  Result Date: 10/30/2022 CLINICAL DATA:  infection, assess for osteo EXAM: LEFT TIBIA AND FIBULA - 2 VIEW; LEFT FOOT - COMPLETE 3+ VIEW COMPARISON:  CT 05/16/2021 FINDINGS: The proximal  tibia and fibula are unremarkable. Posterior changes of distal tibia fracture ORIF with tibiotalar arthrodesis and syndesmotic fixation, as well as distal fibular intramedullary fixation. Unchanged alignment the distal fibular fracture with healed appearance and persistent cortical step-off at the posterosuperior aspect, similar in appearance to prior ankle CT in September 2022. There appears to be solid arthrodesis of the tibiotalar joint. Intact hardware without evidence of loosening. Dorsal talar spur. There is no bone erosion or frank bony destruction. There is soft tissue swelling of the lower leg, ankle, and foot. Plantar dorsal calcaneal spurring. IMPRESSION: Soft tissue swelling of the left lower leg, foot, and ankle. Intact ankle fixation hardware without evidence of complication. No evidence of acute osseous abnormality. Electronically Signed   By: Maurine Simmering M.D.   On: 10/30/2022 10:05   DG Foot Complete Left  Result Date: 10/30/2022 CLINICAL DATA:  infection, assess for osteo EXAM: LEFT TIBIA AND FIBULA - 2 VIEW; LEFT FOOT - COMPLETE 3+ VIEW COMPARISON:  CT 05/16/2021 FINDINGS: The proximal tibia and fibula are unremarkable. Posterior changes of distal tibia fracture ORIF with tibiotalar arthrodesis and syndesmotic fixation, as well as distal fibular intramedullary fixation. Unchanged alignment the distal fibular fracture with healed appearance and persistent cortical step-off at the posterosuperior aspect, similar in appearance to prior ankle CT in September 2022. There appears  to be solid arthrodesis of the tibiotalar joint. Intact hardware without evidence of loosening. Dorsal talar spur. There is no bone erosion or frank bony destruction. There is soft tissue swelling of the lower leg, ankle, and foot. Plantar dorsal calcaneal spurring. IMPRESSION: Soft tissue swelling of the left lower leg, foot, and ankle. Intact ankle fixation hardware without evidence of complication. No evidence of acute  osseous abnormality. Electronically Signed   By: Maurine Simmering M.D.   On: 10/30/2022 10:05     Terri Piedra, NP [place]  10/31/2022  11:36 AM

## 2022-10-31 NOTE — Progress Notes (Signed)
PHARMACY - PHYSICIAN COMMUNICATION CRITICAL VALUE ALERT - BLOOD CULTURE IDENTIFICATION (BCID)  [name] is an 47 y.o. male who presented to [facility] on 10/30/2022 with a chief complaint of swelling and redness and pain in his left ankle   Assessment:  Streptococcus species 1/4  Name of physician (or Provider) Contacted: Raenette Rover  Current antibiotics: Vancomycin, ceftriaxone, and flagyl  Changes to prescribed antibiotics recommended:  Patient is on recommended antibiotics - No changes needed - could consider stopping flagyl  Results for orders placed or performed during the hospital encounter of 10/30/22  Blood Culture ID Panel (Reflexed) (Collected: 10/30/2022 10:00 AM)  Result Value Ref Range   Enterococcus faecalis NOT DETECTED NOT DETECTED   Enterococcus Faecium NOT DETECTED NOT DETECTED   Listeria monocytogenes NOT DETECTED NOT DETECTED   Staphylococcus species NOT DETECTED NOT DETECTED   Staphylococcus aureus (BCID) NOT DETECTED NOT DETECTED   Staphylococcus epidermidis NOT DETECTED NOT DETECTED   Staphylococcus lugdunensis NOT DETECTED NOT DETECTED   Streptococcus species DETECTED (A) NOT DETECTED   Streptococcus agalactiae NOT DETECTED NOT DETECTED   Streptococcus pneumoniae NOT DETECTED NOT DETECTED   Streptococcus pyogenes NOT DETECTED NOT DETECTED   A.calcoaceticus-baumannii NOT DETECTED NOT DETECTED   Bacteroides fragilis NOT DETECTED NOT DETECTED   Enterobacterales NOT DETECTED NOT DETECTED   Enterobacter cloacae complex NOT DETECTED NOT DETECTED   Escherichia coli NOT DETECTED NOT DETECTED   Klebsiella aerogenes NOT DETECTED NOT DETECTED   Klebsiella oxytoca NOT DETECTED NOT DETECTED   Klebsiella pneumoniae NOT DETECTED NOT DETECTED   Proteus species NOT DETECTED NOT DETECTED   Salmonella species NOT DETECTED NOT DETECTED   Serratia marcescens NOT DETECTED NOT DETECTED   Haemophilus influenzae NOT DETECTED NOT DETECTED   Neisseria meningitidis NOT  DETECTED NOT DETECTED   Pseudomonas aeruginosa NOT DETECTED NOT DETECTED   Stenotrophomonas maltophilia NOT DETECTED NOT DETECTED   Candida albicans NOT DETECTED NOT DETECTED   Candida auris NOT DETECTED NOT DETECTED   Candida glabrata NOT DETECTED NOT DETECTED   Candida krusei NOT DETECTED NOT DETECTED   Candida parapsilosis NOT DETECTED NOT DETECTED   Candida tropicalis NOT DETECTED NOT DETECTED   Cryptococcus neoformans/gattii NOT DETECTED NOT DETECTED    Nicole Kindred L Hayley Horn 10/31/2022  2:25 AM

## 2022-10-31 NOTE — Progress Notes (Signed)
PROGRESS NOTE    [name]  [id number] DOB: [birth year] DOA: 10/30/2022 PCP: Patient, No Pcp Per    Brief Narrative:   [name] is a 47 y.o. 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 Lucia Gaskins 2022 who presented to [facility] ED with persistent erythema, swelling, and pain to his left lower extremity.  Patient has been treated for cellulitis outpatient with a course of doxycycline and Keflex without improvement.  Also reports now ulceration to ankle.  Denies history of diabetes.  In the ED, temperature 99.0 F, HR 105, RR 20, BP 140/70, SpO2 96% on room air.  WBC 13.2, hemoglobin 16.4, platelets 210.  Sodium 137, potassium 3.9, chloride 104, CO2 24, glucose 130, BUN 15, creatinine 1.02.  AST 22, ALT 25, total bilirubin 0.7.  Lactic acid 1.6.  X-ray left foot/tibia/fibula with soft tissue swelling left lower leg, foot and ankle, intact ankle fixation hardware without evidence of complication, no evidence of acute osseous abnormality.  Blood cultures x 2 obtained.  Orthopedics consulted.  Patient was started on IV vancomycin/ceftriaxone/Flagyl.  TRH consulted for further evaluation management of persistent right lower extremity cellulitis with concern for possible deep infection with failed outpatient antibiotic treatment.  Assessment & Plan:   Sepsis, POA Streptococcus septicemia Right lower extremity cellulitis Patient presenting to ED with persistent erythema, edema, pain to right lower extremity despite course of outpatient Keflex and doxycycline.  Complicated by his history of left ankle fracture s/p arthrocentesis 2022 by Dr. Lucia Gaskins.  Patient with tachycardia, elevated WBC count of 13.2 on admission.  Lactic acid 1.2.  Hemoglobin A1c 5.8. -- Orthopedics, infectious disease following, appreciate  assistance -- WBC 13.2>16.5 -- CRP 6.8 -- MR left ankle with and without contrast: Pending -- Ultrasound ABI: Pending -- Blood cultures 1/4: + GPC's, BCID + Streptococcus, further pending -- Linezolid 600 mg IV every 12 hours -- Ceftriaxone 2 g IV every 24 hours -- Metronidazole 500 mg IV every 12 hours -- Oxycodone 5-10 mg PO q4h PRN moderate pain -- Dilaudid 0.5 mg IV every 2 hours as needed severe pain -- LR at 75 mL/h -- CBC daily  Hx essential hypertension Currently not on home Antepar tensive therapy. -- Hydralazine 52m IV q4h PRN SBP >180 -- Continue monitor BP closely  Tobacco use disorder Counseled on need for complete cessation/abstinence. -- Nicotine patch  Obesity Body mass index is 36.94 kg/m.  Discussed with patient needs for aggressive lifestyle changes/weight loss as this complicates all facets of care.  Outpatient follow-up with PCP.  May benefit from bariatric evaluation outpatient.   DVT prophylaxis: heparin injection 5,000 Units Start: 10/30/22 1400    Code Status: Full Code Family Communication:   Disposition Plan:  Level of care: Med-Surg Status is: Inpatient Remains inpatient appropriate because: IV antibiotics, pending further workup for concern of deep infection left lower extremity, pending MR/ultrasound ABI, further per ID/orthopedics    Consultants:  Infectious disease Orthopedic surgery  Procedures:  None  Antimicrobials:  Vancomycin 2/15 - 2/15 Cefepime 2/15 - 2/15 Linezolid 2/16>> Ceftriaxone 2/15>> Metronidazole 2/15>>   Subjective: Patient seen examined bedside, resting comfortably.  Complains of pain to left ankle.  Reports pain medication effective.  Seen by orthopedics this morning and infectious disease.  Pending MR left ankle.  Continues on IV antibiotics with linezolid/ceftriaxone/metronidazole.  Tmax overnight 101.9.  Blood culture 1 out  of 4 now positive for Streptococcus per [facility] ID.  No other specific complaints or  concerns at this time.  Denies headache, no dizziness, no chest pain, no palpitations, no current fever, no chills/night sweats, no nausea/vomiting/diarrhea, no focal weakness, no fatigue, no paresthesias.  No acute events overnight per nursing staff.  Objective: Vitals:   10/30/22 2139 10/31/22 0126 10/31/22 0408 10/31/22 0740  BP: (!) 147/75 137/77 (!) 140/75 138/82  Pulse: (!) 103 97 94 90  Resp: 18 18 18 16  $ Temp: 99.3 F (37.4 C) 99.4 F (37.4 C) 98.6 F (37 C) 98.5 F (36.9 C)  TempSrc: Oral Oral Oral Oral  SpO2: 93% 94% 97% 97%  Weight:      Height:        Intake/Output Summary (Last 24 hours) at 10/31/2022 1234 Last data filed at 10/31/2022 1100 Gross per 24 hour  Intake 731.92 ml  Output 2325 ml  Net -1593.08 ml   Filed Weights   10/30/22 0903  Weight: 127 kg    Examination:  Physical Exam: GEN: NAD, alert and oriented x 3, obese HEENT: NCAT, PERRL, EOMI, sclera clear, MMM PULM: CTAB w/o wheezes/crackles, normal respiratory effort, on room air CV: RRR w/o M/G/R GI: abd soft, NTND, NABS, no R/G/M MSK: Noted edema, erythema and TTP to palpation in the left ankle/lower left leg, Ace wrap in place, moves all extremities independently NEURO: CN II-XII intact, no focal deficits, sensation to light touch intact PSYCH: normal mood/affect Integumentary: Edema, erythema noted to left lower extremity as depicted below, otherwise no other concerning rashes/lesions/wounds noted on exposed skin surfaces.           Data Reviewed: I have personally reviewed following labs and imaging studies  CBC: Recent Labs  Lab 10/30/22 0911 10/31/22 0411  WBC 13.2* 16.5*  NEUTROABS 10.4*  --   HGB 16.4 14.2  HCT 46.6 41.1  MCV 88.4 89.2  PLT 210 [id number]   Basic Metabolic Panel: Recent Labs  Lab 10/30/22 0911 10/31/22 0411  NA 137 131*  K 3.9 3.9  CL 104 98  CO2 24 24  GLUCOSE 130* 115*  BUN 15 9  CREATININE 1.02 0.94  CALCIUM 9.1 8.2*   GFR: Estimated Creatinine  Clearance: 134.2 mL/min (by C-G formula based on SCr of 0.94 mg/dL). Liver Function Tests: Recent Labs  Lab 10/30/22 0911  AST 22  ALT 25  ALKPHOS 75  BILITOT 0.7  PROT 7.6  ALBUMIN 3.7   No results for input(s): "LIPASE", "AMYLASE" in the last 168 hours. No results for input(s): "AMMONIA" in the last 168 hours. Coagulation Profile: No results for input(s): "INR", "PROTIME" in the last 168 hours. Cardiac Enzymes: No results for input(s): "CKTOTAL", "CKMB", "CKMBINDEX", "TROPONINI" in the last 168 hours. BNP (last 3 results) No results for input(s): "PROBNP" in the last 8760 hours. HbA1C: Recent Labs    10/30/22 1539  HGBA1C 5.5   CBG: No results for input(s): "GLUCAP" in the last 168 hours. Lipid Profile: No results for input(s): "CHOL", "HDL", "LDLCALC", "TRIG", "CHOLHDL", "LDLDIRECT" in the last 72 hours. Thyroid Function Tests: No results for input(s): "TSH", "T4TOTAL", "FREET4", "T3FREE", "THYROIDAB" in the last 72 hours. Anemia Panel: No results for input(s): "VITAMINB12", "FOLATE", "FERRITIN", "TIBC", "IRON", "RETICCTPCT" in the last 72 hours. Sepsis Labs: Recent Labs  Lab 10/30/22 0911 10/30/22 1145  LATICACIDVEN 1.6 1.2    Recent Results (from the past 240 hour(s))  Culture, blood (routine x 2)     Status: Abnormal (Preliminary result)  Collection Time: 10/30/22 10:00 AM   Specimen: BLOOD RIGHT FOREARM  Result Value Ref Range Status   Specimen Description BLOOD RIGHT FOREARM  Final   Special Requests   Final    BOTTLES DRAWN AEROBIC AND ANAEROBIC Blood Culture adequate volume   Culture  Setup Time   Final    GRAM POSITIVE COCCI AEROBIC BOTTLE ONLY CRITICAL RESULT CALLED TO, READ BACK BY AND VERIFIED WITH: T RUDISILL,PHARMD@0152$  10/31/22 [place]    Culture (A)  Final    STREPTOCOCCUS GROUP G CULTURE REINCUBATED FOR BETTER GROWTH Performed at [facility], [place] [address], [place], [place] [address]    Report Status PENDING  Incomplete  Blood  Culture ID Panel (Reflexed)     Status: Abnormal   Collection Time: 10/30/22 10:00 AM  Result Value Ref Range Status   Enterococcus faecalis NOT DETECTED NOT DETECTED Final   Enterococcus Faecium NOT DETECTED NOT DETECTED Final   Listeria monocytogenes NOT DETECTED NOT DETECTED Final   Staphylococcus species NOT DETECTED NOT DETECTED Final   Staphylococcus aureus (BCID) NOT DETECTED NOT DETECTED Final   Staphylococcus epidermidis NOT DETECTED NOT DETECTED Final   Staphylococcus lugdunensis NOT DETECTED NOT DETECTED Final   Streptococcus species DETECTED (A) NOT DETECTED Final    Comment: Not Enterococcus species, Streptococcus agalactiae, Streptococcus pyogenes, or Streptococcus pneumoniae. CRITICAL RESULT CALLED TO, READ BACK BY AND VERIFIED WITH: T RUDISILL,PHARMD@0152$  10/31/22 [place]    Streptococcus agalactiae NOT DETECTED NOT DETECTED Final   Streptococcus pneumoniae NOT DETECTED NOT DETECTED Final   Streptococcus pyogenes NOT DETECTED NOT DETECTED Final   A.calcoaceticus-baumannii NOT DETECTED NOT DETECTED Final   Bacteroides fragilis NOT DETECTED NOT DETECTED Final   Enterobacterales NOT DETECTED NOT DETECTED Final   Enterobacter cloacae complex NOT DETECTED NOT DETECTED Final   Escherichia coli NOT DETECTED NOT DETECTED Final   Klebsiella aerogenes NOT DETECTED NOT DETECTED Final   Klebsiella oxytoca NOT DETECTED NOT DETECTED Final   Klebsiella pneumoniae NOT DETECTED NOT DETECTED Final   Proteus species NOT DETECTED NOT DETECTED Final   Salmonella species NOT DETECTED NOT DETECTED Final   Serratia marcescens NOT DETECTED NOT DETECTED Final   Haemophilus influenzae NOT DETECTED NOT DETECTED Final   Neisseria meningitidis NOT DETECTED NOT DETECTED Final   Pseudomonas aeruginosa NOT DETECTED NOT DETECTED Final   Stenotrophomonas maltophilia NOT DETECTED NOT DETECTED Final   Candida albicans NOT DETECTED NOT DETECTED Final   Candida auris NOT DETECTED NOT DETECTED Final   Candida  glabrata NOT DETECTED NOT DETECTED Final   Candida krusei NOT DETECTED NOT DETECTED Final   Candida parapsilosis NOT DETECTED NOT DETECTED Final   Candida tropicalis NOT DETECTED NOT DETECTED Final   Cryptococcus neoformans/gattii NOT DETECTED NOT DETECTED Final    Comment: Performed at [facility], [address], [place], [place] [address]  Culture, blood (routine x 2)     Status: None (Preliminary result)   Collection Time: 10/30/22 10:07 AM   Specimen: BLOOD LEFT FOREARM  Result Value Ref Range Status   Specimen Description BLOOD LEFT FOREARM  Final   Special Requests   Final    BOTTLES DRAWN AEROBIC AND ANAEROBIC Blood Culture adequate volume   Culture   Final    NO GROWTH < 24 HOURS Performed at [facility], [place] [address] [place], [place] [address]    Report Status PENDING  Incomplete         Radiology Studies: DG Tibia/Fibula Left  Result Date: 10/30/2022 CLINICAL  DATA:  infection, assess for osteo EXAM: LEFT TIBIA AND FIBULA - 2 VIEW; LEFT FOOT - COMPLETE 3+ VIEW COMPARISON:  CT 05/16/2021 FINDINGS: The proximal tibia and fibula are unremarkable. Posterior changes of distal tibia fracture ORIF with tibiotalar arthrodesis and syndesmotic fixation, as well as distal fibular intramedullary fixation. Unchanged alignment the distal fibular fracture with healed appearance and persistent cortical step-off at the posterosuperior aspect, similar in appearance to prior ankle CT in September 2022. There appears to be solid arthrodesis of the tibiotalar joint. Intact hardware without evidence of loosening. Dorsal talar spur. There is no bone erosion or frank bony destruction. There is soft tissue swelling of the lower leg, ankle, and foot. Plantar dorsal calcaneal spurring. IMPRESSION: Soft tissue swelling of the left lower leg, foot, and ankle. Intact ankle fixation hardware without evidence of complication. No evidence of acute osseous abnormality. Electronically Signed   By:  Maurine Simmering M.D.   On: 10/30/2022 10:05   DG Foot Complete Left  Result Date: 10/30/2022 CLINICAL DATA:  infection, assess for osteo EXAM: LEFT TIBIA AND FIBULA - 2 VIEW; LEFT FOOT - COMPLETE 3+ VIEW COMPARISON:  CT 05/16/2021 FINDINGS: The proximal tibia and fibula are unremarkable. Posterior changes of distal tibia fracture ORIF with tibiotalar arthrodesis and syndesmotic fixation, as well as distal fibular intramedullary fixation. Unchanged alignment the distal fibular fracture with healed appearance and persistent cortical step-off at the posterosuperior aspect, similar in appearance to prior ankle CT in September 2022. There appears to be solid arthrodesis of the tibiotalar joint. Intact hardware without evidence of loosening. Dorsal talar spur. There is no bone erosion or frank bony destruction. There is soft tissue swelling of the lower leg, ankle, and foot. Plantar dorsal calcaneal spurring. IMPRESSION: Soft tissue swelling of the left lower leg, foot, and ankle. Intact ankle fixation hardware without evidence of complication. No evidence of acute osseous abnormality. Electronically Signed   By: Maurine Simmering M.D.   On: 10/30/2022 10:05        Scheduled Meds:  docusate sodium  100 mg Oral BID   heparin  5,000 Units Subcutaneous Q8H   nicotine  14 mg Transdermal Daily   Continuous Infusions:  sodium chloride 75 mL/hr at 10/31/22 0455   cefTRIAXone (ROCEPHIN)  IV 2 g (10/30/22 1832)   linezolid (ZYVOX) IV     metronidazole 500 mg (10/31/22 0131)     LOS: 1 day    Time spent: 52 minutes spent on chart review, discussion with nursing staff, consultants, updating family and interview/physical exam; more than 50% of that time was spent in counseling and/or coordination of care.    Anahis Furgeson J [place] DO [facility] Available via Epic secure chat 7am-7pm After these hours, please refer to coverage provider listed on [email or web address] 10/31/2022, 12:34 PM

## 2022-10-31 NOTE — Consult Note (Signed)
I have seen and examined the patient. I have personally reviewed the clinical findings, laboratory findings, microbiological data and imaging studies. The assessment and treatment plan was discussed with the Nurse Practitioner Mauricio Po. I agree with her/his recommendations except following additions/corrections.  47 year old male with PMH of hypertension, kidney stones, prior left tri-malleolar # s/p ORIF (10/02/2020) with secondary fall causing opening of wounds s/p 2 courses of cephalexin when seen by wound care [id number] then complicated with avascular necrosis s/p left ankle arthrodesis (06/25/2021, OR cx no growth) who presented to the ED 2/15 for left leg redness, swelling and warmth. Previously seen at the [facility] 1/15  and had ultrasound of leg done and was given course of cephalexin. Seen in the ED 1/22 for worsening cellulitis and left lower leg and was given a 2 weeks course of doxycycline.    Exam - left leg and ankle diffusely swollen, warm and tender. Small draining wound in the medial ankle. No purulent drainage or crepitus or fluctuance. Calves soft. Able to wiggle toes. No signs of infection in the left knee.   Labs/imaging and microbiologic data reviewed  Will switch antibiotics to linezolid for antitoxin effect given excruciating pain in the left leg pending MRI and surgical plans, continue ceftriaxone  Repeat 2 sets of blood cx tomorrow, ordered.  Bacteremia source is likely Left leg and low suspicion for endocarditis  Monitor CBC, BMP and cultures  Dr Baxter Flattery covering this weekend, new ID team to follow starting Monday.    Rosiland Oz, MD Infectious Disease Physician [facility] [address] [place], [place] [address] Phone: [phone]  Fax: [place] [facility]    Date of Admission:  10/30/2022     Total days of antibiotics 2               Reason for Consult: Cellulitis    Referring Provider: Dr. [place] Primary Care Provider: Patient, No Pcp Per   ASSESSMENT:  [name] is a 47 y/o male who sustained a traumatic fall in January 2022 resulting in trimalleolar fracture requiring ORIF with course complicated by development of avascular necrosis requiring ankle arthrodesis and now admitted with cellulitis and concern for deeper infection despite outpatient antibiotic therapy. Blood cultures are also positive for Group G Streptococcus in 1/4 bottles. MRI pending and anticipate will require some surgical intervention. Narrow antibiotics to ceftriaxone and will add linezolid for anti-toxin effects although risk of necrotizing is low with Group G Streptococcus. Certainly at risk for complicated healing and further infection given tobacco use. Continue pain management and remaining medical and supportive care per Primary Team.   PLAN:  Continue ceftriaxone and change vancomycin to Zyvox for anti-toxin effect.  Await MRI results for any needed surgical plan.  Therapeutic drug monitoring of platelets while on Zyvox.  Remaining medical and supportive care per Internal Medicine.  Dr. Baxter Flattery is available over the weekend for ID related questions.    Principal Problem:   Cellulitis and abscess of leg Active Problems:   Essential hypertension   Tobacco dependence   Bacteremia due to Streptococcus    docusate sodium  100 mg Oral BID   heparin  5,000 Units Subcutaneous Q8H   nicotine  14 mg Transdermal Daily     HPI: [name] is a 47 y.o. male with previous history of hypertension presenting to the ED with left leg lower extremity redness and swelling.   [name]  initially sustained a fall in January 2022 resulting in a left ankle dislocation and trimalleolar fracture requiring ORIF performed on 10/02/20. Was not evaluated by Orthopedics prior to discharge and left AMA. Seen in wound care and per notes had a secondary fall that resulted in 3  separate areas opening up and was placed on 2 separate courses of Keflex. Last seen by wound care on 01/16/21 with the left lateral ankle incision and small areas anteriorly all closed. 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 Dr. Dorian Heckle performed left ankle arthrodesis on 06/25/21. Tissue samples from surgery were without growth.   [name] presented to the Urgent Care on 09/29/22 with swelling and redness of the left foot and ankle. He did have fever/chills about 4 days prior to presentation when the symptoms began. LE venous doppler completed with no evidence of DVT. Prescribed 7 days of Cephalexin 500 mg qid. Seen in the ED on 1/22/4 with initially some improvement and worsening after stopping the Cephalexin. He was discharged with2 week course of doxycycline.  [name] completed the course of doxycycline and now presented to the ED with persistent and worsening redness and swelling of the left ankle. There was little to no improvement when taking doxycycline. Had a new ulceration to the medial malleolus with clear drainage. X-rays of the tibia/fibula and foot with soft tissue swelling of the left lower leg, foot and ankle with intact ankle fixation hardware without evidence of complication or osteomyelitis. Orthopedics consulted and performing additional imaging with concern for deep infection with possible need for removal of hardware.  MRI is currently pending.  Febrile with temperature of 101.9 F with leukocytosis and WBC count of 16.5. Blood cultures are positive in 1/4 bottles for Group G Streptococcus identified on BCID. Started on broad spectrum antibiotics with vancomycin, cefepime and metronidazole. Cefepime now narrowed to Ceftriaxone.    Review of Systems: Review of Systems  Constitutional:  Negative for chills, fever and weight loss.  Respiratory:  Negative for cough, shortness of breath  and wheezing.   Cardiovascular:  Negative for chest pain and leg swelling.  Gastrointestinal:  Negative for abdominal pain, constipation, diarrhea, nausea and vomiting.  Musculoskeletal:        Left ankle/foot pain  Skin:  Negative for rash.     Past Medical History:  Diagnosis Date   Cellulitis    History of kidney stones    Hypertension    per pt, resolved after weight loss   Past Surgical History:  Procedure Laterality Date   ANKLE ARTHROSCOPY WITH ARTHRODESIS Left 06/25/2021   Procedure: LEFT ANKLE ARTHRODESIS,;  Surgeon: Erle Crocker, MD;  Location: [place];  Service: Orthopedics;  Laterality: Left;   INGUINAL HERNIA REPAIR Right 2008   KNEE ARTHROSCOPY Right 1994   ORIF ANKLE FRACTURE Left 10/02/2020   Procedure: OPEN REDUCTION INTERNAL FIXATION (ORIF) ANKLE FRACTURE;  Surgeon: Nicholes Stairs, MD;  Location: [place];  Service: Orthopedics;  Laterality: Left;   VASECTOMY      Social History   Tobacco Use   Smoking status: Every Day    Packs/day: 1.00    Types: Cigarettes   Smokeless tobacco: Never  Vaping Use   Vaping Use: Some days   Substances: Nicotine  Substance Use Topics   Alcohol use: Yes    Comment: rare   Drug use: Never    Family History  Problem Relation Age of Onset  Heart murmur Father     Allergies  Allergen Reactions   Tomato Nausea And Vomiting    OBJECTIVE: Blood pressure 138/82, pulse 90, temperature 98.5 F (36.9 C), temperature source Oral, resp. rate 16, height 6' 1"$  (1.854 m), weight 127 kg, SpO2 97 %.  Physical Exam Constitutional:      General: He is not in acute distress.    Appearance: He is well-developed.  Cardiovascular:     Rate and Rhythm: Normal rate and regular rhythm.     Heart sounds: Normal heart sounds.  Pulmonary:     Effort: Pulmonary effort is normal.     Breath sounds: Normal breath sounds.  Musculoskeletal:     Comments: Ace wrap around left ankle. Very sensitive, warm, red.   Skin:     General: Skin is warm and dry.  Neurological:     Mental Status: He is alert and oriented to person, place, and time.  Psychiatric:        Mood and Affect: Mood normal.          Lab Results Lab Results  Component Value Date   WBC 16.5 (H) 10/31/2022   HGB 14.2 10/31/2022   HCT 41.1 10/31/2022   MCV 89.2 10/31/2022   PLT 174 10/31/2022    Lab Results  Component Value Date   CREATININE 0.94 10/31/2022   BUN 9 10/31/2022   NA 131 (L) 10/31/2022   K 3.9 10/31/2022   CL 98 10/31/2022   CO2 24 10/31/2022    Lab Results  Component Value Date   ALT 25 10/30/2022   AST 22 10/30/2022   ALKPHOS 75 10/30/2022   BILITOT 0.7 10/30/2022     Microbiology: Recent Results (from the past 240 hour(s))  Culture, blood (routine x 2)     Status: Abnormal (Preliminary result)   Collection Time: 10/30/22 10:00 AM   Specimen: BLOOD RIGHT FOREARM  Result Value Ref Range Status   Specimen Description BLOOD RIGHT FOREARM  Final   Special Requests   Final    BOTTLES DRAWN AEROBIC AND ANAEROBIC Blood Culture adequate volume   Culture  Setup Time   Final    GRAM POSITIVE COCCI AEROBIC BOTTLE ONLY CRITICAL RESULT CALLED TO, READ BACK BY AND VERIFIED WITH: T RUDISILL,PHARMD@0152$  10/31/22 [place]    Culture (A)  Final    STREPTOCOCCUS GROUP G CULTURE REINCUBATED FOR BETTER GROWTH Performed at [facility], [place] [address], [place], [place] [address]    Report Status PENDING  Incomplete  Blood Culture ID Panel (Reflexed)     Status: Abnormal   Collection Time: 10/30/22 10:00 AM  Result Value Ref Range Status   Enterococcus faecalis NOT DETECTED NOT DETECTED Final   Enterococcus Faecium NOT DETECTED NOT DETECTED Final   Listeria monocytogenes NOT DETECTED NOT DETECTED Final   Staphylococcus species NOT DETECTED NOT DETECTED Final   Staphylococcus aureus (BCID) NOT DETECTED NOT DETECTED Final   Staphylococcus epidermidis NOT DETECTED NOT DETECTED Final   Staphylococcus lugdunensis NOT  DETECTED NOT DETECTED Final   Streptococcus species DETECTED (A) NOT DETECTED Final    Comment: Not Enterococcus species, Streptococcus agalactiae, Streptococcus pyogenes, or Streptococcus pneumoniae. CRITICAL RESULT CALLED TO, READ BACK BY AND VERIFIED WITH: T RUDISILL,PHARMD@0152$  10/31/22 [place]    Streptococcus agalactiae NOT DETECTED NOT DETECTED Final   Streptococcus pneumoniae NOT DETECTED NOT DETECTED Final   Streptococcus pyogenes NOT DETECTED NOT DETECTED Final   A.calcoaceticus-baumannii NOT DETECTED NOT DETECTED Final   Bacteroides fragilis NOT DETECTED  NOT DETECTED Final   Enterobacterales NOT DETECTED NOT DETECTED Final   Enterobacter cloacae complex NOT DETECTED NOT DETECTED Final   Escherichia coli NOT DETECTED NOT DETECTED Final   Klebsiella aerogenes NOT DETECTED NOT DETECTED Final   Klebsiella oxytoca NOT DETECTED NOT DETECTED Final   Klebsiella pneumoniae NOT DETECTED NOT DETECTED Final   Proteus species NOT DETECTED NOT DETECTED Final   Salmonella species NOT DETECTED NOT DETECTED Final   Serratia marcescens NOT DETECTED NOT DETECTED Final   Haemophilus influenzae NOT DETECTED NOT DETECTED Final   Neisseria meningitidis NOT DETECTED NOT DETECTED Final   Pseudomonas aeruginosa NOT DETECTED NOT DETECTED Final   Stenotrophomonas maltophilia NOT DETECTED NOT DETECTED Final   Candida albicans NOT DETECTED NOT DETECTED Final   Candida auris NOT DETECTED NOT DETECTED Final   Candida glabrata NOT DETECTED NOT DETECTED Final   Candida krusei NOT DETECTED NOT DETECTED Final   Candida parapsilosis NOT DETECTED NOT DETECTED Final   Candida tropicalis NOT DETECTED NOT DETECTED Final   Cryptococcus neoformans/gattii NOT DETECTED NOT DETECTED Final    Comment: Performed at [facility], [place] [address] [place], [place] [address]  Culture, blood (routine x 2)     Status: None (Preliminary result)   Collection Time: 10/30/22 10:07 AM   Specimen: BLOOD LEFT FOREARM  Result  Value Ref Range Status   Specimen Description BLOOD LEFT FOREARM  Final   Special Requests   Final    BOTTLES DRAWN AEROBIC AND ANAEROBIC Blood Culture adequate volume   Culture   Final    NO GROWTH < 24 HOURS Performed at [facility], [place] [address], [place], [place] [address]    Report Status PENDING  Incomplete   Imaging VAS [place] ABI WITH/WO TBI  Result Date: 10/31/2022  LOWER EXTREMITY DOPPLER STUDY Patient Name:  [name]  Date of Exam:   10/31/2022 Medical Rec #: [id number]           Accession #:    [id number] Date of Birth: 1976-09-01            Patient Gender: M Patient Age:   65 years Exam Location:  [facility] Procedure:      VAS [place] ABI WITH/WO TBI Referring Phys: Anderson Malta YATES --------------------------------------------------------------------------------  Indications: Cellultis w/ ulceration of LLE (ankle) High Risk Factors: Hypertension, current smoker. Other Factors: Left ankle fracture 09/2020 requiring ORIF complicated by                avascular necrosis followed by left ankle arthrodesis (06/2021).  Comparison Study: No previous exams Performing Technologist: Hill, Jody RVT, RDMS  Examination Guidelines: A complete evaluation includes at minimum, Doppler waveform signals and systolic blood pressure reading at the level of bilateral brachial, anterior tibial, and posterior tibial arteries, when vessel segments are accessible. Bilateral testing is considered an integral part of a complete examination. Photoelectric Plethysmograph (PPG) waveforms and toe systolic pressure readings are included as required and additional duplex testing as needed. Limited examinations for reoccurring indications may be performed as noted.  ABI Findings: +---------+------------------+-----+---------+--------+ Right    Rt Pressure (mmHg)IndexWaveform Comment  +---------+------------------+-----+---------+--------+ Brachial 150                    triphasic          +---------+------------------+-----+---------+--------+ PTA      155               1.03 triphasic         +---------+------------------+-----+---------+--------+  DP       167               1.11 triphasic         +---------+------------------+-----+---------+--------+ Great Toe171               1.14 Normal            +---------+------------------+-----+---------+--------+ +---------+------------------+-----+---------+-------+ Left     Lt Pressure (mmHg)IndexWaveform Comment +---------+------------------+-----+---------+-------+ Brachial 148                    triphasic        +---------+------------------+-----+---------+-------+ PTA      164               1.09 triphasic        +---------+------------------+-----+---------+-------+ DP       167               1.11 triphasic        +---------+------------------+-----+---------+-------+ Great Toe107               0.71 Normal           +---------+------------------+-----+---------+-------+ +-------+-----------+-----------+------------+------------+ ABI/TBIToday's ABIToday's TBIPrevious ABIPrevious TBI +-------+-----------+-----------+------------+------------+ Right  1.11       1.14                                +-------+-----------+-----------+------------+------------+ Left   1.11       0.71                                +-------+-----------+-----------+------------+------------+   Summary: Right: Resting right ankle-brachial index is within normal range. The right toe-brachial index is normal. Left: Resting left ankle-brachial index is within normal range. The left toe-brachial index is normal. *See table(s) above for measurements and observations.     Preliminary    DG Tibia/Fibula Left  Result Date: 10/30/2022 CLINICAL DATA:  infection, assess for osteo EXAM: LEFT TIBIA AND FIBULA - 2 VIEW; LEFT FOOT - COMPLETE 3+ VIEW COMPARISON:  CT 05/16/2021 FINDINGS: The proximal tibia and fibula are  unremarkable. Posterior changes of distal tibia fracture ORIF with tibiotalar arthrodesis and syndesmotic fixation, as well as distal fibular intramedullary fixation. Unchanged alignment the distal fibular fracture with healed appearance and persistent cortical step-off at the posterosuperior aspect, similar in appearance to prior ankle CT in September 2022. There appears to be solid arthrodesis of the tibiotalar joint. Intact hardware without evidence of loosening. Dorsal talar spur. There is no bone erosion or frank bony destruction. There is soft tissue swelling of the lower leg, ankle, and foot. Plantar dorsal calcaneal spurring. IMPRESSION: Soft tissue swelling of the left lower leg, foot, and ankle. Intact ankle fixation hardware without evidence of complication. No evidence of acute osseous abnormality. Electronically Signed   By: Maurine Simmering M.D.   On: 10/30/2022 10:05   DG Foot Complete Left  Result Date: 10/30/2022 CLINICAL DATA:  infection, assess for osteo EXAM: LEFT TIBIA AND FIBULA - 2 VIEW; LEFT FOOT - COMPLETE 3+ VIEW COMPARISON:  CT 05/16/2021 FINDINGS: The proximal tibia and fibula are unremarkable. Posterior changes of distal tibia fracture ORIF with tibiotalar arthrodesis and syndesmotic fixation, as well as distal fibular intramedullary fixation. Unchanged alignment the distal fibular fracture with healed appearance and persistent cortical step-off at the posterosuperior aspect, similar in appearance to prior ankle CT in September 2022. There appears  to be solid arthrodesis of the tibiotalar joint. Intact hardware without evidence of loosening. Dorsal talar spur. There is no bone erosion or frank bony destruction. There is soft tissue swelling of the lower leg, ankle, and foot. Plantar dorsal calcaneal spurring. IMPRESSION: Soft tissue swelling of the left lower leg, foot, and ankle. Intact ankle fixation hardware without evidence of complication. No evidence of acute osseous abnormality.  Electronically Signed   By: Maurine Simmering M.D.   On: 10/30/2022 10:05     Terri Piedra, NP [facility]  10/31/2022  3:06 PM

## 2022-10-31 NOTE — Progress Notes (Signed)
ABI has been completed.   Results can be found under chart review under CV PROC. 10/31/2022 2:32 PM Kristofor Michalowski RVT, RDMS

## 2022-11-01 DIAGNOSIS — L03119 Cellulitis of unspecified part of limb: Secondary | ICD-10-CM | POA: Diagnosis not present

## 2022-11-01 DIAGNOSIS — L02419 Cutaneous abscess of limb, unspecified: Secondary | ICD-10-CM | POA: Diagnosis not present

## 2022-11-01 LAB — CBC
HCT: 39.3 % (ref 39.0–52.0)
Hemoglobin: 13.8 g/dL (ref 13.0–17.0)
MCH: 30.9 pg (ref 26.0–34.0)
MCHC: 35.1 g/dL (ref 30.0–36.0)
MCV: 88.1 fL (ref 80.0–100.0)
Platelets: 175 10*3/uL (ref 150–400)
RBC: 4.46 MIL/uL (ref 4.22–5.81)
RDW: 12.7 % (ref 11.5–15.5)
WBC: 15.7 10*3/uL — ABNORMAL HIGH (ref 4.0–10.5)
nRBC: 0 % (ref 0.0–0.2)

## 2022-11-01 LAB — BASIC METABOLIC PANEL
Anion gap: 8 (ref 5–15)
BUN: 9 mg/dL (ref 6–20)
CO2: 23 mmol/L (ref 22–32)
Calcium: 8.2 mg/dL — ABNORMAL LOW (ref 8.9–10.3)
Chloride: 98 mmol/L (ref 98–111)
Creatinine, Ser: 0.86 mg/dL (ref 0.61–1.24)
GFR, Estimated: >60 mL/min (ref >60)
Glucose, Bld: 103 mg/dL — ABNORMAL HIGH (ref 70–99)
Potassium: 3.8 mmol/L (ref 3.5–5.1)
Sodium: 129 mmol/L — ABNORMAL LOW (ref 135–145)

## 2022-11-01 NOTE — Progress Notes (Signed)
PROGRESS NOTE    [name]  [id number] DOB: Sep 06, 1976 DOA: 10/30/2022 PCP: Patient, No Pcp Per    Brief Narrative:   [name] is a 47 y.o. 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 Lucia Gaskins 2022 who presented to [facility] ED with persistent erythema, swelling, and pain to his left lower extremity.  Patient has been treated for cellulitis outpatient with a course of doxycycline and Keflex without improvement.  Also reports now ulceration to ankle.  Denies history of diabetes.  In the ED, temperature 99.0 F, HR 105, RR 20, BP 140/70, SpO2 96% on room air.  WBC 13.2, hemoglobin 16.4, platelets 210.  Sodium 137, potassium 3.9, chloride 104, CO2 24, glucose 130, BUN 15, creatinine 1.02.  AST 22, ALT 25, total bilirubin 0.7.  Lactic acid 1.6.  X-ray left foot/tibia/fibula with soft tissue swelling left lower leg, foot and ankle, intact ankle fixation hardware without evidence of complication, no evidence of acute osseous abnormality.  Blood cultures x 2 obtained.  Orthopedics consulted.  Patient was started on IV vancomycin/ceftriaxone/Flagyl.  TRH consulted for further evaluation management of persistent right lower extremity cellulitis with concern for possible deep infection with failed outpatient antibiotic treatment.  Assessment & Plan:   Sepsis, POA Streptococcus septicemia Right lower extremity cellulitis Patient presenting to ED with persistent erythema, edema, pain to right lower extremity despite course of outpatient Keflex and doxycycline.  Complicated by his history of left ankle fracture s/p arthrocentesis 2022 by Dr. Lucia Gaskins.  Patient with tachycardia, elevated WBC count of 13.2 on admission.  Lactic acid 1.2.  Hemoglobin A1c 5.8.  MR left ankle with and without contrast with subcutaneous edema, small  skin wound medial distal tibial metaphysis, suspected sinus tract extending to bone with mild surrounding peripheral enhancement consistent with soft tissue infection, no drainable fluid collection identified, no evidence of osteomyelitis.  Seen by orthopedics, no surgical indication at this time; with consideration of hardware removal outpatient once infection clears.  ABIs within normal limits. -- Orthopedics, infectious disease following, appreciate assistance -- WBC 13.2>16.5>15.7 -- CRP 6.8 -- Blood cultures 1/4:  Streptococcus group D, susceptibilities pending -- Blood Cultures 2/17: No growth less than 24 hours -- Linezolid 600 mg IV every 12 hours -- Ceftriaxone 2 g IV every 24 hours -- Metronidazole 500 mg IV every 12 hours -- Oxycodone 5-10 mg PO q4h PRN moderate pain -- Dilaudid 0.5 mg IV every 2 hours as needed severe pain -- LR at 75 mL/h -- CBC daily  Hx essential hypertension Currently not on antihypertensive therapy. -- Hydralazine 18m IV q4h PRN SBP >180 -- Continue monitor BP closely  Tobacco use disorder Counseled on need for complete cessation/abstinence. -- Nicotine patch  Obesity Body mass index is 36.94 kg/m.  Discussed with patient needs for aggressive lifestyle changes/weight loss as this complicates all facets of care.  Outpatient follow-up with PCP.  May benefit from bariatric evaluation outpatient.   DVT prophylaxis: heparin injection 5,000 Units Start: 10/30/22 1400    Code Status: Full Code Family Communication: No family present bedside this morning  Disposition Plan:  Level of care: Med-Surg Status is: Inpatient Remains inpatient appropriate because: IV antibiotics, repeat blood cultures today, further per orthopedic/ID.  Consultants:  Infectious disease Orthopedic surgery  Procedures:  None  Antimicrobials:  Vancomycin 2/15 - 2/15 Cefepime 2/15 - 2/15 Linezolid 2/16>> Ceftriaxone  2/15>> Metronidazole 2/15>>   Subjective: Patient  seen examined bedside, resting comfortably.  Continues with pain to left foot/ankle.  Swelling improved.  Seen by orthopedics this morning, no indication for surgical intervention at this time as no deep infection identified on MRI with consideration of hardware removal outpatient once infection clears.  Repeat blood cultures obtained today.  Remains on IV antibiotics.  Afebrile past 24 hours.  No other specific complaints or concerns at this time.  Denies headache, no dizziness, no chest pain, no palpitations, no fever, no chills/night sweats, no nausea/vomiting/diarrhea, no focal weakness, no fatigue, no paresthesias.  No acute events overnight per nursing staff.  Objective: Vitals:   10/31/22 1607 10/31/22 2025 11/01/22 0456 11/01/22 0718  BP: 136/66 138/77 130/77 139/69  Pulse: 93 91 87 90  Resp: 16  18 16  $ Temp: 98.3 F (36.8 C) 98.5 F (36.9 C) 98.8 F (37.1 C) 98.8 F (37.1 C)  TempSrc: Oral Oral Oral Oral  SpO2: 100% 97% 95% 94%  Weight:      Height:        Intake/Output Summary (Last 24 hours) at 11/01/2022 1109 Last data filed at 11/01/2022 0800 Gross per 24 hour  Intake 2352.91 ml  Output 1950 ml  Net 402.91 ml   Filed Weights   10/30/22 0903  Weight: 127 kg    Examination:  Physical Exam: GEN: NAD, alert and oriented x 3, obese HEENT: NCAT, PERRL, EOMI, sclera clear, MMM PULM: CTAB w/o wheezes/crackles, normal respiratory effort, on room air CV: RRR w/o M/G/R GI: abd soft, NTND, NABS, no R/G/M MSK: Noted edema, erythema and TTP to palpation in the left ankle/lower left leg, Ace wrap in place, moves all extremities independently NEURO: CN II-XII intact, no focal deficits, sensation to light touch intact PSYCH: normal mood/affect Integumentary: Edema, erythema noted to left lower extremity as depicted below, otherwise no other concerning rashes/lesions/wounds noted on exposed skin surfaces.           Data Reviewed: I have personally reviewed following labs  and imaging studies  CBC: Recent Labs  Lab 10/30/22 0911 10/31/22 0411 11/01/22 0618  WBC 13.2* 16.5* 15.7*  NEUTROABS 10.4*  --   --   HGB 16.4 14.2 13.8  HCT 46.6 41.1 39.3  MCV 88.4 89.2 88.1  PLT 210 174 0000000   Basic Metabolic Panel: Recent Labs  Lab 10/30/22 0911 10/31/22 0411 11/01/22 0618  NA 137 131* 129*  K 3.9 3.9 3.8  CL 104 98 98  CO2 24 24 23  $ GLUCOSE 130* 115* 103*  BUN 15 9 9  $ CREATININE 1.02 0.94 0.86  CALCIUM 9.1 8.2* 8.2*   GFR: Estimated Creatinine Clearance: 146.6 mL/min (by C-G formula based on SCr of 0.86 mg/dL). Liver Function Tests: Recent Labs  Lab 10/30/22 0911  AST 22  ALT 25  ALKPHOS 75  BILITOT 0.7  PROT 7.6  ALBUMIN 3.7   No results for input(s): "LIPASE", "AMYLASE" in the last 168 hours. No results for input(s): "AMMONIA" in the last 168 hours. Coagulation Profile: No results for input(s): "INR", "PROTIME" in the last 168 hours. Cardiac Enzymes: No results for input(s): "CKTOTAL", "CKMB", "CKMBINDEX", "TROPONINI" in the last 168 hours. BNP (last 3 results) No results for input(s): "PROBNP" in the last 8760 hours. HbA1C: Recent Labs    10/30/22 1539  HGBA1C 5.5   CBG: No results for input(s): "GLUCAP" in the last 168 hours. Lipid Profile: No results for input(s): "CHOL", "HDL", "LDLCALC", "TRIG", "CHOLHDL", "LDLDIRECT" in the  last 72 hours. Thyroid Function Tests: No results for input(s): "TSH", "T4TOTAL", "FREET4", "T3FREE", "THYROIDAB" in the last 72 hours. Anemia Panel: No results for input(s): "VITAMINB12", "FOLATE", "FERRITIN", "TIBC", "IRON", "RETICCTPCT" in the last 72 hours. Sepsis Labs: Recent Labs  Lab 10/30/22 0911 10/30/22 1145  LATICACIDVEN 1.6 1.2    Recent Results (from the past 240 hour(s))  Culture, blood (routine x 2)     Status: Abnormal (Preliminary result)   Collection Time: 10/30/22 10:00 AM   Specimen: BLOOD RIGHT FOREARM  Result Value Ref Range Status   Specimen Description BLOOD RIGHT  FOREARM  Final   Special Requests   Final    BOTTLES DRAWN AEROBIC AND ANAEROBIC Blood Culture adequate volume   Culture  Setup Time   Final    GRAM POSITIVE COCCI AEROBIC BOTTLE ONLY CRITICAL RESULT CALLED TO, READ BACK BY AND VERIFIED WITH: T RUDISILL,PHARMD@0152$  10/31/22 [place]    Culture (A)  Final    STREPTOCOCCUS GROUP G SUSCEPTIBILITIES TO FOLLOW Performed at [facility], [address], [place], [place] [address]    Report Status PENDING  Incomplete  Blood Culture ID Panel (Reflexed)     Status: Abnormal   Collection Time: 10/30/22 10:00 AM  Result Value Ref Range Status   Enterococcus faecalis NOT DETECTED NOT DETECTED Final   Enterococcus Faecium NOT DETECTED NOT DETECTED Final   Listeria monocytogenes NOT DETECTED NOT DETECTED Final   Staphylococcus species NOT DETECTED NOT DETECTED Final   Staphylococcus aureus (BCID) NOT DETECTED NOT DETECTED Final   Staphylococcus epidermidis NOT DETECTED NOT DETECTED Final   Staphylococcus lugdunensis NOT DETECTED NOT DETECTED Final   Streptococcus species DETECTED (A) NOT DETECTED Final    Comment: Not Enterococcus species, Streptococcus agalactiae, Streptococcus pyogenes, or Streptococcus pneumoniae. CRITICAL RESULT CALLED TO, READ BACK BY AND VERIFIED WITH: T RUDISILL,PHARMD@0152$  10/31/22 [place]    Streptococcus agalactiae NOT DETECTED NOT DETECTED Final   Streptococcus pneumoniae NOT DETECTED NOT DETECTED Final   Streptococcus pyogenes NOT DETECTED NOT DETECTED Final   A.calcoaceticus-baumannii NOT DETECTED NOT DETECTED Final   Bacteroides fragilis NOT DETECTED NOT DETECTED Final   Enterobacterales NOT DETECTED NOT DETECTED Final   Enterobacter cloacae complex NOT DETECTED NOT DETECTED Final   Escherichia coli NOT DETECTED NOT DETECTED Final   Klebsiella aerogenes NOT DETECTED NOT DETECTED Final   Klebsiella oxytoca NOT DETECTED NOT DETECTED Final   Klebsiella pneumoniae NOT DETECTED NOT DETECTED Final   Proteus species NOT  DETECTED NOT DETECTED Final   Salmonella species NOT DETECTED NOT DETECTED Final   Serratia marcescens NOT DETECTED NOT DETECTED Final   Haemophilus influenzae NOT DETECTED NOT DETECTED Final   Neisseria meningitidis NOT DETECTED NOT DETECTED Final   Pseudomonas aeruginosa NOT DETECTED NOT DETECTED Final   Stenotrophomonas maltophilia NOT DETECTED NOT DETECTED Final   Candida albicans NOT DETECTED NOT DETECTED Final   Candida auris NOT DETECTED NOT DETECTED Final   Candida glabrata NOT DETECTED NOT DETECTED Final   Candida krusei NOT DETECTED NOT DETECTED Final   Candida parapsilosis NOT DETECTED NOT DETECTED Final   Candida tropicalis NOT DETECTED NOT DETECTED Final   Cryptococcus neoformans/gattii NOT DETECTED NOT DETECTED Final    Comment: Performed at [facility], [address], [place], [place] [address]  Culture, blood (routine x 2)     Status: None (Preliminary result)   Collection Time: 10/30/22 10:07 AM   Specimen: BLOOD LEFT FOREARM  Result Value Ref Range Status   Specimen Description BLOOD LEFT  FOREARM  Final   Special Requests   Final    BOTTLES DRAWN AEROBIC AND ANAEROBIC Blood Culture adequate volume   Culture   Final    NO GROWTH 2 DAYS Performed at [facility], [address] [place], [place] [address]    Report Status PENDING  Incomplete  Culture, blood (Routine X 2) w Reflex to ID Panel     Status: None (Preliminary result)   Collection Time: 11/01/22  6:30 AM   Specimen: BLOOD LEFT ARM  Result Value Ref Range Status   Specimen Description BLOOD LEFT ARM  Final   Special Requests   Final    BOTTLES DRAWN AEROBIC AND ANAEROBIC Blood Culture results may not be optimal due to an excessive volume of blood received in culture bottles   Culture   Final    NO GROWTH < 12 HOURS Performed at [facility], [place] [address], [place], [place] [address]    Report Status PENDING  Incomplete  Culture, blood (Routine X 2) w Reflex to ID Panel     Status:  None (Preliminary result)   Collection Time: 11/01/22  6:30 AM   Specimen: BLOOD RIGHT ARM  Result Value Ref Range Status   Specimen Description BLOOD RIGHT ARM  Final   Special Requests   Final    BOTTLES DRAWN AEROBIC AND ANAEROBIC Blood Culture results may not be optimal due to an excessive volume of blood received in culture bottles   Culture   Final    NO GROWTH < 12 HOURS Performed at [facility], [place] [address], [place],  [address]    Report Status PENDING  Incomplete         Radiology Studies: VAS [place] ABI WITH/WO TBI  Result Date: 10/31/2022  LOWER EXTREMITY DOPPLER STUDY Patient Name:  DAEJON ABDELAZIZ  Date of Exam:   10/31/2022 Medical Rec #: [id number]           Accession #:    [id number] Date of Birth: 11/21/1975            Patient Gender: M Patient Age:   3 years Exam Location:  [facility] Procedure:      VAS [place] ABI WITH/WO TBI Referring Phys: Anderson Malta YATES --------------------------------------------------------------------------------  Indications: Cellultis w/ ulceration of LLE (ankle) High Risk Factors: Hypertension, current smoker. Other Factors: Left ankle fracture 09/2020 requiring ORIF complicated by                avascular necrosis followed by left ankle arthrodesis (06/2021).  Comparison Study: No previous exams Performing Technologist: Hill, Jody RVT, RDMS  Examination Guidelines: A complete evaluation includes at minimum, Doppler waveform signals and systolic blood pressure reading at the level of bilateral brachial, anterior tibial, and posterior tibial arteries, when vessel segments are accessible. Bilateral testing is considered an integral part of a complete examination. Photoelectric Plethysmograph (PPG) waveforms and toe systolic pressure readings are included as required and additional duplex testing as needed. Limited examinations for reoccurring indications may be performed as noted.  ABI Findings:  +---------+------------------+-----+---------+--------+ Right    Rt Pressure (mmHg)IndexWaveform Comment  +---------+------------------+-----+---------+--------+ Brachial 150                    triphasic         +---------+------------------+-----+---------+--------+ PTA      155               1.03 triphasic         +---------+------------------+-----+---------+--------+  DP       167               1.11 triphasic         +---------+------------------+-----+---------+--------+ Great Toe171               1.14 Normal            +---------+------------------+-----+---------+--------+ +---------+------------------+-----+---------+-------+ Left     Lt Pressure (mmHg)IndexWaveform Comment +---------+------------------+-----+---------+-------+ Brachial 148                    triphasic        +---------+------------------+-----+---------+-------+ PTA      164               1.09 triphasic        +---------+------------------+-----+---------+-------+ DP       167               1.11 triphasic        +---------+------------------+-----+---------+-------+ Great Toe107               0.71 Normal           +---------+------------------+-----+---------+-------+ +-------+-----------+-----------+------------+------------+ ABI/TBIToday's ABIToday's TBIPrevious ABIPrevious TBI +-------+-----------+-----------+------------+------------+ Right  1.11       1.14                                +-------+-----------+-----------+------------+------------+ Left   1.11       0.71                                +-------+-----------+-----------+------------+------------+   Summary: Right: Resting right ankle-brachial index is within normal range. The right toe-brachial index is normal. Left: Resting left ankle-brachial index is within normal range. The left toe-brachial index is normal. *See table(s) above for measurements and observations.  Electronically signed by  Deitra Mayo MD on 10/31/2022 at 5:06:52 PM.    Final    MR ANKLE LEFT W WO CONTRAST  Result Date: 10/31/2022 CLINICAL DATA:  Soft tissue infection suspected. History of ankle arthroscopy with arthrodesis 06/25/2021. EXAM: MRI OF THE LEFT ANKLE WITHOUT AND WITH CONTRAST TECHNIQUE: Multiplanar, multisequence MR imaging of the ankle was performed before and after the administration of intravenous contrast. CONTRAST:  18m GADAVIST GADOBUTROL 1 MMOL/ML IV SOLN COMPARISON:  Radiographs 10/30/2022.  CT 05/16/2021. FINDINGS: Protocol was adjusted to minimize artifact from the surgical hardware. However, the artifact is not completely eliminated, and fat saturation is suboptimal. TENDONS Peroneal: Partly obscured by artifact. Grossly intact without significant tenosynovitis. Posteromedial: Partly obscured by artifact. Grossly intact without significant tenosynovitis. Anterior: Intact and normally positioned. Achilles: Intact. Plantar Fascia: Intact. LIGAMENTS Lateral: Largely obscured by artifact. Medial: Partly obscured by artifact. No acute abnormality identified. CARTILAGE AND BONES Ankle Joint: Extensive postsurgical changes from previous ORIF of the distal tibia and fibula with subsequent tibiotalar arthrodesis. The tibiotalar ankylosis appears solid. Subtalar Joints/Sinus Tarsi: Mild subtalar arthropathy with small nonspecific effusion. No significant inflammation or suspicious enhancement identified in the tarsal sinus. Bones: As above, extensive postsurgical changes from previous ORIF and arthrodesis. Allowing for the artifact associated with the surgical hardware, no evidence of acute fracture, dislocation, marrow signal abnormality or abnormal enhancement. Other: Subcutaneous edema within the distal lower leg with an apparent a small skin wound medial to the distal tibial metaphysis. Suspected underlying sinus tract extending to the bone with mild surrounding peripheral enhancement, suboptimally  evaluated due to artifact from the adjacent hardware. No drainable fluid collection is identified. No focal soft tissue inflammation identified at the ankle. IMPRESSION: IMPRESSION 1. Extensive postsurgical changes from previous ORIF of the distal tibia and fibula with subsequent tibiotalar arthrodesis. The tibiotalar ankylosis appears solid. 2. Subcutaneous edema within the distal lower leg with an apparent small skin wound medial to the distal tibial metaphysis. Suspected underlying sinus tract extending to the bone with mild surrounding peripheral enhancement, suspicious for soft tissue infection, suboptimally evaluated due to artifact from the adjacent hardware. No drainable fluid collection identified. 3. No evidence of osteomyelitis. 4. The ankle tendons and ligaments appear intact. 5. Given the artifact associated with the hardware, follow-up with CT may be helpful. Electronically Signed   By: Richardean Sale M.D.   On: 10/31/2022 15:58        Scheduled Meds:  docusate sodium  100 mg Oral BID   feeding supplement  237 mL Oral BID BM   heparin  5,000 Units Subcutaneous Q8H   multivitamin with minerals  1 tablet Oral Daily   nicotine  14 mg Transdermal Daily   nutrition supplement (JUVEN)  1 packet Oral BID BM   Continuous Infusions:  cefTRIAXone (ROCEPHIN)  IV 200 mL/hr at 10/31/22 1814   linezolid (ZYVOX) IV 600 mg (11/01/22 0919)   metronidazole 500 mg (11/01/22 0342)     LOS: 2 days    Time spent: 52 minutes spent on chart review, discussion with nursing staff, consultants, updating family and interview/physical exam; more than 50% of that time was spent in counseling and/or coordination of care.    Ouita Nish J [place] DO [facility] Available via Epic secure chat 7am-7pm After these hours, please refer to coverage provider listed on [email or web address] 11/01/2022, 11:09 AM

## 2022-11-01 NOTE — Plan of Care (Signed)

## 2022-11-01 NOTE — Progress Notes (Signed)
Subjective:   Left lower leg cellulitis in the setting of prior left ankle arthrodesis   Patient resting comfortably this morning. Had MRI yesterday. He continues under ID and medicine team management.  Objective: Vital signs in last 24 hours: Temp:  [98.3 F (36.8 C)-98.8 F (37.1 C)] 98.8 F (37.1 C) (02/17 0718) Pulse Rate:  [87-93] 90 (02/17 0718) Resp:  [16-18] 16 (02/17 0718) BP: (130-139)/(66-77) 139/69 (02/17 0718) SpO2:  [94 %-100 %] 94 % (02/17 0718)  Labs: Recent Labs    10/30/22 0911 10/31/22 0411 11/01/22 0618  HGB 16.4 14.2 13.8   Recent Labs    10/31/22 0411 11/01/22 0618  WBC 16.5* 15.7*  RBC 4.61 4.46  HCT 41.1 39.3  PLT 174 175   Recent Labs    10/31/22 0411 11/01/22 0618  NA 131* 129*  K 3.9 3.8  CL 98 98  CO2 24 23  BUN 9 9  CREATININE 0.94 0.86  GLUCOSE 115* 103*  CALCIUM 8.2* 8.2*   No results for input(s): "LABPT", "INR" in the last 72 hours.  Physical Exam:  Neurologically intact ABD soft Neurovascular intact Dorsiflexion/Plantar flexion intact Examination of the left lower extremity shows erythema and swelling globally about the ankle that extends to the level of the mid calf.  There are some chronic skin changes on both lower extremities.  Does have a small draining wound about the medial ankle.  There is no obvious purulent drainage.  It is blood-tinged.  He has significant tenderness and hypersensitivity throughout the ankle and lower leg.  He is able to wiggle the toes.  The ankle appears well fused to attempted passive manipulation but does elicit discomfort.  Calf is soft and nontender proximally.  Sensation grossly intact.  Warm and well-perfused distally.    Assessment/Plan:    Left lower extremity cellulitis in the setting of prior left ankle arthrodesis in 2022   MRI shows no evidence of deep infection at this time. Hardware could be removed in an outpatient setting once infection has resolved. Continue Medical management  per medicine team and ID. We will continue to follow.    Larwance Sachs Tyreke Kaeser 11/01/2022, 9:32 AM

## 2022-11-02 DIAGNOSIS — L03119 Cellulitis of unspecified part of limb: Secondary | ICD-10-CM | POA: Diagnosis not present

## 2022-11-02 DIAGNOSIS — L02419 Cutaneous abscess of limb, unspecified: Secondary | ICD-10-CM | POA: Diagnosis not present

## 2022-11-02 LAB — BASIC METABOLIC PANEL
Anion gap: 8 (ref 5–15)
BUN: 11 mg/dL (ref 6–20)
CO2: 24 mmol/L (ref 22–32)
Calcium: 8.4 mg/dL — ABNORMAL LOW (ref 8.9–10.3)
Chloride: 99 mmol/L (ref 98–111)
Creatinine, Ser: 0.94 mg/dL (ref 0.61–1.24)
GFR, Estimated: >60 mL/min (ref >60)
Glucose, Bld: 116 mg/dL — ABNORMAL HIGH (ref 70–99)
Potassium: 4.3 mmol/L (ref 3.5–5.1)
Sodium: 131 mmol/L — ABNORMAL LOW (ref 135–145)

## 2022-11-02 LAB — CBC
HCT: 38.9 % — ABNORMAL LOW (ref 39.0–52.0)
Hemoglobin: 13.4 g/dL (ref 13.0–17.0)
MCH: 30.3 pg (ref 26.0–34.0)
MCHC: 34.4 g/dL (ref 30.0–36.0)
MCV: 88 fL (ref 80.0–100.0)
Platelets: 198 10*3/uL (ref 150–400)
RBC: 4.42 MIL/uL (ref 4.22–5.81)
RDW: 12.5 % (ref 11.5–15.5)
WBC: 11.1 10*3/uL — ABNORMAL HIGH (ref 4.0–10.5)
nRBC: 0 % (ref 0.0–0.2)

## 2022-11-02 LAB — CULTURE, BLOOD (ROUTINE X 2): Special Requests: ADEQUATE

## 2022-11-02 NOTE — Progress Notes (Signed)
Subjective:   Left lower leg cellulitis in the setting of prior left ankle arthrodesis    Patient resting comfortably this morning. White count continues to come down.   Objective: Vital signs in last 24 hours: Temp:  [98.5 F (36.9 C)-99.5 F (37.5 C)] 98.5 F (36.9 C) (02/18 0634) Pulse Rate:  [83-96] 83 (02/18 0634) Resp:  [16-17] 17 (02/18 0634) BP: (128-140)/(67-83) 132/83 (02/18 0634) SpO2:  [98 %] 98 % (02/18 0634)  Labs: Recent Labs    10/31/22 0411 11/01/22 0618 11/02/22 0619  HGB 14.2 13.8 13.4   Recent Labs    11/01/22 0618 11/02/22 0619  WBC 15.7* 11.1*  RBC 4.46 4.42  HCT 39.3 38.9*  PLT 175 198   Recent Labs    11/01/22 0618 11/02/22 0619  NA 129* 131*  K 3.8 4.3  CL 98 99  CO2 23 24  BUN 9 11  CREATININE 0.86 0.94  GLUCOSE 103* 116*  CALCIUM 8.2* 8.4*   No results for input(s): "LABPT", "INR" in the last 72 hours.  Physical Exam:  Examination of the left lower extremity shows erythema and swelling globally about the ankle that extends to the level of the mid calf.  There are some chronic skin changes on both lower extremities.  Does have a small draining wound about the medial ankle.  There is no obvious purulent drainage.  It is blood-tinged.  He has significant tenderness and hypersensitivity throughout the ankle and lower leg.  He is able to wiggle the toes.   Assessment/Plan: Left lower extremity cellulitis in the setting of prior left ankle arthrodesis in 2022    Patinet resting comfortably. White count is continuing to come down which is ggood. No surgery indicated at this time. Continue Medical management per medicine team and ID. We will continue to follow.        Larwance Sachs Jamala Kohen 11/02/2022, 10:04 AM

## 2022-11-02 NOTE — Progress Notes (Signed)
PROGRESS NOTE    [name]  [id number] DOB: Jan 31, 1976 DOA: 10/30/2022 PCP: Patient, No Pcp Per    Brief Narrative:   [name] is a 47 y.o. 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 Lucia Marshall 2022 who presented to [facility] ED with persistent erythema, swelling, and pain to his left lower extremity.  Patient has been treated for cellulitis outpatient with a course of doxycycline and Keflex without improvement.  Also reports now ulceration to ankle.  Denies history of diabetes.  In the ED, temperature 99.0 F, HR 105, RR 20, BP 140/70, SpO2 96% on room air.  WBC 13.2, hemoglobin 16.4, platelets 210.  Sodium 137, potassium 3.9, chloride 104, CO2 24, glucose 130, BUN 15, creatinine 1.02.  AST 22, ALT 25, total bilirubin 0.7.  Lactic acid 1.6.  X-ray left foot/tibia/fibula with soft tissue swelling left lower leg, foot and ankle, intact ankle fixation hardware without evidence of complication, no evidence of acute osseous abnormality.  Blood cultures x 2 obtained.  Orthopedics consulted.  Patient was started on IV vancomycin/ceftriaxone/Flagyl.  TRH consulted for further evaluation management of persistent right lower extremity cellulitis with concern for possible deep infection with failed outpatient antibiotic treatment.  Assessment & Plan:   Sepsis, POA Streptococcus septicemia Right lower extremity cellulitis with possible sinus tract Patient presenting to ED with persistent erythema, edema, pain to right lower extremity despite course of outpatient Keflex and doxycycline.  Complicated by his history of left ankle fracture s/p arthrocentesis 2022 by Dr. Lucia Marshall.  Patient with tachycardia, elevated WBC count of 13.2 on admission.  Lactic acid 1.2.  Hemoglobin A1c 5.8.  MR left ankle with and without contrast with  subcutaneous edema, small skin wound medial distal tibial metaphysis, suspected sinus tract extending to bone with mild surrounding peripheral enhancement consistent with soft tissue infection, no drainable fluid collection identified, no evidence of osteomyelitis.  Seen by orthopedics, no surgical indication at this time; with consideration of hardware removal outpatient once infection clears.  ABIs within normal limits. -- Orthopedics, infectious disease following, appreciate assistance -- WBC 13.2>16.5>15.7>11.1 -- CRP 6.8 -- Blood cultures 1/4:  Streptococcus group D, resistant to clindamycin/erythromycin -- Blood Cultures 2/17: No growth x 1 day -- Linezolid 600 mg IV every 12 hours -- Ceftriaxone 2 g IV every 24 hours -- Metronidazole 500 mg IV every 12 hours -- Oxycodone 5-10 mg PO q4h PRN moderate pain -- Dilaudid 0.5 mg IV every 2 hours as needed severe pain -- CT left ankle with and without contrast today for further evaluation of possible sinus tract -- CBC daily  Hx essential hypertension Currently not on antihypertensive therapy. -- Hydralazine 66m IV q4h PRN SBP >180 -- Continue monitor BP closely  Tobacco use disorder Counseled on need for complete cessation/abstinence. -- Nicotine patch  Obesity Body mass index is 36.94 kg/m.  Discussed with patient needs for aggressive lifestyle changes/weight loss as this complicates all facets of care.  Outpatient follow-up with PCP.  May benefit from bariatric evaluation outpatient.   DVT prophylaxis: heparin injection 5,000 Units Start: 10/30/22 1400    Code Status: Full Code Family Communication: No family present bedside this morning  Disposition Plan:  Level of care: Med-Surg Status is: Inpatient Remains inpatient appropriate because: IV antibiotics, CT left ankle today, further per orthopedic/ID.  Consultants:  Infectious disease Orthopedic surgery  Procedures:  None  Antimicrobials:  Vancomycin 2/15 -  2/15 Cefepime 2/15 - 2/15 Linezolid 2/16>> Ceftriaxone 2/15>> Metronidazole 2/15>>   Subjective: Patient seen examined bedside, resting comfortably.  RN present at bedside.  Continues with pain and swelling to left foot/ankle; remains frustrated due to lack of progress.  By orthopedics PA this morning.  Discussed with infectious disease, Dr. Baxter Flattery; with recommendation of CT left ankle for further evaluation for concern of sinus tract noted on MR.  Remains afebrile with improved leukocytosis.  No other specific complaints or concerns at this time.  Denies headache, no dizziness, no chest pain, no palpitations, no fever, no chills/night sweats, no nausea/vomiting/diarrhea, no focal weakness, no fatigue, no paresthesias.  No acute events overnight per nursing staff.  Objective: Vitals:   11/01/22 0718 11/01/22 1552 11/01/22 1935 11/02/22 0634  BP: 139/69 (!) 140/67 128/72 132/83  Pulse: 90 96 94 83  Resp: 16 16 17 17  $ Temp: 98.8 F (37.1 C) 99.5 F (37.5 C) 99 F (37.2 C) 98.5 F (36.9 C)  TempSrc: Oral Oral Oral Oral  SpO2: 94% 98% 98% 98%  Weight:      Height:        Intake/Output Summary (Last 24 hours) at 11/02/2022 1112 Last data filed at 11/02/2022 1013 Gross per 24 hour  Intake 760.33 ml  Output 2775 ml  Net -2014.67 ml   Filed Weights   10/30/22 0903  Weight: 127 kg    Examination:  Physical Exam: GEN: NAD, alert and oriented x 3, obese HEENT: NCAT, PERRL, EOMI, sclera clear, MMM PULM: CTAB w/o wheezes/crackles, normal respiratory effort, on room air CV: RRR w/o M/G/R GI: abd soft, NTND, NABS, no R/G/M MSK: Noted edema, erythema and TTP to palpation in the left ankle/lower left leg, Ace wrap in place, moves all extremities independently NEURO: CN II-XII intact, no focal deficits, sensation to light touch intact PSYCH: normal mood/affect Integumentary: Edema, erythema noted to left lower extremity as depicted below with Ace wrap in place, otherwise no other  concerning rashes/lesions/wounds noted on exposed skin surfaces.           Data Reviewed: I have personally reviewed following labs and imaging studies  CBC: Recent Labs  Lab 10/30/22 0911 10/31/22 0411 11/01/22 0618 11/02/22 0619  WBC 13.2* 16.5* 15.7* 11.1*  NEUTROABS 10.4*  --   --   --   HGB 16.4 14.2 13.8 13.4  HCT 46.6 41.1 39.3 38.9*  MCV 88.4 89.2 88.1 88.0  PLT 210 174 175 [id number]   Basic Metabolic Panel: Recent Labs  Lab 10/30/22 0911 10/31/22 0411 11/01/22 0618 11/02/22 0619  NA 137 131* 129* 131*  K 3.9 3.9 3.8 4.3  CL 104 98 98 99  CO2 24 24 23 24  $ GLUCOSE 130* 115* 103* 116*  BUN 15 9 9 11  $ CREATININE 1.02 0.94 0.86 0.94  CALCIUM 9.1 8.2* 8.2* 8.4*   GFR: Estimated Creatinine Clearance: 134.2 mL/min (by C-G formula based on SCr of 0.94 mg/dL). Liver Function Tests: Recent Labs  Lab 10/30/22 0911  AST 22  ALT 25  ALKPHOS 75  BILITOT 0.7  PROT 7.6  ALBUMIN 3.7   No results for input(s): "LIPASE", "AMYLASE" in the last 168 hours. No results for input(s): "AMMONIA" in the last 168 hours. Coagulation Profile: No results for input(s): "INR", "PROTIME" in the last 168 hours. Cardiac Enzymes: No results for input(s): "CKTOTAL", "CKMB", "CKMBINDEX", "TROPONINI" in the last 168 hours. BNP (last 3 results) No results for input(s): "PROBNP" in  the last 8760 hours. HbA1C: Recent Labs    10/30/22 1539  HGBA1C 5.5   CBG: No results for input(s): "GLUCAP" in the last 168 hours. Lipid Profile: No results for input(s): "CHOL", "HDL", "LDLCALC", "TRIG", "CHOLHDL", "LDLDIRECT" in the last 72 hours. Thyroid Function Tests: No results for input(s): "TSH", "T4TOTAL", "FREET4", "T3FREE", "THYROIDAB" in the last 72 hours. Anemia Panel: No results for input(s): "VITAMINB12", "FOLATE", "FERRITIN", "TIBC", "IRON", "RETICCTPCT" in the last 72 hours. Sepsis Labs: Recent Labs  Lab 10/30/22 0911 10/30/22 1145  LATICACIDVEN 1.6 1.2    Recent Results (from  the past 240 hour(s))  Culture, blood (routine x 2)     Status: Abnormal   Collection Time: 10/30/22 10:00 AM   Specimen: BLOOD RIGHT FOREARM  Result Value Ref Range Status   Specimen Description BLOOD RIGHT FOREARM  Final   Special Requests   Final    BOTTLES DRAWN AEROBIC AND ANAEROBIC Blood Culture adequate volume   Culture  Setup Time   Final    GRAM POSITIVE COCCI AEROBIC BOTTLE ONLY CRITICAL RESULT CALLED TO, READ BACK BY AND VERIFIED WITH: T RUDISILL,PHARMD@0152$  10/31/22 [place] Performed at [facility], [place] [address], [place], [place] [address]    Culture STREPTOCOCCUS GROUP G (A)  Final   Report Status 11/02/2022 FINAL  Final   Organism ID, Bacteria STREPTOCOCCUS GROUP G  Final      Susceptibility   Streptococcus group g - MIC*    CLINDAMYCIN >=1 RESISTANT Resistant     AMPICILLIN <=0.25 SENSITIVE Sensitive     ERYTHROMYCIN >=8 RESISTANT Resistant     VANCOMYCIN 0.5 SENSITIVE Sensitive     CEFTRIAXONE <=0.12 SENSITIVE Sensitive     LEVOFLOXACIN 0.5 SENSITIVE Sensitive     PENICILLIN <=0.06 SENSITIVE Sensitive     * STREPTOCOCCUS GROUP G  Blood Culture ID Panel (Reflexed)     Status: Abnormal   Collection Time: 10/30/22 10:00 AM  Result Value Ref Range Status   Enterococcus faecalis NOT DETECTED NOT DETECTED Final   Enterococcus Faecium NOT DETECTED NOT DETECTED Final   Listeria monocytogenes NOT DETECTED NOT DETECTED Final   Staphylococcus species NOT DETECTED NOT DETECTED Final   Staphylococcus aureus (BCID) NOT DETECTED NOT DETECTED Final   Staphylococcus epidermidis NOT DETECTED NOT DETECTED Final   Staphylococcus lugdunensis NOT DETECTED NOT DETECTED Final   Streptococcus species DETECTED (A) NOT DETECTED Final    Comment: Not Enterococcus species, Streptococcus agalactiae, Streptococcus pyogenes, or Streptococcus pneumoniae. CRITICAL RESULT CALLED TO, READ BACK BY AND VERIFIED WITH: T RUDISILL,PHARMD@0152$  10/31/22 [place]    Streptococcus agalactiae NOT DETECTED  NOT DETECTED Final   Streptococcus pneumoniae NOT DETECTED NOT DETECTED Final   Streptococcus pyogenes NOT DETECTED NOT DETECTED Final   A.calcoaceticus-baumannii NOT DETECTED NOT DETECTED Final   Bacteroides fragilis NOT DETECTED NOT DETECTED Final   Enterobacterales NOT DETECTED NOT DETECTED Final   Enterobacter cloacae complex NOT DETECTED NOT DETECTED Final   Escherichia coli NOT DETECTED NOT DETECTED Final   Klebsiella aerogenes NOT DETECTED NOT DETECTED Final   Klebsiella oxytoca NOT DETECTED NOT DETECTED Final   Klebsiella pneumoniae NOT DETECTED NOT DETECTED Final   Proteus species NOT DETECTED NOT DETECTED Final   Salmonella species NOT DETECTED NOT DETECTED Final   Serratia marcescens NOT DETECTED NOT DETECTED Final   Haemophilus influenzae NOT DETECTED NOT DETECTED Final   Neisseria meningitidis NOT DETECTED NOT DETECTED Final   Pseudomonas aeruginosa NOT DETECTED NOT DETECTED Final   Stenotrophomonas maltophilia NOT DETECTED NOT DETECTED  Final   Candida albicans NOT DETECTED NOT DETECTED Final   Candida auris NOT DETECTED NOT DETECTED Final   Candida glabrata NOT DETECTED NOT DETECTED Final   Candida krusei NOT DETECTED NOT DETECTED Final   Candida parapsilosis NOT DETECTED NOT DETECTED Final   Candida tropicalis NOT DETECTED NOT DETECTED Final   Cryptococcus neoformans/gattii NOT DETECTED NOT DETECTED Final    Comment: Performed at [facility], [place] [address], [place], [place] [address]  Culture, blood (routine x 2)     Status: None (Preliminary result)   Collection Time: 10/30/22 10:07 AM   Specimen: BLOOD LEFT FOREARM  Result Value Ref Range Status   Specimen Description BLOOD LEFT FOREARM  Final   Special Requests   Final    BOTTLES DRAWN AEROBIC AND ANAEROBIC Blood Culture adequate volume   Culture   Final    NO GROWTH 3 DAYS Performed at [facility], [place] [address], [place], [place] [address]    Report Status PENDING  Incomplete  Culture, blood  (Routine X 2) w Reflex to ID Panel     Status: None (Preliminary result)   Collection Time: 11/01/22  6:30 AM   Specimen: BLOOD LEFT ARM  Result Value Ref Range Status   Specimen Description BLOOD LEFT ARM  Final   Special Requests   Final    BOTTLES DRAWN AEROBIC AND ANAEROBIC Blood Culture results may not be optimal due to an excessive volume of blood received in culture bottles   Culture   Final    NO GROWTH 1 DAY Performed at [facility], [place] [address], [place], [place] [address]    Report Status PENDING  Incomplete  Culture, blood (Routine X 2) w Reflex to ID Panel     Status: None (Preliminary result)   Collection Time: 11/01/22  6:30 AM   Specimen: BLOOD RIGHT ARM  Result Value Ref Range Status   Specimen Description BLOOD RIGHT ARM  Final   Special Requests   Final    BOTTLES DRAWN AEROBIC AND ANAEROBIC Blood Culture results may not be optimal due to an excessive volume of blood received in culture bottles   Culture   Final    NO GROWTH 1 DAY Performed at [facility], [place] [address] [place], [place] [address]    Report Status PENDING  Incomplete         Radiology Studies: VAS [place] ABI WITH/WO TBI  Result Date: 10/31/2022  LOWER EXTREMITY DOPPLER STUDY Patient Name:  ORLO HOPP  Date of Exam:   10/31/2022 Medical Rec #: [id number]           Accession #:    [id number] Date of Birth: May 08, 1976            Patient Gender: M Patient Age:   31 years Exam Location:  [facility] Procedure:      VAS [place] ABI WITH/WO TBI Referring Phys: Anderson Malta YATES --------------------------------------------------------------------------------  Indications: Cellultis w/ ulceration of LLE (ankle) High Risk Factors: Hypertension, current smoker. Other Factors: Left ankle fracture 09/2020 requiring ORIF complicated by                avascular necrosis followed by left ankle arthrodesis (06/2021).  Comparison Study: No previous exams Performing Technologist: Hill, Jody RVT, RDMS   Examination Guidelines: A complete evaluation includes at minimum, Doppler waveform signals and systolic blood pressure reading at the level of bilateral brachial, anterior tibial, and posterior tibial arteries, when  vessel segments are accessible. Bilateral testing is considered an integral part of a complete examination. Photoelectric Plethysmograph (PPG) waveforms and toe systolic pressure readings are included as required and additional duplex testing as needed. Limited examinations for reoccurring indications may be performed as noted.  ABI Findings: +---------+------------------+-----+---------+--------+ Right    Rt Pressure (mmHg)IndexWaveform Comment  +---------+------------------+-----+---------+--------+ Brachial 150                    triphasic         +---------+------------------+-----+---------+--------+ PTA      155               1.03 triphasic         +---------+------------------+-----+---------+--------+ DP       167               1.11 triphasic         +---------+------------------+-----+---------+--------+ Great Toe171               1.14 Normal            +---------+------------------+-----+---------+--------+ +---------+------------------+-----+---------+-------+ Left     Lt Pressure (mmHg)IndexWaveform Comment +---------+------------------+-----+---------+-------+ Brachial 148                    triphasic        +---------+------------------+-----+---------+-------+ PTA      164               1.09 triphasic        +---------+------------------+-----+---------+-------+ DP       167               1.11 triphasic        +---------+------------------+-----+---------+-------+ Great Toe107               0.71 Normal           +---------+------------------+-----+---------+-------+ +-------+-----------+-----------+------------+------------+ ABI/TBIToday's ABIToday's TBIPrevious ABIPrevious TBI  +-------+-----------+-----------+------------+------------+ Right  1.11       1.14                                +-------+-----------+-----------+------------+------------+ Left   1.11       0.71                                +-------+-----------+-----------+------------+------------+   Summary: Right: Resting right ankle-brachial index is within normal range. The right toe-brachial index is normal. Left: Resting left ankle-brachial index is within normal range. The left toe-brachial index is normal. *See table(s) above for measurements and observations.  Electronically signed by Deitra Mayo MD on 10/31/2022 at 5:06:52 PM.    Final    MR ANKLE LEFT W WO CONTRAST  Result Date: 10/31/2022 CLINICAL DATA:  Soft tissue infection suspected. History of ankle arthroscopy with arthrodesis 06/25/2021. EXAM: MRI OF THE LEFT ANKLE WITHOUT AND WITH CONTRAST TECHNIQUE: Multiplanar, multisequence MR imaging of the ankle was performed before and after the administration of intravenous contrast. CONTRAST:  36m GADAVIST GADOBUTROL 1 MMOL/ML IV SOLN COMPARISON:  Radiographs 10/30/2022.  CT 05/16/2021. FINDINGS: Protocol was adjusted to minimize artifact from the surgical hardware. However, the artifact is not completely eliminated, and fat saturation is suboptimal. TENDONS Peroneal: Partly obscured by artifact. Grossly intact without significant tenosynovitis. Posteromedial: Partly obscured by artifact. Grossly intact without significant tenosynovitis. Anterior: Intact and normally positioned. Achilles: Intact. Plantar Fascia: Intact. LIGAMENTS Lateral: Largely obscured by artifact. Medial: Partly obscured  by artifact. No acute abnormality identified. CARTILAGE AND BONES Ankle Joint: Extensive postsurgical changes from previous ORIF of the distal tibia and fibula with subsequent tibiotalar arthrodesis. The tibiotalar ankylosis appears solid. Subtalar Joints/Sinus Tarsi: Mild subtalar arthropathy with small  nonspecific effusion. No significant inflammation or suspicious enhancement identified in the tarsal sinus. Bones: As above, extensive postsurgical changes from previous ORIF and arthrodesis. Allowing for the artifact associated with the surgical hardware, no evidence of acute fracture, dislocation, marrow signal abnormality or abnormal enhancement. Other: Subcutaneous edema within the distal lower leg with an apparent a small skin wound medial to the distal tibial metaphysis. Suspected underlying sinus tract extending to the bone with mild surrounding peripheral enhancement, suboptimally evaluated due to artifact from the adjacent hardware. No drainable fluid collection is identified. No focal soft tissue inflammation identified at the ankle. IMPRESSION: IMPRESSION 1. Extensive postsurgical changes from previous ORIF of the distal tibia and fibula with subsequent tibiotalar arthrodesis. The tibiotalar ankylosis appears solid. 2. Subcutaneous edema within the distal lower leg with an apparent small skin wound medial to the distal tibial metaphysis. Suspected underlying sinus tract extending to the bone with mild surrounding peripheral enhancement, suspicious for soft tissue infection, suboptimally evaluated due to artifact from the adjacent hardware. No drainable fluid collection identified. 3. No evidence of osteomyelitis. 4. The ankle tendons and ligaments appear intact. 5. Given the artifact associated with the hardware, follow-up with CT may be helpful. Electronically Signed   By: Richardean Sale M.D.   On: 10/31/2022 15:58        Scheduled Meds:  docusate sodium  100 mg Oral BID   feeding supplement  237 mL Oral BID BM   heparin  5,000 Units Subcutaneous Q8H   multivitamin with minerals  1 tablet Oral Daily   nicotine  14 mg Transdermal Daily   nutrition supplement (JUVEN)  1 packet Oral BID BM   Continuous Infusions:  cefTRIAXone (ROCEPHIN)  IV 2 g (11/01/22 1808)   linezolid (ZYVOX) IV 600 mg  (11/02/22 0930)   metronidazole 500 mg (11/02/22 0341)     LOS: 3 days    Time spent: 49 minutes spent on chart review, discussion with nursing staff, consultants, updating family and interview/physical exam; more than 50% of that time was spent in counseling and/or coordination of care.    Dieter Hane J [place] DO [facility] Available via Epic secure chat 7am-7pm After these hours, please refer to coverage provider listed on [email or web address] 11/02/2022, 11:12 AM

## 2022-11-03 ENCOUNTER — Inpatient Hospital Stay (HOSPITAL_COMMUNITY): Payer: Worker's Compensation

## 2022-11-03 DIAGNOSIS — L03116 Cellulitis of left lower limb: Principal | ICD-10-CM

## 2022-11-03 DIAGNOSIS — R7881 Bacteremia: Secondary | ICD-10-CM | POA: Diagnosis not present

## 2022-11-03 DIAGNOSIS — L03119 Cellulitis of unspecified part of limb: Secondary | ICD-10-CM | POA: Diagnosis not present

## 2022-11-03 DIAGNOSIS — L02419 Cutaneous abscess of limb, unspecified: Secondary | ICD-10-CM | POA: Diagnosis not present

## 2022-11-03 DIAGNOSIS — B955 Unspecified streptococcus as the cause of diseases classified elsewhere: Secondary | ICD-10-CM | POA: Diagnosis not present

## 2022-11-03 LAB — CBC
HCT: 43.4 % (ref 39.0–52.0)
Hemoglobin: 14.7 g/dL (ref 13.0–17.0)
MCH: 30.3 pg (ref 26.0–34.0)
MCHC: 33.9 g/dL (ref 30.0–36.0)
MCV: 89.5 fL (ref 80.0–100.0)
Platelets: 256 10*3/uL (ref 150–400)
RBC: 4.85 MIL/uL (ref 4.22–5.81)
RDW: 12.5 % (ref 11.5–15.5)
WBC: 10.6 10*3/uL — ABNORMAL HIGH (ref 4.0–10.5)
nRBC: 0 % (ref 0.0–0.2)

## 2022-11-03 LAB — BASIC METABOLIC PANEL
Anion gap: 13 (ref 5–15)
BUN: 14 mg/dL (ref 6–20)
CO2: 26 mmol/L (ref 22–32)
Calcium: 9 mg/dL (ref 8.9–10.3)
Chloride: 95 mmol/L — ABNORMAL LOW (ref 98–111)
Creatinine, Ser: 1 mg/dL (ref 0.61–1.24)
GFR, Estimated: >60 mL/min (ref >60)
Glucose, Bld: 122 mg/dL — ABNORMAL HIGH (ref 70–99)
Potassium: 4.2 mmol/L (ref 3.5–5.1)
Sodium: 134 mmol/L — ABNORMAL LOW (ref 135–145)

## 2022-11-03 MED ORDER — IOHEXOL 350 MG/ML SOLN
75.0000 mL | Freq: Once | INTRAVENOUS | Status: AC | PRN
  Administered 2022-11-03: 75 mL via INTRAVENOUS

## 2022-11-03 MED ORDER — FLEET ENEMA 7-19 GM/118ML RE ENEM
1.0000 | ENEMA | Freq: Once | RECTAL | Status: AC
  Administered 2022-11-03: 1 via RECTAL
  Filled 2022-11-03: qty 1

## 2022-11-03 MED ORDER — SODIUM CHLORIDE 0.9 % IV SOLN
2.0000 g | INTRAVENOUS | Status: DC
  Administered 2022-11-03 – 2022-11-04 (×2): 2 g via INTRAVENOUS
  Filled 2022-11-03 (×2): qty 20

## 2022-11-03 NOTE — Progress Notes (Signed)
[facility] for Infectious Disease  Date of Admission:  10/30/2022           Reason for visit: Follow up on strep bacteremia, lower leg infection  Current antibiotics: Linezolid Metronidazole Ceftriaxone  ASSESSMENT:    47 y.o. male admitted with:  Group G Streptococcus bacteremia: Blood cultures + 10/30/2022 and repeat cultures 11/01/2022 negative. History of left ankle ORIF (January 123456) complicated by avascular necrosis status post arthrodesis (October 2022): Imaging this admission with MRI shows cellulitis and possible underlying sinus tract extending to the bone.  This was suboptimal due to artifact and CT is pending today.  RECOMMENDATIONS:    Continue ceftriaxone Discontinue metronidazole and linezolid Will need single-lumen PICC line placed with anticipation of prolonged antibiotic therapy Appreciate orthopedic surgery follow-up.  Consideration of hardware removal as an outpatient Will wait and see what CT scan shows Following   Principal Problem:   Cellulitis and abscess of leg Active Problems:   Essential hypertension   Tobacco dependence   Bacteremia due to Streptococcus    MEDICATIONS:    Scheduled Meds:  docusate sodium  100 mg Oral BID   feeding supplement  237 mL Oral BID BM   heparin  5,000 Units Subcutaneous Q8H   multivitamin with minerals  1 tablet Oral Daily   nicotine  14 mg Transdermal Daily   nutrition supplement (JUVEN)  1 packet Oral BID BM   Continuous Infusions:  cefTRIAXone (ROCEPHIN)  IV     PRN Meds:.acetaminophen **OR** acetaminophen, bisacodyl, hydrALAZINE, HYDROmorphone (DILAUDID) injection, ondansetron **OR** ondansetron (ZOFRAN) IV, oxyCODONE, polyethylene glycol  SUBJECTIVE:   24 hour events:  No acute events  Patient with no specific complaints.  He is waiting on CT scan to be completed.  He seems to be tolerating antibiotics at this time.  Review of Systems  All other systems reviewed and are negative.      OBJECTIVE:   Blood pressure 123/81, pulse 77, temperature 98.4 F (36.9 C), temperature source Oral, resp. rate 16, height 6' 1"$  (1.854 m), weight 127 kg, SpO2 96 %. Body mass index is 36.94 kg/m.  Physical Exam Constitutional:      Appearance: Normal appearance.  HENT:     Head: Normocephalic and atraumatic.  Eyes:     Extraocular Movements: Extraocular movements intact.     Conjunctiva/sclera: Conjunctivae normal.  Pulmonary:     Effort: Pulmonary effort is normal. No respiratory distress.  Abdominal:     General: There is no distension.     Palpations: Abdomen is soft.  Musculoskeletal:     Cervical back: Normal range of motion.     Comments: His left ankle is wrapped in an Ace bandage.  Skin:    General: Skin is warm and dry.  Neurological:     General: No focal deficit present.     Mental Status: He is alert and oriented to person, place, and time.  Psychiatric:        Mood and Affect: Mood normal.        Behavior: Behavior normal.      Lab Results: Lab Results  Component Value Date   WBC 10.6 (H) 11/03/2022   HGB 14.7 11/03/2022   HCT 43.4 11/03/2022   MCV 89.5 11/03/2022   PLT 256 11/03/2022    Lab Results  Component Value Date   NA 134 (L) 11/03/2022   K 4.2 11/03/2022   CO2 26 11/03/2022   GLUCOSE 122 (H) 11/03/2022   BUN 14  11/03/2022   CREATININE 1.00 11/03/2022   CALCIUM 9.0 11/03/2022   GFRNONAA >60 11/03/2022    Lab Results  Component Value Date   ALT 25 10/30/2022   AST 22 10/30/2022   ALKPHOS 75 10/30/2022   BILITOT 0.7 10/30/2022       Component Value Date/Time   CRP 6.8 (H) 10/30/2022 1539       Component Value Date/Time   ESRSEDRATE 23 (H) 10/30/2022 1539     I have reviewed the micro and lab results in Epic.  Imaging: No results found.   Imaging independently reviewed in Epic.    Raynelle Highland for Infectious Disease [facility] [phone] pager 11/03/2022, 12:28 PM

## 2022-11-03 NOTE — TOC Initial Note (Addendum)
Transition of Care (TOC) - Initial/Assessment Note   Spoke to patient at bedside. Patient from home alone .   Discussed IV ABX.   Infusion company will be [employer], he will have a HHRN however HHRN will NOT be there every time a dose is due. Pam from [place] will come to patient room prior to discharge and provide PICC line and IV ABX care.   Patient voiced understanding.   Patient wants everything to go through his [employer].   Patient requesting walker. Asked MD to sign order and ordered walker with Erasmo Downer with [employer] walker was delivered to patient. Nurse secure chatted patient requesting a wider walker . Insurance goes by height and weight. Per Erasmo Downer with [employer] patient is 20 pounds less for qualifying for a bari walker through insurance. Patient can purchase one for $240.00 . Erasmo Downer will call patient and explain and see if he wants to purchase  one . Nurse aware . Erasmo Downer called patient but he hung up   NCM discussed above with patient . Patient wants to send walker back to [employer]. NCM has walker in office . Kristin with [employer] aware and will have it picked up. Patient does not want to buy a walker from [facility] [place]  Patient Details  Name: [name] MRN: [id number] Date of Birth: 07-May-1976  [facility]) CM/SW Contact:    Marilu Favre, RN Phone Number: 11/03/2022, 1:30 PM  Clinical Narrative:                   Expected Discharge Plan: [place] Barriers to Discharge: Continued Medical Work up   Patient Goals and CMS Choice Patient states their goals for this hospitalization and ongoing recovery are:: to return to home CMS Medicare.gov Compare Post Acute Care list provided to:: Patient Choice offered to / list presented to : Patient      Expected Discharge Plan and Services   Discharge Planning Services: CM Consult Post Acute Care Choice: Home Health, Durable Medical Equipment Living arrangements for the past  2 months: Single Family Home                 DME Arranged: Walker rolling DME Agency: [employer] Date DME Agency Contacted: 11/03/22 Time DME Agency Contacted: [id number] Representative spoke with at DME Agency: Erasmo Downer HH Arranged: RN [place] Agency:  Engineer, manufacturing systems and Product/process development scientist)        Prior Living Arrangements/Services Living arrangements for the past 2 months: [place] with:: Self Patient language and need for interpreter reviewed:: Yes Do you feel safe going back to the place where you live?: Yes            Criminal Activity/Legal Involvement Pertinent to Current Situation/Hospitalization: No - Comment as needed  Activities of Daily Living      Permission Sought/Granted   Permission granted to share information with : No              Emotional Assessment Appearance:: Appears stated age Attitude/Demeanor/Rapport: Engaged Affect (typically observed): Accepting Orientation: : Oriented to Self, Oriented to Place, Oriented to  Time, Oriented to Situation Alcohol / Substance Use: Not Applicable Psych Involvement: No (comment)  Admission diagnosis:  Cellulitis and abscess of leg [L03.119, L02.419] Patient Active Problem List   Diagnosis Date Noted   Cellulitis of left lower extremity 11/03/2022   Bacteremia due to Streptococcus 10/31/2022   Cellulitis and abscess of leg 10/30/2022   Essential hypertension 10/30/2022  Tobacco dependence 10/30/2022   Closed displaced trimalleolar fracture of left ankle 10/02/2020   PCP:  Patient, No Pcp Per Pharmacy:   [id number] [id number]: [phone]: [phone]    Social Determinants of Health (SDOH) Social History: SDOH Screenings   Tobacco Use: High Risk (10/30/2022)   SDOH Interventions:     Readmission Risk Interventions     No data to display

## 2022-11-03 NOTE — Progress Notes (Signed)
PROGRESS NOTE    [name]  [id number] DOB: [birth year] DOA: 10/30/2022 PCP: Patient, No Pcp Per    Brief Narrative:   [name] is a 47 y.o. 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 Lucia Marshall 2022 who presented to [facility] ED with persistent erythema, swelling, and pain to his left lower extremity.  Patient has been treated for cellulitis outpatient with a course of doxycycline and Keflex without improvement.  Also reports now ulceration to ankle.  Denies history of diabetes.  In the ED, temperature 99.0 F, HR 105, RR 20, BP 140/70, SpO2 96% on room air.  WBC 13.2, hemoglobin 16.4, platelets 210.  Sodium 137, potassium 3.9, chloride 104, CO2 24, glucose 130, BUN 15, creatinine 1.02.  AST 22, ALT 25, total bilirubin 0.7.  Lactic acid 1.6.  X-ray left foot/tibia/fibula with soft tissue swelling left lower leg, foot and ankle, intact ankle fixation hardware without evidence of complication, no evidence of acute osseous abnormality.  Blood cultures x 2 obtained.  Orthopedics consulted.  Patient was started on IV vancomycin/ceftriaxone/Flagyl.  TRH consulted for further evaluation management of persistent right lower extremity cellulitis with concern for possible deep infection with failed outpatient antibiotic treatment.  Assessment & Plan:   Sepsis, POA Streptococcus septicemia Right lower extremity cellulitis with possible sinus tract Patient presenting to ED with persistent erythema, edema, pain to right lower extremity despite course of outpatient Keflex and doxycycline.  Complicated by his history of left ankle fracture s/p arthrocentesis 2022 by Dr. Lucia Marshall.  Patient with tachycardia, elevated WBC count of 13.2 on admission.  Lactic acid 1.2.  Hemoglobin A1c 5.8.  MR left ankle with and without contrast with  subcutaneous edema, small skin wound medial distal tibial metaphysis, suspected sinus tract extending to bone with mild surrounding peripheral enhancement consistent with soft tissue infection, no drainable fluid collection identified, no evidence of osteomyelitis.  Seen by orthopedics, no surgical indication at this time; with consideration of hardware removal outpatient once infection clears.  ABIs within normal limits. -- Orthopedics, infectious disease following, appreciate assistance -- WBC 13.2>16.5>15.7>11.1>10.6 -- CRP 6.8 -- Blood cultures 1/4:  Streptococcus group D, resistant to clindamycin/erythromycin -- Blood Cultures 2/17: No growth x 2 days -- Ceftriaxone 2 g IV every 24 hours -- Oxycodone 5-10 mg PO q4h PRN moderate pain -- Dilaudid 0.5 mg IV every 2 hours as needed severe pain -- CT left ankle with contrast today for further evaluation of possible sinus tract -- CBC daily  Hx essential hypertension Currently not on antihypertensive therapy. -- Hydralazine 36m IV q4h PRN SBP >180 -- Continue monitor BP closely  Tobacco use disorder Counseled on need for complete cessation/abstinence. -- Nicotine patch  Obesity Body mass index is 36.94 kg/m.  Discussed with patient needs for aggressive lifestyle changes/weight loss as this complicates all facets of care.  Outpatient follow-up with PCP.  May benefit from bariatric evaluation outpatient.   DVT prophylaxis: heparin injection 5,000 Units Start: 10/30/22 1400    Code Status: Full Code Family Communication: No family present bedside this morning  Disposition Plan:  Level of care: Med-Surg Status is: Inpatient Remains inpatient appropriate because: IV antibiotics, CT left ankle today, further per orthopedic/ID.  Consultants:  Infectious disease Orthopedic surgery  Procedures:  None  Antimicrobials:  Vancomycin 2/15 - 2/15 Cefepime 2/15 - 2/15 Linezolid 2/16 - 2/18 Ceftriaxone 2/15>>  Metronidazole 2/15 -  2/18   Subjective: Patient seen examined bedside, resting comfortably.  Lying in bed.  Reports swelling slightly improved but still unable to bear any weight to his left lower extremity.  Awaiting CT left ankle for further evaluation of possible sinus tract per recommendations of infectious disease.  WBC count continues to improve and remains afebrile.  No other specific complaints or concerns at this time.  Denies headache, no dizziness, no chest pain, no palpitations, no fever, no chills/night sweats, no nausea/vomiting/diarrhea, no focal weakness, no fatigue, no paresthesias.  No acute events overnight per nursing staff.  Objective: Vitals:   11/02/22 1620 11/02/22 2044 11/03/22 0447 11/03/22 0748  BP: (!) 151/81 130/68 123/74 123/81  Pulse: 87 85 82 77  Resp: 18 18 20 16  $ Temp: 98 F (36.7 C) 98.2 F (36.8 C) 98.5 F (36.9 C) 98.4 F (36.9 C)  TempSrc: Oral Oral Oral Oral  SpO2: 98% 98% 97% 96%  Weight:      Height:        Intake/Output Summary (Last 24 hours) at 11/03/2022 1017 Last data filed at 11/03/2022 0600 Gross per 24 hour  Intake 2613.32 ml  Output 2500 ml  Net 113.32 ml   Filed Weights   10/30/22 0903  Weight: 127 kg    Examination:  Physical Exam: GEN: NAD, alert and oriented x 3, obese HEENT: NCAT, PERRL, EOMI, sclera clear, MMM PULM: CTAB w/o wheezes/crackles, normal respiratory effort, on room air CV: RRR w/o M/G/R GI: abd soft, NTND, NABS, no R/G/M MSK: Noted edema, erythema and TTP to palpation in the left ankle/lower left leg, Ace wrap in place, moves all extremities independently NEURO: CN II-XII intact, no focal deficits, sensation to light touch intact PSYCH: normal mood/affect Integumentary: Edema, erythema noted to left lower extremity as depicted below with Ace wrap in place, otherwise no other concerning rashes/lesions/wounds noted on exposed skin surfaces.           Data Reviewed: I have personally reviewed following labs and imaging  studies  CBC: Recent Labs  Lab 10/30/22 0911 10/31/22 0411 11/01/22 0618 11/02/22 0619 11/03/22 0424  WBC 13.2* 16.5* 15.7* 11.1* 10.6*  NEUTROABS 10.4*  --   --   --   --   HGB 16.4 14.2 13.8 13.4 14.7  HCT 46.6 41.1 39.3 38.9* 43.4  MCV 88.4 89.2 88.1 88.0 89.5  PLT 210 174 175 198 [id number]   Basic Metabolic Panel: Recent Labs  Lab 10/30/22 0911 10/31/22 0411 11/01/22 0618 11/02/22 0619 11/03/22 0424  NA 137 131* 129* 131* 134*  K 3.9 3.9 3.8 4.3 4.2  CL 104 98 98 99 95*  CO2 24 24 23 24 26  $ GLUCOSE 130* 115* 103* 116* 122*  BUN 15 9 9 11 14  $ CREATININE 1.02 0.94 0.86 0.94 1.00  CALCIUM 9.1 8.2* 8.2* 8.4* 9.0   GFR: Estimated Creatinine Clearance: 126.1 mL/min (by C-G formula based on SCr of 1 mg/dL). Liver Function Tests: Recent Labs  Lab 10/30/22 0911  AST 22  ALT 25  ALKPHOS 75  BILITOT 0.7  PROT 7.6  ALBUMIN 3.7   No results for input(s): "LIPASE", "AMYLASE" in the last 168 hours. No results for input(s): "AMMONIA" in the last 168 hours. Coagulation Profile: No results for input(s): "INR", "PROTIME" in the last 168 hours. Cardiac Enzymes: No results for input(s): "CKTOTAL", "CKMB", "CKMBINDEX", "TROPONINI" in the last 168 hours. BNP (last 3 results) No results for input(s): "PROBNP" in the last 8760 hours.  HbA1C: No results for input(s): "HGBA1C" in the last 72 hours.  CBG: No results for input(s): "GLUCAP" in the last 168 hours. Lipid Profile: No results for input(s): "CHOL", "HDL", "LDLCALC", "TRIG", "CHOLHDL", "LDLDIRECT" in the last 72 hours. Thyroid Function Tests: No results for input(s): "TSH", "T4TOTAL", "FREET4", "T3FREE", "THYROIDAB" in the last 72 hours. Anemia Panel: No results for input(s): "VITAMINB12", "FOLATE", "FERRITIN", "TIBC", "IRON", "RETICCTPCT" in the last 72 hours. Sepsis Labs: Recent Labs  Lab 10/30/22 0911 10/30/22 1145  LATICACIDVEN 1.6 1.2    Recent Results (from the past 240 hour(s))  Culture, blood (routine x 2)      Status: Abnormal   Collection Time: 10/30/22 10:00 AM   Specimen: BLOOD RIGHT FOREARM  Result Value Ref Range Status   Specimen Description BLOOD RIGHT FOREARM  Final   Special Requests   Final    BOTTLES DRAWN AEROBIC AND ANAEROBIC Blood Culture adequate volume   Culture  Setup Time   Final    GRAM POSITIVE COCCI AEROBIC BOTTLE ONLY CRITICAL RESULT CALLED TO, READ BACK BY AND VERIFIED WITH: T RUDISILL,PHARMD@0152$  10/31/22 [place] Performed at [facility], [place] [address] [place] [address], [place], [place] [address]    Culture STREPTOCOCCUS GROUP G (A)  Final   Report Status 11/02/2022 FINAL  Final   Organism ID, Bacteria STREPTOCOCCUS GROUP G  Final      Susceptibility   Streptococcus group g - MIC*    CLINDAMYCIN >=1 RESISTANT Resistant     AMPICILLIN <=0.25 SENSITIVE Sensitive     ERYTHROMYCIN >=8 RESISTANT Resistant     VANCOMYCIN 0.5 SENSITIVE Sensitive     CEFTRIAXONE <=0.12 SENSITIVE Sensitive     LEVOFLOXACIN 0.5 SENSITIVE Sensitive     PENICILLIN <=0.06 SENSITIVE Sensitive     * STREPTOCOCCUS GROUP G  Blood Culture ID Panel (Reflexed)     Status: Abnormal   Collection Time: 10/30/22 10:00 AM  Result Value Ref Range Status   Enterococcus faecalis NOT DETECTED NOT DETECTED Final   Enterococcus Faecium NOT DETECTED NOT DETECTED Final   Listeria monocytogenes NOT DETECTED NOT DETECTED Final   Staphylococcus species NOT DETECTED NOT DETECTED Final   Staphylococcus aureus (BCID) NOT DETECTED NOT DETECTED Final   Staphylococcus epidermidis NOT DETECTED NOT DETECTED Final   Staphylococcus lugdunensis NOT DETECTED NOT DETECTED Final   Streptococcus species DETECTED (A) NOT DETECTED Final    Comment: Not Enterococcus species, Streptococcus agalactiae, Streptococcus pyogenes, or Streptococcus pneumoniae. CRITICAL RESULT CALLED TO, READ BACK BY AND VERIFIED WITH: T RUDISILL,PHARMD@0152$  10/31/22 [place]    Streptococcus agalactiae NOT DETECTED NOT DETECTED Final   Streptococcus pneumoniae NOT  DETECTED NOT DETECTED Final   Streptococcus pyogenes NOT DETECTED NOT DETECTED Final   A.calcoaceticus-baumannii NOT DETECTED NOT DETECTED Final   Bacteroides fragilis NOT DETECTED NOT DETECTED Final   Enterobacterales NOT DETECTED NOT DETECTED Final   Enterobacter cloacae complex NOT DETECTED NOT DETECTED Final   Escherichia coli NOT DETECTED NOT DETECTED Final   Klebsiella aerogenes NOT DETECTED NOT DETECTED Final   Klebsiella oxytoca NOT DETECTED NOT DETECTED Final   Klebsiella pneumoniae NOT DETECTED NOT DETECTED Final   Proteus species NOT DETECTED NOT DETECTED Final   Salmonella species NOT DETECTED NOT DETECTED Final   Serratia marcescens NOT DETECTED NOT DETECTED Final   Haemophilus influenzae NOT DETECTED NOT DETECTED Final   Neisseria meningitidis NOT DETECTED NOT DETECTED Final   Pseudomonas aeruginosa NOT DETECTED NOT DETECTED Final   Stenotrophomonas maltophilia NOT DETECTED NOT DETECTED Final   Candida albicans  NOT DETECTED NOT DETECTED Final   Candida auris NOT DETECTED NOT DETECTED Final   Candida glabrata NOT DETECTED NOT DETECTED Final   Candida krusei NOT DETECTED NOT DETECTED Final   Candida parapsilosis NOT DETECTED NOT DETECTED Final   Candida tropicalis NOT DETECTED NOT DETECTED Final   Cryptococcus neoformans/gattii NOT DETECTED NOT DETECTED Final    Comment: Performed at [facility], [place] [address], [place], [place] [address]  Culture, blood (routine x 2)     Status: None (Preliminary result)   Collection Time: 10/30/22 10:07 AM   Specimen: BLOOD LEFT FOREARM  Result Value Ref Range Status   Specimen Description BLOOD LEFT FOREARM  Final   Special Requests   Final    BOTTLES DRAWN AEROBIC AND ANAEROBIC Blood Culture adequate volume   Culture   Final    NO GROWTH 4 DAYS Performed at [facility], [place] [address], [place], [place] [address]    Report Status PENDING  Incomplete  Culture, blood (Routine X 2) w Reflex to ID Panel     Status: None  (Preliminary result)   Collection Time: 11/01/22  6:30 AM   Specimen: BLOOD LEFT ARM  Result Value Ref Range Status   Specimen Description BLOOD LEFT ARM  Final   Special Requests   Final    BOTTLES DRAWN AEROBIC AND ANAEROBIC Blood Culture results may not be optimal due to an excessive volume of blood received in culture bottles   Culture   Final    NO GROWTH 2 DAYS Performed at [facility], [place] [address], [place], [place] [address]    Report Status PENDING  Incomplete  Culture, blood (Routine X 2) w Reflex to ID Panel     Status: None (Preliminary result)   Collection Time: 11/01/22  6:30 AM   Specimen: BLOOD RIGHT ARM  Result Value Ref Range Status   Specimen Description BLOOD RIGHT ARM  Final   Special Requests   Final    BOTTLES DRAWN AEROBIC AND ANAEROBIC Blood Culture results may not be optimal due to an excessive volume of blood received in culture bottles   Culture   Final    NO GROWTH 2 DAYS Performed at [facility], [place] [address] [place], [place] [address]    Report Status PENDING  Incomplete         Radiology Studies: No results found.      Scheduled Meds:  docusate sodium  100 mg Oral BID   feeding supplement  237 mL Oral BID BM   heparin  5,000 Units Subcutaneous Q8H   multivitamin with minerals  1 tablet Oral Daily   nicotine  14 mg Transdermal Daily   nutrition supplement (JUVEN)  1 packet Oral BID BM   Continuous Infusions:  cefTRIAXone (ROCEPHIN)  IV       LOS: 4 days    Time spent: 49 minutes spent on chart review, discussion with nursing staff, consultants, updating family and interview/physical exam; more than 50% of that time was spent in counseling and/or coordination of care.    Yexalen Deike J [place] DO [facility] Available via Epic secure chat 7am-7pm After these hours, please refer to coverage provider listed on [email or web address] 11/03/2022, 10:17 AM

## 2022-11-03 NOTE — Progress Notes (Signed)
     [name] is a 47 y.o. male   Orthopaedic diagnosis: Left lower leg cellulitis in the setting of prior left ankle arthrodesis  Subjective: Patient seen and evaluated this morning. He notes mild improvement in his pain since admission.  He persists with significant erythema about the left lower extremity.  He is on IV antibiotics per infectious disease.  He tells me there has been consideration of a PICC line for discharge.  His white blood cell count It is trending down but again he persists with significant erythema and pain in the left lower extremity.  He is keeping it wrapped as instructed.  He has not been able to bear much weight. He has been using a walker.  Primary team has ordered CT scan of the left ankle for further evaluation of previously known sinus tract from lag screw on MRI scan.  Recall, prior MRI scan was without evidence of deep fluid collection or osteomyelitis. From prior discussion, he understands the current plan is for continued IV antibiotics to address the soft tissue infection with consideration of hardware removal once under control. 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   Objectyive: Vitals:   11/03/22 0447 11/03/22 0748  BP: 123/74 123/81  Pulse: 82 77  Resp: 20 16  Temp: 98.5 F (36.9 C) 98.4 F (36.9 C)  SpO2: 97% 96%     Exam: Awake and alert Respirations even and unlabored No acute distress  Left lower extremity shows persistent erythema and swelling globally about the ankle and extends to the level of the midcalf.  There is perhaps some mild interval improvement but not much.  Small draining wound about the medial ankle at the level of the lag screw appears unchanged.  There is no obvious purulence.  He has significant tenderness and hypersensitivity to palpation throughout the ankle and lower leg.  Is able to wiggle the toes. The ankle appears well fused to attempted passive manipulation but does elicit discomfort.   Calf is soft and nontender proximally.  Sensation grossly intact.  Warm and well-perfused distally.    Assessment: Left lower extremity cellulitis in the setting of prior left ankle arthrodesis in 2022, with small sinus tract from medial lag screw by MRI and CT scan without evidence of osteomyelitis or deep fluid collection  Plan: We had a lengthy discussion again today.  Fortunately by prior MRI and repeat CT scan ordered by primary team, he has no evidence of deep fluid collection or osteomyelitis that may preclude him from improving with IV antibiotics. There are presently no indications that would necessitate urgent operative intervention.  Certainly do think that the hardware may be contributing to this infection and we would plan for removal in the outpatient setting once his soft tissue infection and cellulitis has improved.  I would hope for more improvement with IV antibiotics but his progress seems to be slow.  We discussed potential vascular etiology or venous stasis that may be contributing as well once again. We will continue to follow for the time being for any improvement or changes. He may continue with daily dressing changes and monitoring.  He may be weightbearing as tolerated with walker.    Jasminne Mealy J. [place], PA-C

## 2022-11-03 NOTE — Progress Notes (Signed)
PHARMACY CONSULT NOTE FOR:  OUTPATIENT  PARENTERAL ANTIBIOTIC THERAPY (OPAT)  Indication: L-ankle infection Regimen: Rocephin 2g IV every 24 hours End date: 11/29/22 (4 weeks from negative blood cultures on 11/01/22)  IV antibiotic discharge orders are pended. To discharging provider:  please sign these orders via discharge navigator,  Select New Orders & click on the button choice - Manage This Unsigned Work.     Thank you for allowing pharmacy to be a part of this patient's care.  Alycia Rossetti, PharmD, BCPS Infectious Diseases Clinical Pharmacist 11/04/2022 9:48 AM   **Pharmacist phone directory can now be found on [email or web address] ([email or web address]).  Listed under [place].

## 2022-11-04 ENCOUNTER — Inpatient Hospital Stay: Payer: Self-pay

## 2022-11-04 DIAGNOSIS — B955 Unspecified streptococcus as the cause of diseases classified elsewhere: Secondary | ICD-10-CM | POA: Diagnosis not present

## 2022-11-04 DIAGNOSIS — L03119 Cellulitis of unspecified part of limb: Secondary | ICD-10-CM | POA: Diagnosis not present

## 2022-11-04 DIAGNOSIS — L03116 Cellulitis of left lower limb: Secondary | ICD-10-CM | POA: Diagnosis not present

## 2022-11-04 DIAGNOSIS — L02419 Cutaneous abscess of limb, unspecified: Secondary | ICD-10-CM | POA: Diagnosis not present

## 2022-11-04 DIAGNOSIS — R7881 Bacteremia: Secondary | ICD-10-CM | POA: Diagnosis not present

## 2022-11-04 LAB — CBC
HCT: 42.6 % (ref 39.0–52.0)
Hemoglobin: 14 g/dL (ref 13.0–17.0)
MCH: 29.8 pg (ref 26.0–34.0)
MCHC: 32.9 g/dL (ref 30.0–36.0)
MCV: 90.6 fL (ref 80.0–100.0)
Platelets: 248 10*3/uL (ref 150–400)
RBC: 4.7 MIL/uL (ref 4.22–5.81)
RDW: 12.4 % (ref 11.5–15.5)
WBC: 9.1 10*3/uL (ref 4.0–10.5)
nRBC: 0 % (ref 0.0–0.2)

## 2022-11-04 LAB — CULTURE, BLOOD (ROUTINE X 2)
Culture: NO GROWTH
Special Requests: ADEQUATE

## 2022-11-04 LAB — BASIC METABOLIC PANEL
Anion gap: 9 (ref 5–15)
BUN: 16 mg/dL (ref 6–20)
CO2: 26 mmol/L (ref 22–32)
Calcium: 8.8 mg/dL — ABNORMAL LOW (ref 8.9–10.3)
Chloride: 99 mmol/L (ref 98–111)
Creatinine, Ser: 0.97 mg/dL (ref 0.61–1.24)
GFR, Estimated: >60 mL/min (ref >60)
Glucose, Bld: 120 mg/dL — ABNORMAL HIGH (ref 70–99)
Potassium: 4.8 mmol/L (ref 3.5–5.1)
Sodium: 134 mmol/L — ABNORMAL LOW (ref 135–145)

## 2022-11-04 MED ORDER — CHLORHEXIDINE GLUCONATE CLOTH 2 % EX PADS: 6.0000 | MEDICATED_PAD | Freq: Every day | CUTANEOUS | Status: DC

## 2022-11-04 MED ORDER — SODIUM CHLORIDE 0.9% FLUSH: 10.0000 mL | INTRAVENOUS | Status: DC | PRN

## 2022-11-04 MED ORDER — CEFTRIAXONE IV (FOR PTA / DISCHARGE USE ONLY): 2.0000 g | INTRAVENOUS | 0 refills | Status: AC

## 2022-11-04 NOTE — TOC Progression Note (Addendum)
Transition of Care [facility]) - Progression Note    Patient Details  Name: [name] MRN: [id number] Date of Birth: 12-Dec-1975  [facility]) CM/SW Contact  Jacalyn Lefevre Edson Snowball, RN Phone Number: 11/04/2022, 12:52 PM  Clinical Narrative:      Plan for discharge today once PICC line placed. Pam with [employer] updated . She will see patient here at hospital prior to his discharge. Secure chatted patient's nurse Early Osmond was returned to [place]  Expected Discharge Plan: [place] Barriers to Discharge: Continued Medical Work up  Expected Discharge Plan and Services   Discharge Planning Services: CM Consult Post Acute Care Choice: Home Health, Durable Medical Equipment Living arrangements for the past 2 months: Single Family Home Expected Discharge Date: 11/04/22               DME Arranged: Gilford Rile rolling DME Agency: [employer] Date DME Agency Contacted: 11/03/22 Time DME Agency Contacted: [phone] Representative spoke with at DME Agency: Erasmo Downer HH Arranged: RN [place] Agency:  Ysidro Evert and Product/process development scientist)         Social Determinants of Health ([place]) Interventions SDOH Screenings   Tobacco Use: High Risk (10/30/2022)    Readmission Risk Interventions     No data to display

## 2022-11-04 NOTE — Plan of Care (Signed)
Discharging home

## 2022-11-04 NOTE — Discharge Summary (Addendum)
Physician Discharge Summary  [name] [id number] DOB: 02/11/1976 DOA: 10/30/2022  PCP: Patient, No Pcp Per  Admit date: 10/30/2022 Discharge date: 11/04/2022  Admitted From: Home Disposition: Home  Recommendations for Outpatient Follow-up:  Follow up with PCP in 1-2 weeks Follow-up with orthopedics, Dr. Lucia Gaskins as scheduled For with infectious disease, Dr. West Bali on 11/19/2022 at 2:30 PM CBC with differential and BMP every week, ESR/CRP every other week Discharging on IV antibiotics with ceftriaxone x 4 weeks per ID Declined narcotics on discharge  Home Health: Home health RN Equipment/Devices: PICC line.  Offered walker, declined.  Patient has crutches at home  Discharge Condition: Stable CODE STATUS: Full code Diet recommendation:   History of present illness:  [name] is a 47 y.o. 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 Lucia Gaskins 2022 who presented to [facility] ED with persistent erythema, swelling, and pain to his left lower extremity.  Patient has been treated for cellulitis outpatient with a course of doxycycline and Keflex without improvement.  Also reports now ulceration to ankle.  Denies history of diabetes.   In the ED, temperature 99.0 F, HR 105, RR 20, BP 140/70, SpO2 96% on room air.  WBC 13.2, hemoglobin 16.4, platelets 210.  Sodium 137, potassium 3.9, chloride 104, CO2 24, glucose 130, BUN 15, creatinine 1.02.  AST 22, ALT 25, total bilirubin 0.7.  Lactic acid 1.6.  X-ray left foot/tibia/fibula with soft tissue swelling left lower leg, foot and ankle, intact ankle fixation hardware without evidence of complication, no evidence of acute osseous abnormality.  Blood cultures x 2 obtained.  Orthopedics consulted.  Patient was started on IV vancomycin/ceftriaxone/Flagyl.  TRH consulted  for further evaluation management of persistent right lower extremity cellulitis with concern for possible deep infection with failed outpatient antibiotic treatment.  Hospital course:  Sepsis, POA Streptococcus septicemia Left lower extremity cellulitis with possible sinus tract Patient presenting to ED with persistent erythema, edema, pain to right lower extremity despite course of outpatient Keflex and doxycycline.  Complicated by his history of left ankle fracture s/p arthrocentesis 2022 by Dr. Lucia Gaskins.  Patient with tachycardia, elevated WBC count of 13.2 on admission.  Lactic acid 1.2. Hemoglobin A1c 5.8.  Blood cultures on admission positive for Streptococcus. MR left ankle with and without contrast with subcutaneous edema, small skin wound medial distal tibial metaphysis, suspected sinus tract extending to bone with mild surrounding peripheral enhancement consistent with soft tissue infection, no drainable fluid collection identified, no evidence of osteomyelitis.  ABIs within normal limits.  CT left ankle with contrast with small amount of fluid within the subcu soft tissues at the medial aspect of the lower leg, small sinus track without erosion or aggressive periosteal elevation to suggest acute osteomyelitis, generalized soft tissue edema with no organized fluid collections or soft tissue gas. Seen by orthopedics, no surgical indication at this time; with consideration of hardware removal outpatient once infection clears.  Patient was initially started on empiric antibiotics with vancomycin, cefepime which were de-escalated to ceftriaxone based on culture sensitivity.  Repeat blood cultures remain negative to date at time of discharge.  Will continue ceftriaxone 2 g IV every 24 hours on discharge via PICC line for 4 weeks of therapy from negative blood culture date of 2/17.  Outpatient follow-up with orthopedics and ID.  Patient declined walker and narcotic pain medication at time of  discharge.    Hx essential hypertension Currently not on antihypertensive therapy.   Tobacco use disorder Counseled on need for complete cessation/abstinence.   Obesity Body mass index is 36.94 kg/m.  Discussed with patient needs for aggressive lifestyle changes/weight loss as this complicates all facets of care.  Outpatient follow-up with PCP.  May benefit from bariatric evaluation outpatient.  Discharge Diagnoses:  Principal Problem:   Cellulitis and abscess of leg Active Problems:   Essential hypertension   Tobacco dependence   Bacteremia due to Streptococcus   Cellulitis of left lower extremity    Discharge Instructions  Discharge Instructions     [facility] pharmacist to adjust dose for Vancomycin, Aminoglycosides and other anti-infective therapies as requested by physician.   Complete by: As directed    [facility] to provide Cath Flo '2mg'$    Complete by: As directed    Administer for PICC line occlusion and as ordered by physician for other access device issues.   Anaphylaxis Kit: Provided to treat any anaphylactic reaction to the medication being provided to the patient if First Dose or when requested by physician   Complete by: As directed    Epinephrine '1mg'$ /ml vial / amp: Administer 0.'3mg'$  (0.57m) subcutaneously once for moderate to severe anaphylaxis, nurse to call physician and pharmacy when reaction occurs and call 911 if needed for immediate care   Diphenhydramine '50mg'$ /ml IV vial: Administer 25-'50mg'$  IV/IM PRN for first dose reaction, rash, itching, mild reaction, nurse to call physician and pharmacy when reaction occurs   Sodium Chloride 0.9% NS 5058mIV: Administer if needed for hypovolemic blood pressure drop or as ordered by physician after call to physician with anaphylactic reaction   Call MD for:  difficulty breathing, headache or visual disturbances   Complete by: As directed    Call MD for:  extreme fatigue   Complete by: As directed    Call MD  for:  persistant dizziness or light-headedness   Complete by: As directed    Call MD for:  persistant nausea and vomiting   Complete by: As directed    Call MD for:  severe uncontrolled pain   Complete by: As directed    Call MD for:  temperature >100.4   Complete by: As directed    Change dressing on IV access line weekly and PRN   Complete by: As directed    Diet - low sodium heart healthy   Complete by: As directed    Discharge wound care:   Complete by: As directed    Cleanse left leg with soap and water.  Apply Xeroform to scabbed lesion and any moist weeping areas.  Wrap leg with kerlix and secure with ace bandage  Change daily.   Flush IV access with Sodium Chloride 0.9% and Heparin 10 units/ml or 100 units/ml   Complete by: As directed    Home infusion instructions - [facility]   Complete by: As directed    Instructions: Flush IV access with Sodium Chloride 0.9% and Heparin 10units/ml or 100units/ml   Change dressing on IV access line: Weekly and PRN   Instructions Cath Flo '2mg'$ : Administer for PICC Line occlusion and as ordered by physician for other access device   [facility] pharmacist to adjust dose for: Vancomycin, Aminoglycosides and other anti-infective therapies as requested by physician   Increase activity slowly   Complete by: As directed    Method of administration may be changed at the discretion of home infusion pharmacist  based upon assessment of the patient and/or caregiver's ability to self-administer the medication ordered   Complete by: As directed       Allergies as of 11/04/2022   No Active Allergies      Medication List     TAKE these medications    benzonatate 100 MG capsule Commonly known as: TESSALON Take 1 capsule (100 mg total) by mouth every 8 (eight) hours.   cefTRIAXone  IVPB Commonly known as: ROCEPHIN Inject 2 g into the vein daily for 25 days. Indication:  L-ankle infection First Dose: Yes Last Day of  Therapy:  11/29/22 Labs - Once weekly:  CBC/D and BMP, Labs - Every other week:  ESR and CRP Method of administration: IV Push Pull PICC line at the completion of IV therayp Method of administration may be changed at the discretion of home infusion pharmacist based upon assessment of the patient and/or caregiver's ability to self-administer the medication ordered.   ibuprofen 200 MG tablet Commonly known as: ADVIL Take 800 mg by mouth every 8 (eight) hours as needed for headache, mild pain or moderate pain.   oxymetazoline 0.05 % nasal spray Commonly known as: AFRIN Place 1-2 sprays into both nostrils at bedtime as needed for congestion.   urea 40 % Crea Commonly known as: CARMOL Apply 1 application  topically daily.               Durable Medical Equipment  (From admission, onward)           Start     Ordered   11/03/22 1258  For home use only DME Walker rolling  Once       Question Answer Comment  Walker: With 5 Inch Wheels   Patient needs a walker to treat with the following condition Weakness      11/03/22 1257              Discharge Care Instructions  (From admission, onward)           Start     Ordered   11/04/22 0000  Change dressing on IV access line weekly and PRN  (Home infusion instructions - [facility] )        11/04/22 1226   11/04/22 0000  Discharge wound care:       Comments: Cleanse left leg with soap and water.  Apply Xeroform to scabbed lesion and any moist weeping areas.  Wrap leg with kerlix and secure with ace bandage  Change daily.   11/04/22 1226            Follow-up Information     Riesa Pope, MD Follow up.   Specialty: Internal Medicine Why: November 11, 2022 at 3:45 pm   Ground floor [facility], use [facility] information: [place] [address] [phone]         Erle Crocker, MD. Schedule an appointment as soon as possible for a visit.    Specialty: Orthopedic Surgery Contact information: [place] [address] [phone]         Rosiland Oz, MD. Go on 11/19/2022.   Specialty: Infectious Diseases Why: 2:30pm Contact information: [place] [address] [phone]                No Active Allergies  Consultations: Orthopedics Infectious disease   Procedures/Studies: [place] EKG SITE RITE  Result Date: 11/04/2022 If [facility] image not attached, placement  could not be confirmed due to current cardiac rhythm.  CT ANKLE LEFT W CONTRAST  Result Date: 11/03/2022 CLINICAL DATA:  Soft tissue infection suspected, ankle, xray done EXAM: CT OF THE LEFT ANKLE WITH CONTRAST TECHNIQUE: Multidetector CT imaging of the left ankle was performed following the standard protocol during bolus administration of intravenous contrast. RADIATION DOSE REDUCTION: This exam was performed according to the departmental dose-optimization program which includes automated exposure control, adjustment of the mA and/or kV according to patient size and/or use of iterative reconstruction technique. CONTRAST:  42m OMNIPAQUE IOHEXOL 350 MG/ML SOLN COMPARISON:  X-ray 10/30/2022, MRI 10/31/2022 FINDINGS: Bones/Joint/Cartilage Redemonstrated postsurgical changes from prior distal tibial and fibular ORIF and tibiotalar arthrodesis. There is solid fusion across the tibiotalar joint space. Intact hardware without evidence of loosening. No evidence of an acute fracture. No dislocation. No appreciable tibiotalar joint effusion. Ligaments Suboptimally assessed by CT. Muscles and Tendons Ankle tendons are grossly intact by CT. No appreciable tenosynovial fluid collection. Generalized muscle atrophy. No intramuscular fluid collections are seen. Soft tissues Small amount of fluid within the subcutaneous soft tissues at the medial aspect of the lower leg at the level of the distal tibial metaphysis measuring  approximately 13 x 9 x 13 mm (series 4, image 95; series 9, image 69). A small sinus tract extending to the underlying tibial cortex was better seen by previous MRI. No erosion or aggressive periosteal elevation at this location to suggest acute osteomyelitis. Generalized soft tissue edema and fluid. No additional organized fluid collections are evident. No soft tissue gas. IMPRESSION: 1. Small amount of fluid within the subcutaneous soft tissues at the medial aspect of the lower leg at the level of the distal tibial metaphysis measuring approximately 13 x 9 x 13 mm. A small sinus tract extending to the underlying tibial cortex was better seen by previous MRI. No erosion or aggressive periosteal elevation at this location to suggest acute osteomyelitis. 2. Generalized soft tissue edema and fluid. No additional organized fluid collections are evident. No soft tissue gas. 3. Redemonstrated postsurgical changes from prior distal tibial and fibular ORIF and tibiotalar arthrodesis. Electronically Signed   By: NDavina PokeD.O.   On: 11/03/2022 14:05   VAS [place] WITH/WO TBI  Result Date: 10/31/2022  LOWER EXTREMITY DOPPLER STUDY Patient Name:  JTRADD SERGI Date of Exam:   10/31/2022 Medical Rec #: [id number]          Accession #:    [id number] of Birth: [birth year]           Patient Gender: M Patient Age:   89years [facility]      [facility] [place] WITH/WO TBI Referring Phys: JAnderson MaltaYATES --------------------------------------------------------------------------------  Indications: Cellultis w/ ulceration of LLE (ankle) High Risk Factors: Hypertension, current smoker. Other Factors: Left ankle fracture 09/2020 requiring ORIF complicated by                avascular necrosis followed by left ankle arthrodesis (06/2021).  Comparison Study: No previous exams Performing Technologist: Hill, Jody RVT, RDMS  Examination Guidelines: A complete evaluation includes at minimum, Doppler  waveform signals and systolic blood pressure reading at the level of bilateral brachial, anterior tibial, and posterior tibial arteries, when vessel segments are accessible. Bilateral testing is considered an integral part of a complete examination. Photoelectric Plethysmograph (PPG) waveforms and toe systolic pressure readings are included as required and additional duplex testing as needed. Limited examinations for reoccurring  indications may be performed as noted.  ABI Findings: +---------+------------------+-----+---------+--------+ Right    Rt Pressure (mmHg)IndexWaveform Comment  +---------+------------------+-----+---------+--------+ Brachial 150                    triphasic         +---------+------------------+-----+---------+--------+ PTA      155               1.03 triphasic         +---------+------------------+-----+---------+--------+ DP       167               1.11 triphasic         +---------+------------------+-----+---------+--------+ Great Toe171               1.14 Normal            +---------+------------------+-----+---------+--------+ +---------+------------------+-----+---------+-------+ Left     Lt Pressure (mmHg)IndexWaveform Comment +---------+------------------+-----+---------+-------+ Brachial 148                    triphasic        +---------+------------------+-----+---------+-------+ PTA      164               1.09 triphasic        +---------+------------------+-----+---------+-------+ DP       167               1.11 triphasic        +---------+------------------+-----+---------+-------+ Great Toe107               0.71 Normal           +---------+------------------+-----+---------+-------+ +-------+-----------+-----------+------------+------------+ ABI/TBIToday's ABIToday's TBIPrevious ABIPrevious TBI +-------+-----------+-----------+------------+------------+ Right  1.11       1.14                                 +-------+-----------+-----------+------------+------------+ Left   1.11       0.71                                +-------+-----------+-----------+------------+------------+   Summary: Right: Resting right ankle-brachial index is within normal range. The right toe-brachial index is normal. Left: Resting left ankle-brachial index is within normal range. The left toe-brachial index is normal. *See table(s) above for measurements and observations.  Electronically signed by Deitra Mayo MD on 10/31/2022 at 5:06:52 PM.    Final    MR ANKLE LEFT W WO CONTRAST  Result Date: 10/31/2022 CLINICAL DATA:  Soft tissue infection suspected. History of ankle arthroscopy with arthrodesis 06/25/2021. EXAM: MRI OF THE LEFT ANKLE WITHOUT AND WITH CONTRAST TECHNIQUE: Multiplanar, multisequence MR imaging of the ankle was performed before and after the administration of intravenous contrast. CONTRAST:  58m GADAVIST GADOBUTROL 1 MMOL/ML IV SOLN COMPARISON:  Radiographs 10/30/2022.  CT 05/16/2021. FINDINGS: Protocol was adjusted to minimize artifact from the surgical hardware. However, the artifact is not completely eliminated, and fat saturation is suboptimal. TENDONS Peroneal: Partly obscured by artifact. Grossly intact without significant tenosynovitis. Posteromedial: Partly obscured by artifact. Grossly intact without significant tenosynovitis. Anterior: Intact and normally positioned. Achilles: Intact. Plantar Fascia: Intact. LIGAMENTS Lateral: Largely obscured by artifact. Medial: Partly obscured by artifact. No acute abnormality identified. CARTILAGE AND BONES Ankle Joint: Extensive postsurgical changes from previous ORIF of the distal tibia and fibula with subsequent tibiotalar arthrodesis. The tibiotalar ankylosis appears solid. Subtalar Joints/Sinus Tarsi: Mild subtalar arthropathy  with small nonspecific effusion. No significant inflammation or suspicious enhancement identified in the tarsal sinus. Bones: As  above, extensive postsurgical changes from previous ORIF and arthrodesis. Allowing for the artifact associated with the surgical hardware, no evidence of acute fracture, dislocation, marrow signal abnormality or abnormal enhancement. Other: Subcutaneous edema within the distal lower leg with an apparent a small skin wound medial to the distal tibial metaphysis. Suspected underlying sinus tract extending to the bone with mild surrounding peripheral enhancement, suboptimally evaluated due to artifact from the adjacent hardware. No drainable fluid collection is identified. No focal soft tissue inflammation identified at the ankle. IMPRESSION: IMPRESSION 1. Extensive postsurgical changes from previous ORIF of the distal tibia and fibula with subsequent tibiotalar arthrodesis. The tibiotalar ankylosis appears solid. 2. Subcutaneous edema within the distal lower leg with an apparent small skin wound medial to the distal tibial metaphysis. Suspected underlying sinus tract extending to the bone with mild surrounding peripheral enhancement, suspicious for soft tissue infection, suboptimally evaluated due to artifact from the adjacent hardware. No drainable fluid collection identified. 3. No evidence of osteomyelitis. 4. The ankle tendons and ligaments appear intact. 5. Given the artifact associated with the hardware, follow-up with CT may be helpful. Electronically Signed   By: Richardean Sale M.D.   On: 10/31/2022 15:58   DG Tibia/Fibula Left  Result Date: 10/30/2022 CLINICAL DATA:  infection, assess for osteo EXAM: LEFT TIBIA AND FIBULA - 2 VIEW; LEFT FOOT - COMPLETE 3+ VIEW COMPARISON:  CT 05/16/2021 FINDINGS: The proximal tibia and fibula are unremarkable. Posterior changes of distal tibia fracture ORIF with tibiotalar arthrodesis and syndesmotic fixation, as well as distal fibular intramedullary fixation. Unchanged alignment the distal fibular fracture with healed appearance and persistent cortical step-off at the  posterosuperior aspect, similar in appearance to prior ankle CT in September 2022. There appears to be solid arthrodesis of the tibiotalar joint. Intact hardware without evidence of loosening. Dorsal talar spur. There is no bone erosion or frank bony destruction. There is soft tissue swelling of the lower leg, ankle, and foot. Plantar dorsal calcaneal spurring. IMPRESSION: Soft tissue swelling of the left lower leg, foot, and ankle. Intact ankle fixation hardware without evidence of complication. No evidence of acute osseous abnormality. Electronically Signed   By: Maurine Simmering M.D.   On: 10/30/2022 10:05   DG Foot Complete Left  Result Date: 10/30/2022 CLINICAL DATA:  infection, assess for osteo EXAM: LEFT TIBIA AND FIBULA - 2 VIEW; LEFT FOOT - COMPLETE 3+ VIEW COMPARISON:  CT 05/16/2021 FINDINGS: The proximal tibia and fibula are unremarkable. Posterior changes of distal tibia fracture ORIF with tibiotalar arthrodesis and syndesmotic fixation, as well as distal fibular intramedullary fixation. Unchanged alignment the distal fibular fracture with healed appearance and persistent cortical step-off at the posterosuperior aspect, similar in appearance to prior ankle CT in September 2022. There appears to be solid arthrodesis of the tibiotalar joint. Intact hardware without evidence of loosening. Dorsal talar spur. There is no bone erosion or frank bony destruction. There is soft tissue swelling of the lower leg, ankle, and foot. Plantar dorsal calcaneal spurring. IMPRESSION: Soft tissue swelling of the left lower leg, foot, and ankle. Intact ankle fixation hardware without evidence of complication. No evidence of acute osseous abnormality. Electronically Signed   By: Maurine Simmering M.D.   On: 10/30/2022 10:05     Subjective: Patient seen examined bedside, lying in bed.  Frustrated about his continued swelling and pain to his leg.  Discussed with patient as well  as ID and orthopedics on multiple occasions that  swelling will gradually improve but will take time.  Okay for discharge from orthopedics and ID standpoint with continued IV antibiotics outpatient today.  Patient declines walker.  Also declines narcotics on discharge; "do not want to get addicted".  Discussed with patient if changes his mind can call his orthopedist office if needed.  Denies headache, no fever, no shortness of breath, no abdominal pain, no chest pain, no nausea/vomiting/diarrhea.  No acute events overnight per nursing staff.  Discharge Exam: Vitals:   11/04/22 0601 11/04/22 0843  BP: (!) 141/88 128/80  Pulse: 76 72  Resp: 18 18  Temp: 97.9 F (36.6 C) 98.6 F (37 C)  SpO2: 97% 96%   Vitals:   11/03/22 1624 11/03/22 2020 11/04/22 0601 11/04/22 0843  BP: 130/67 (!) 148/83 (!) 141/88 128/80  Pulse: 84 88 76 72  Resp: '16  18 18  '$ Temp: 98.9 F (37.2 C) 98.2 F (36.8 C) 97.9 F (36.6 C) 98.6 F (37 C)  TempSrc: Oral Oral Oral Oral  SpO2: 97% 98% 97% 96%  Weight:      Height:        Physical Exam: GEN: NAD, alert and oriented x 3, obese HEENT: NCAT, PERRL, EOMI, sclera clear, MMM PULM: CTAB w/o wheezes/crackles, normal respiratory effort CV: RRR w/o M/G/R GI: abd soft, NTND, NABS, no R/G/M MSK: Noted edema/erythema and TTP left ankle/left lower leg with dressing/Ace wrap in place, moves all extremities independently NEURO: CN II-XII intact, no focal deficits, sensation to light touch intact PSYCH: Agitated mood Integumentary: Left lower extremity with erythema, edema with dressing/Ace wrap in place, no other concerning rashes/lesions/wounds noted on exposed skin surfaces.    The results of significant diagnostics from this hospitalization (including imaging, microbiology, ancillary and laboratory) are listed below for reference.     Microbiology: Recent Results (from the past 240 hour(s))  Culture, blood (routine x 2)     Status: Abnormal   Collection Time: 10/30/22 10:00 AM   Specimen: BLOOD RIGHT FOREARM   Result Value Ref Range Status   Specimen Description BLOOD RIGHT FOREARM  Final   Special Requests   Final    BOTTLES DRAWN AEROBIC AND ANAEROBIC Blood Culture adequate volume   Culture  Setup Time   Final    GRAM POSITIVE COCCI AEROBIC BOTTLE ONLY CRITICAL RESULT CALLED TO, READ BACK BY AND VERIFIED WITH: T RUDISILL,PHARMD'@0152'$  10/31/22 Bells Performed at [facility], [address], [place], [place] [address]    Culture STREPTOCOCCUS GROUP G (A)  Final   Report Status 11/02/2022 FINAL  Final   Organism ID, Bacteria STREPTOCOCCUS GROUP G  Final      Susceptibility   Streptococcus group g - MIC*    CLINDAMYCIN >=1 RESISTANT Resistant     AMPICILLIN <=0.25 SENSITIVE Sensitive     ERYTHROMYCIN >=8 RESISTANT Resistant     VANCOMYCIN 0.5 SENSITIVE Sensitive     CEFTRIAXONE <=0.12 SENSITIVE Sensitive     LEVOFLOXACIN 0.5 SENSITIVE Sensitive     PENICILLIN <=0.06 SENSITIVE Sensitive     * STREPTOCOCCUS GROUP G  Blood Culture ID Panel (Reflexed)     Status: Abnormal   Collection Time: 10/30/22 10:00 AM  Result Value Ref Range Status   Enterococcus faecalis NOT DETECTED NOT DETECTED Final   Enterococcus Faecium NOT DETECTED NOT DETECTED Final   Listeria monocytogenes NOT DETECTED NOT DETECTED Final   Staphylococcus species NOT DETECTED NOT DETECTED Final   Staphylococcus aureus (  BCID) NOT DETECTED NOT DETECTED Final   Staphylococcus epidermidis NOT DETECTED NOT DETECTED Final   Staphylococcus lugdunensis NOT DETECTED NOT DETECTED Final   Streptococcus species DETECTED (A) NOT DETECTED Final    Comment: Not Enterococcus species, Streptococcus agalactiae, Streptococcus pyogenes, or Streptococcus pneumoniae. CRITICAL RESULT CALLED TO, READ BACK BY AND VERIFIED WITH: T RUDISILL,PHARMD'@0152'$  10/31/22 [place]    Streptococcus agalactiae NOT DETECTED NOT DETECTED Final   Streptococcus pneumoniae NOT DETECTED NOT DETECTED Final   Streptococcus pyogenes NOT DETECTED NOT DETECTED Final    A.calcoaceticus-baumannii NOT DETECTED NOT DETECTED Final   Bacteroides fragilis NOT DETECTED NOT DETECTED Final   Enterobacterales NOT DETECTED NOT DETECTED Final   Enterobacter cloacae complex NOT DETECTED NOT DETECTED Final   Escherichia coli NOT DETECTED NOT DETECTED Final   Klebsiella aerogenes NOT DETECTED NOT DETECTED Final   Klebsiella oxytoca NOT DETECTED NOT DETECTED Final   Klebsiella pneumoniae NOT DETECTED NOT DETECTED Final   Proteus species NOT DETECTED NOT DETECTED Final   Salmonella species NOT DETECTED NOT DETECTED Final   Serratia marcescens NOT DETECTED NOT DETECTED Final   Haemophilus influenzae NOT DETECTED NOT DETECTED Final   Neisseria meningitidis NOT DETECTED NOT DETECTED Final   Pseudomonas aeruginosa NOT DETECTED NOT DETECTED Final   Stenotrophomonas maltophilia NOT DETECTED NOT DETECTED Final   Candida albicans NOT DETECTED NOT DETECTED Final   Candida auris NOT DETECTED NOT DETECTED Final   Candida glabrata NOT DETECTED NOT DETECTED Final   Candida krusei NOT DETECTED NOT DETECTED Final   Candida parapsilosis NOT DETECTED NOT DETECTED Final   Candida tropicalis NOT DETECTED NOT DETECTED Final   Cryptococcus neoformans/gattii NOT DETECTED NOT DETECTED Final    Comment: Performed at [facility], [address], [place], [place] [address]  Culture, blood (routine x 2)     Status: None   Collection Time: 10/30/22 10:07 AM   Specimen: BLOOD LEFT FOREARM  Result Value Ref Range Status   Specimen Description BLOOD LEFT FOREARM  Final   Special Requests   Final    BOTTLES DRAWN AEROBIC AND ANAEROBIC Blood Culture adequate volume   Culture   Final    NO GROWTH 5 DAYS Performed at [facility], [place] [address], [place], [place] [address]    Report Status 11/04/2022 FINAL  Final  Culture, blood (Routine X 2) w Reflex to ID Panel     Status: None (Preliminary result)   Collection Time: 11/01/22  6:30 AM   Specimen: BLOOD LEFT ARM  Result Value Ref  Range Status   Specimen Description BLOOD LEFT ARM  Final   Special Requests   Final    BOTTLES DRAWN AEROBIC AND ANAEROBIC Blood Culture results may not be optimal due to an excessive volume of blood received in culture bottles   Culture   Final    NO GROWTH 3 DAYS Performed at [facility], [place] [address] [place], [place] [address]    Report Status PENDING  Incomplete  Culture, blood (Routine X 2) w Reflex to ID Panel     Status: None (Preliminary result)   Collection Time: 11/01/22  6:30 AM   Specimen: BLOOD RIGHT ARM  Result Value Ref Range Status   Specimen Description BLOOD RIGHT ARM  Final   Special Requests   Final    BOTTLES DRAWN AEROBIC AND ANAEROBIC Blood Culture results may not be optimal due to an excessive volume of blood received in culture bottles   Culture  Final    NO GROWTH 3 DAYS Performed at [facility], [place] [address], [place], [place] [address]    Report Status PENDING  Incomplete     Labs: BNP (last 3 results) No results for input(s): "BNP" in the last 8760 hours. Basic Metabolic Panel: Recent Labs  Lab 10/31/22 0411 11/01/22 0618 11/02/22 0619 11/03/22 0424 11/04/22 0050  NA 131* 129* 131* 134* 134*  K 3.9 3.8 4.3 4.2 4.8  CL 98 98 99 95* 99  CO2 '24 23 24 26 26  '$ GLUCOSE 115* 103* 116* 122* 120*  BUN '9 9 11 14 16  '$ CREATININE 0.94 0.86 0.94 1.00 0.97  CALCIUM 8.2* 8.2* 8.4* 9.0 8.8*   Liver Function Tests: Recent Labs  Lab 10/30/22 0911  AST 22  ALT 25  ALKPHOS 75  BILITOT 0.7  PROT 7.6  ALBUMIN 3.7   No results for input(s): "LIPASE", "AMYLASE" in the last 168 hours. No results for input(s): "AMMONIA" in the last 168 hours. CBC: Recent Labs  Lab 10/30/22 0911 10/31/22 0411 11/01/22 0618 11/02/22 0619 11/03/22 0424 11/04/22 0050  WBC 13.2* 16.5* 15.7* 11.1* 10.6* 9.1  NEUTROABS 10.4*  --   --   --   --   --   HGB 16.4 14.2 13.8 13.4 14.7 14.0  HCT 46.6 41.1 39.3 38.9* 43.4 42.6  MCV 88.4 89.2 88.1 88.0 89.5  90.6  PLT 210 174 175 198 256 248   Cardiac Enzymes: No results for input(s): "CKTOTAL", "CKMB", "CKMBINDEX", "TROPONINI" in the last 168 hours. BNP: Invalid input(s): "POCBNP" CBG: No results for input(s): "GLUCAP" in the last 168 hours. D-Dimer No results for input(s): "DDIMER" in the last 72 hours. Hgb A1c No results for input(s): "HGBA1C" in the last 72 hours. Lipid Profile No results for input(s): "CHOL", "HDL", "LDLCALC", "TRIG", "CHOLHDL", "LDLDIRECT" in the last 72 hours. Thyroid function studies No results for input(s): "TSH", "T4TOTAL", "T3FREE", "THYROIDAB" in the last 72 hours.  Invalid input(s): "FREET3" Anemia work up No results for input(s): "VITAMINB12", "FOLATE", "FERRITIN", "TIBC", "IRON", "RETICCTPCT" in the last 72 hours. Urinalysis No results found for: "COLORURINE", "APPEARANCEUR", "LABSPEC", "PHURINE", "GLUCOSEU", "HGBUR", "BILIRUBINUR", "KETONESUR", "PROTEINUR", "UROBILINOGEN", "NITRITE", "LEUKOCYTESUR" Sepsis Labs Recent Labs  Lab 11/01/22 0618 11/02/22 0619 11/03/22 0424 11/04/22 0050  WBC 15.7* 11.1* 10.6* 9.1   Microbiology Recent Results (from the past 240 hour(s))  Culture, blood (routine x 2)     Status: Abnormal   Collection Time: 10/30/22 10:00 AM   Specimen: BLOOD RIGHT FOREARM  Result Value Ref Range Status   Specimen Description BLOOD RIGHT FOREARM  Final   Special Requests   Final    BOTTLES DRAWN AEROBIC AND ANAEROBIC Blood Culture adequate volume   Culture  Setup Time   Final    GRAM POSITIVE COCCI AEROBIC BOTTLE ONLY CRITICAL RESULT CALLED TO, READ BACK BY AND VERIFIED WITH: T RUDISILL,PHARMD'@0152'$  10/31/22 [place] Performed at [facility], [place] [address], [place], [place] [address]    Culture STREPTOCOCCUS GROUP G (A)  Final   Report Status 11/02/2022 FINAL  Final   Organism ID, Bacteria STREPTOCOCCUS GROUP G  Final      Susceptibility   Streptococcus group g - MIC*    CLINDAMYCIN >=1 RESISTANT Resistant     AMPICILLIN  <=0.25 SENSITIVE Sensitive     ERYTHROMYCIN >=8 RESISTANT Resistant     VANCOMYCIN 0.5 SENSITIVE Sensitive     CEFTRIAXONE <=0.12 SENSITIVE Sensitive     LEVOFLOXACIN 0.5 SENSITIVE Sensitive  PENICILLIN <=0.06 SENSITIVE Sensitive     * STREPTOCOCCUS GROUP G  Blood Culture ID Panel (Reflexed)     Status: Abnormal   Collection Time: 10/30/22 10:00 AM  Result Value Ref Range Status   Enterococcus faecalis NOT DETECTED NOT DETECTED Final   Enterococcus Faecium NOT DETECTED NOT DETECTED Final   Listeria monocytogenes NOT DETECTED NOT DETECTED Final   Staphylococcus species NOT DETECTED NOT DETECTED Final   Staphylococcus aureus (BCID) NOT DETECTED NOT DETECTED Final   Staphylococcus epidermidis NOT DETECTED NOT DETECTED Final   Staphylococcus lugdunensis NOT DETECTED NOT DETECTED Final   Streptococcus species DETECTED (A) NOT DETECTED Final    Comment: Not Enterococcus species, Streptococcus agalactiae, Streptococcus pyogenes, or Streptococcus pneumoniae. CRITICAL RESULT CALLED TO, READ BACK BY AND VERIFIED WITH: T RUDISILL,PHARMD'@0152'$  10/31/22 [place]    Streptococcus agalactiae NOT DETECTED NOT DETECTED Final   Streptococcus pneumoniae NOT DETECTED NOT DETECTED Final   Streptococcus pyogenes NOT DETECTED NOT DETECTED Final   A.calcoaceticus-baumannii NOT DETECTED NOT DETECTED Final   Bacteroides fragilis NOT DETECTED NOT DETECTED Final   Enterobacterales NOT DETECTED NOT DETECTED Final   Enterobacter cloacae complex NOT DETECTED NOT DETECTED Final   Escherichia coli NOT DETECTED NOT DETECTED Final   Klebsiella aerogenes NOT DETECTED NOT DETECTED Final   Klebsiella oxytoca NOT DETECTED NOT DETECTED Final   Klebsiella pneumoniae NOT DETECTED NOT DETECTED Final   Proteus species NOT DETECTED NOT DETECTED Final   Salmonella species NOT DETECTED NOT DETECTED Final   Serratia marcescens NOT DETECTED NOT DETECTED Final   Haemophilus influenzae NOT DETECTED NOT DETECTED Final   Neisseria  meningitidis NOT DETECTED NOT DETECTED Final   Pseudomonas aeruginosa NOT DETECTED NOT DETECTED Final   Stenotrophomonas maltophilia NOT DETECTED NOT DETECTED Final   Candida albicans NOT DETECTED NOT DETECTED Final   Candida auris NOT DETECTED NOT DETECTED Final   Candida glabrata NOT DETECTED NOT DETECTED Final   Candida krusei NOT DETECTED NOT DETECTED Final   Candida parapsilosis NOT DETECTED NOT DETECTED Final   Candida tropicalis NOT DETECTED NOT DETECTED Final   Cryptococcus neoformans/gattii NOT DETECTED NOT DETECTED Final    Comment: Performed at [facility], [address], [place], [place] [address]  Culture, blood (routine x 2)     Status: None   Collection Time: 10/30/22 10:07 AM   Specimen: BLOOD LEFT FOREARM  Result Value Ref Range Status   Specimen Description BLOOD LEFT FOREARM  Final   Special Requests   Final    BOTTLES DRAWN AEROBIC AND ANAEROBIC Blood Culture adequate volume   Culture   Final    NO GROWTH 5 DAYS Performed at [facility], [place] [address] [place], [place] [address]    Report Status 11/04/2022 FINAL  Final  Culture, blood (Routine X 2) w Reflex to ID Panel     Status: None (Preliminary result)   Collection Time: 11/01/22  6:30 AM   Specimen: BLOOD LEFT ARM  Result Value Ref Range Status   Specimen Description BLOOD LEFT ARM  Final   Special Requests   Final    BOTTLES DRAWN AEROBIC AND ANAEROBIC Blood Culture results may not be optimal due to an excessive volume of blood received in culture bottles   Culture   Final    NO GROWTH 3 DAYS Performed at [facility], [place] [address], [place], [place] [address]    Report Status PENDING  Incomplete  Culture, blood (Routine X 2) w Reflex to ID  Panel     Status: None (Preliminary result)   Collection Time: 11/01/22  6:30 AM   Specimen: BLOOD RIGHT ARM  Result Value Ref Range Status   Specimen Description BLOOD RIGHT ARM  Final   Special Requests   Final    BOTTLES DRAWN AEROBIC AND  ANAEROBIC Blood Culture results may not be optimal due to an excessive volume of blood received in culture bottles   Culture   Final    NO GROWTH 3 DAYS Performed at [facility], [place] [address], [place], [place] [address]    Report Status PENDING  Incomplete     Time coordinating discharge: Over 30 minutes  SIGNED:   Donnamarie Poag [place] DO  [facility] 11/04/2022, 12:26 PM

## 2022-11-04 NOTE — Progress Notes (Signed)
[facility] for Infectious Disease  Date of Admission:  10/30/2022           Reason for visit: Follow up on Strep bacteremia, cellulitis, hardware infection  Current antibiotics: Ceftriaxone   ASSESSMENT:    47 y.o. male admitted with:  Group G Streptococcus bacteremia: Blood cultures positive from 10/30/2022 and repeat cultures 11/01/2022 negative. 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  There was small amount of fluid and soft tissue edema, but no deep fluid collections or soft tissue gas.  RECOMMENDATIONS:    Continue with ceftriaxone 2gm daily IV Place PICC line Plan for 4 weeks of IV therapy to treat soft tissue infection and possibility of underlying OM and hardware involvement.  Then plan for likely transition to oral antibiotics with Augmentin (or Cefadroxil, or Amoxicillin) for continued treatment/suppression until plan in place for hardware removal at discretion of orthopedic surgery Will sign off, please call as needed   Diagnosis: Group G strep bacteremia, cellulitis, sinus tract to tibia  Culture Result: Group G strep  No Active Allergies  OPAT Orders Discharge antibiotics to be given via PICC line Discharge antibiotics: Per pharmacy protocol  Ceftriaxone 2gm daily  Duration: 4 weeks  End Date: 11/29/22  [facility] Care Per Protocol:  Home health RN for IV administration and teaching; PICC line care and labs.    Labs weekly while on IV antibiotics: _xxx_ CBC with differential __ BMP _xxx_ CMP xxx__ CRP xxx__ ESR __ Vancomycin trough __ CK  _xxx_ Please pull PIC at completion of IV antibiotics __ Please leave PIC in place until doctor has seen patient or been notified  Fax weekly labs to [phone]  Clinic Follow Up Appt: 11/19/22 at 2:30pm  with Dr West Bali   Principal Problem:   Cellulitis and abscess of leg Active Problems:   Essential hypertension   Tobacco dependence   Bacteremia due to Streptococcus   Cellulitis of left lower extremity    MEDICATIONS:    Scheduled Meds:  docusate sodium  100 mg Oral BID   feeding supplement  237 mL Oral BID BM   heparin  5,000 Units Subcutaneous Q8H   multivitamin with minerals  1 tablet Oral Daily   nicotine  14 mg Transdermal Daily   nutrition supplement (JUVEN)  1 packet Oral BID BM   Continuous Infusions:  cefTRIAXone (ROCEPHIN)  IV Stopped (11/03/22 2039)   PRN Meds:.acetaminophen **OR** acetaminophen, bisacodyl, hydrALAZINE, HYDROmorphone (DILAUDID) injection, ondansetron **OR** ondansetron (ZOFRAN) IV, oxyCODONE, polyethylene glycol  SUBJECTIVE:   24 hour events:  No acute events  Patient concerned re: continued pain and swelling.  Unsure of how he will manage at home with current symptoms.  States it is painful to put weight on ankle and to move his toes.  He can't range his ankle due to prior fusion.    Review of Systems  All other systems reviewed and are negative.     OBJECTIVE:   Blood pressure 128/80, pulse 72, temperature 98.6 F (37 C), temperature source Oral, resp. rate 18, height 6' 1"$  (1.854 m), weight 127 kg, SpO2 96 %. Body mass index is 36.94 kg/m.  Physical Exam Constitutional:      Appearance: Normal appearance.  Musculoskeletal:     Comments: Right leg with mild edema and changes c/w venous stasis. Left  leg with mild edema as well and changes c/w venous stasis.  Ankle is wrapped in Ace bandage.  Able to wiggle toes.   Skin:    General: Skin is warm and dry.  Neurological:     General: No focal deficit present.     Mental Status: He is alert and oriented to person, place, and time.  Psychiatric:        Mood and Affect: Mood normal.        Behavior: Behavior normal.      Lab Results: Lab Results  Component Value Date   WBC  9.1 11/04/2022   HGB 14.0 11/04/2022   HCT 42.6 11/04/2022   MCV 90.6 11/04/2022   PLT 248 11/04/2022    Lab Results  Component Value Date   NA 134 (L) 11/04/2022   K 4.8 11/04/2022   CO2 26 11/04/2022   GLUCOSE 120 (H) 11/04/2022   BUN 16 11/04/2022   CREATININE 0.97 11/04/2022   CALCIUM 8.8 (L) 11/04/2022   GFRNONAA >60 11/04/2022    Lab Results  Component Value Date   ALT 25 10/30/2022   AST 22 10/30/2022   ALKPHOS 75 10/30/2022   BILITOT 0.7 10/30/2022       Component Value Date/Time   CRP 6.8 (H) 10/30/2022 1539       Component Value Date/Time   ESRSEDRATE 23 (H) 10/30/2022 1539     I have reviewed the micro and lab results in Epic.  Imaging: CT ANKLE LEFT W CONTRAST  Result Date: 11/03/2022 CLINICAL DATA:  Soft tissue infection suspected, ankle, xray done EXAM: CT OF THE LEFT ANKLE WITH CONTRAST TECHNIQUE: Multidetector CT imaging of the left ankle was performed following the standard protocol during bolus administration of intravenous contrast. RADIATION DOSE REDUCTION: This exam was performed according to the departmental dose-optimization program which includes automated exposure control, adjustment of the mA and/or kV according to patient size and/or use of iterative reconstruction technique. CONTRAST:  47m OMNIPAQUE IOHEXOL 350 MG/ML SOLN COMPARISON:  X-ray 10/30/2022, MRI 10/31/2022 FINDINGS: Bones/Joint/Cartilage Redemonstrated postsurgical changes from prior distal tibial and fibular ORIF and tibiotalar arthrodesis. There is solid fusion across the tibiotalar joint space. Intact hardware without evidence of loosening. No evidence of an acute fracture. No dislocation. No appreciable tibiotalar joint effusion. Ligaments Suboptimally assessed by CT. Muscles and Tendons Ankle tendons are grossly intact by CT. No appreciable tenosynovial fluid collection. Generalized muscle atrophy. No intramuscular fluid collections are seen. Soft tissues Small amount of fluid  within the subcutaneous soft tissues at the medial aspect of the lower leg at the level of the distal tibial metaphysis measuring approximately 13 x 9 x 13 mm (series 4, image 95; series 9, image 69). A small sinus tract extending to the underlying tibial cortex was better seen by previous MRI. No erosion or aggressive periosteal elevation at this location to suggest acute osteomyelitis. Generalized soft tissue edema and fluid. No additional organized fluid collections are evident. No soft tissue gas. IMPRESSION: 1. Small amount of fluid within the subcutaneous soft tissues at the medial aspect of the lower leg at the level of the distal tibial metaphysis measuring approximately 13 x 9 x 13 mm. A small sinus tract extending to the underlying tibial cortex was better seen by previous MRI. No erosion or aggressive periosteal elevation at this location to suggest acute osteomyelitis. 2. Generalized soft tissue edema and fluid. No additional organized fluid collections are evident. No soft tissue gas. 3. Redemonstrated postsurgical changes from prior distal  tibial and fibular ORIF and tibiotalar arthrodesis. Electronically Signed   By: Davina Poke D.O.   On: 11/03/2022 14:05     Imaging independently reviewed in Epic.    Raynelle [facility] [phone] pager 11/04/2022, 9:44 AM  I have personally spent 50 minutes involved in face-to-face and non-face-to-face activities for this patient on the day of the visit. Professional time spent includes the following activities: Preparing to see the patient (review of tests), Obtaining and/or reviewing separately obtained history (admission/discharge record), Performing a medically appropriate examination and/or evaluation , Ordering medications/tests/procedures, referring and communicating with other health care professionals, Documenting clinical information in the EMR, Independently interpreting  results (not separately reported), Communicating results to the patient/family/caregiver, Counseling and educating the patient/family/caregiver and Care coordination (not separately reported).

## 2022-11-04 NOTE — Progress Notes (Signed)
     [name] is a 47 y.o. male   Orthopaedic diagnosis: Left lower leg cellulitis in the setting of prior left ankle arthrodesis   Subjective: Patient seen and examined this morning.  Again, he notes mild improvement in his pain and erythema in the left lower extremity since admission.  Recall, MRI scan and CT scans without evidence of deep fluid collection or osteomyelitis. There is a small sinus tract about the medial leg screw but no deep fluid collection or evidence bony infection. He understands the current plan is for continued IV antibiotics to address the soft tissue infection with consideration of hardware removal once under control. 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   Objectyive: Vitals:   11/04/22 0601 11/04/22 0843  BP: (!) 141/88 128/80  Pulse: 76 72  Resp: 18 18  Temp: 97.9 F (36.6 C) 98.6 F (37 C)  SpO2: 97% 96%     Exam: Awake and alert Respirations even and unlabored No acute distress  Left lower extremity shows persistent erythema and swelling globally about the ankle and extends to the level of the midcalf.  There is perhaps some mild interval improvement since admission.  Small draining wound about the medial ankle at the level of the lag screw appears unchanged.  There is no obvious purulence.  He has significant tenderness and hypersensitivity to palpation throughout the ankle and lower leg.  Is able to wiggle the toes. The ankle appears well fused to attempted passive manipulation but does elicit discomfort. Calf is soft and nontender proximally.  Sensation grossly intact.  Warm and well-perfused distally.     Assessment: Left lower extremity cellulitis in the setting of prior left ankle arthrodesis in 2022, with small sinus tract from medial lag screw by MRI and CT scan without evidence of osteomyelitis or deep fluid collection   Plan: Patient has no evidence of deep fluid collection or osteomyelitis that may preclude  him from improving with IV antibiotics. There are presently no indications that would necessitate urgent operative intervention.  Certainly do think that the hardware may be contributing to this infection and we would plan for removal in the outpatient setting once his soft tissue infection and cellulitis has improved.  I would hope for more improvement with IV antibiotics but his progress seems to be slow.  We discussed potential vascular etiology or venous stasis that may be contributing as well once again. He may continue with daily dressing changes and monitoring.  He may be weightbearing as tolerated with walker.   We will plan to follow from a distance from this point.  Plan to revisit him outpatient once infection and soft tissues has improved for consideration of hardware removal.  Certainly happy to reevaluate while inpatient if there are any changes in the interim.   Nathaly Dawkins J. [place], PA-C

## 2022-11-04 NOTE — Progress Notes (Signed)
PT Cancellation Note  Patient Details Name: [name] MRN: [id number] DOB: Aug 10, 1976   Cancelled Treatment:    Reason Eval/Treat Not Completed: Other (comment);Pain limiting ability to participate.  Declined PT due to pain on LLE, asking about how to manage so he can stand on it.  Talked with him about the NWB aspect of using crutches to get up steps, declined to practice this now.  Referred pt concerns to medical staff.   Ramond Dial 11/04/2022, 11:50 AM  Mee Hives, PT PhD Acute Rehab Dept. Number: [place] [facility] [place]

## 2022-11-04 NOTE — Plan of Care (Signed)

## 2022-11-04 NOTE — Progress Notes (Signed)
PROGRESS NOTE    Calvin ICENHOWER  [id number] DOB: 1975/09/20 DOA: 10/30/2022 PCP: Patient, No Pcp Per    Brief Narrative:   [name] is a 47 y.o. 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 Lucia Gaskins 2022 who presented to [facility] ED with persistent erythema, swelling, and pain to his left lower extremity.  Patient has been treated for cellulitis outpatient with a course of doxycycline and Keflex without improvement.  Also reports now ulceration to ankle.  Denies history of diabetes.  In the ED, temperature 99.0 F, HR 105, RR 20, BP 140/70, SpO2 96% on room air.  WBC 13.2, hemoglobin 16.4, platelets 210.  Sodium 137, potassium 3.9, chloride 104, CO2 24, glucose 130, BUN 15, creatinine 1.02.  AST 22, ALT 25, total bilirubin 0.7.  Lactic acid 1.6.  X-ray left foot/tibia/fibula with soft tissue swelling left lower leg, foot and ankle, intact ankle fixation hardware without evidence of complication, no evidence of acute osseous abnormality.  Blood cultures x 2 obtained.  Orthopedics consulted.  Patient was started on IV vancomycin/ceftriaxone/Flagyl.  TRH consulted for further evaluation management of persistent right lower extremity cellulitis with concern for possible deep infection with failed outpatient antibiotic treatment.  Assessment & Plan:   Sepsis, POA Streptococcus septicemia Right lower extremity cellulitis with possible sinus tract Patient presenting to ED with persistent erythema, edema, pain to right lower extremity despite course of outpatient Keflex and doxycycline.  Complicated by his history of left ankle fracture s/p arthrocentesis 2022 by Dr. Lucia Gaskins.  Patient with tachycardia, elevated WBC count of 13.2 on admission.  Lactic acid 1.2.  Hemoglobin A1c 5.8.  MR left ankle with and without contrast with  subcutaneous edema, small skin wound medial distal tibial metaphysis, suspected sinus tract extending to bone with mild surrounding peripheral enhancement consistent with soft tissue infection, no drainable fluid collection identified, no evidence of osteomyelitis.  Seen by orthopedics, no surgical indication at this time; with consideration of hardware removal outpatient once infection clears.  ABIs within normal limits.  CT left ankle with contrast with small amount of fluid within the subcu soft tissues at the medial aspect of the lower leg, small sinus track without erosion or aggressive periosteal elevation to suggest acute osteomyelitis, generalized soft tissue edema with no organized fluid collections or soft tissue gas. -- Orthopedics, infectious disease following, appreciate assistance -- WBC 13.2>16.5>15.7>11.1>10.6>9.1 -- CRP 6.8 -- Blood cultures 1/4:  Streptococcus group D, resistant to clindamycin/erythromycin -- Blood Cultures 2/17: No growth x 3 days -- Ceftriaxone 2 g IV every 24 hours -- Oxycodone 5-10 mg PO q4h PRN moderate pain -- Dilaudid 0.5 mg IV every 2 hours as needed severe pain -- ID plans 4-week course of IV ceftriaxone on discharge from culture negative date of 2/17 with likely transition to oral antibiotics thereafter. -- PICC line ordered  Hx essential hypertension Currently not on antihypertensive therapy. -- Hydralazine 3m IV q4h PRN SBP >180 -- Continue monitor BP closely  Tobacco use disorder Counseled on need for complete cessation/abstinence. -- Nicotine patch  Obesity Body mass index is 36.94 kg/m.  Discussed with patient needs for aggressive lifestyle changes/weight loss as this complicates all facets of care.  Outpatient follow-up with PCP.  May benefit from bariatric evaluation outpatient.   DVT prophylaxis: heparin injection 5,000 Units Start: 10/30/22 1400    Code Status: Full Code  Family Communication: No family present bedside this  morning  Disposition Plan:  Level of care: Med-Surg Status is: Inpatient Remains inpatient appropriate because: IV antibiotics, CT left ankle today, further per orthopedic/ID.  Consultants:  Infectious disease Orthopedic surgery  Procedures:  None  Antimicrobials:  Vancomycin 2/15 - 2/15 Cefepime 2/15 - 2/15 Linezolid 2/16 - 2/18 Ceftriaxone 2/15>> Metronidazole 2/15 - 2/18   Subjective: Patient seen examined bedside, resting comfortably.  Sitting at edge of bed.  Continues to be irritated regarding lack of progress.  By orthopedics PA again this morning with no need for acute surgical intervention.  Seen by ID with plans for PICC line placement and 4-week IV antibiotic course outpatient.  Continues to report difficult ambulation and unable to put much pressure on his left foot.  Leukocytosis now resolved.  No other specific complaints or concerns at this time.  Denies headache, no dizziness, no chest pain, no palpitations, no fever, no chills/night sweats, no nausea/vomiting/diarrhea, no focal weakness, no fatigue, no paresthesias.  No acute events overnight per nursing staff.  Objective: Vitals:   11/03/22 1624 11/03/22 2020 11/04/22 0601 11/04/22 0843  BP: 130/67 (!) 148/83 (!) 141/88 128/80  Pulse: 84 88 76 72  Resp: 16  18 18  $ Temp: 98.9 F (37.2 C) 98.2 F (36.8 C) 97.9 F (36.6 C) 98.6 F (37 C)  TempSrc: Oral Oral Oral Oral  SpO2: 97% 98% 97% 96%  Weight:      Height:        Intake/Output Summary (Last 24 hours) at 11/04/2022 1045 Last data filed at 11/04/2022 0840 Gross per 24 hour  Intake 940 ml  Output --  Net 940 ml   Filed Weights   10/30/22 0903  Weight: 127 kg    Examination:  Physical Exam: GEN: NAD, alert and oriented x 3, obese HEENT: NCAT, PERRL, EOMI, sclera clear, MMM PULM: CTAB w/o wheezes/crackles, normal respiratory effort, on room air CV: RRR w/o M/G/R GI: abd soft, NTND, NABS, no R/G/M MSK: Noted edema, erythema and TTP to  palpation in the left ankle/lower left leg, Ace wrap in place, moves all extremities independently NEURO: CN II-XII intact, no focal deficits, sensation to light touch intact PSYCH: normal mood/affect Integumentary: Edema, erythema noted to left lower extremity as depicted below with Ace wrap in place, otherwise no other concerning rashes/lesions/wounds noted on exposed skin surfaces.           Data Reviewed: I have personally reviewed following labs and imaging studies  CBC: Recent Labs  Lab 10/30/22 0911 10/31/22 0411 11/01/22 0618 11/02/22 0619 11/03/22 0424 11/04/22 0050  WBC 13.2* 16.5* 15.7* 11.1* 10.6* 9.1  NEUTROABS 10.4*  --   --   --   --   --   HGB 16.4 14.2 13.8 13.4 14.7 14.0  HCT 46.6 41.1 39.3 38.9* 43.4 42.6  MCV 88.4 89.2 88.1 88.0 89.5 90.6  PLT 210 174 175 198 256 [id number]   Basic Metabolic Panel: Recent Labs  Lab 10/31/22 0411 11/01/22 0618 11/02/22 0619 11/03/22 0424 11/04/22 0050  NA 131* 129* 131* 134* 134*  K 3.9 3.8 4.3 4.2 4.8  CL 98 98 99 95* 99  CO2 24 23 24 26 26  $ GLUCOSE 115* 103* 116* 122* 120*  BUN 9 9 11 14 16  $ CREATININE 0.94 0.86 0.94 1.00 0.97  CALCIUM 8.2* 8.2* 8.4* 9.0 8.8*   GFR: Estimated Creatinine Clearance: 130 mL/min (by C-G formula based on SCr of 0.97 mg/dL). Liver Function Tests: Recent  Labs  Lab 10/30/22 0911  AST 22  ALT 25  ALKPHOS 75  BILITOT 0.7  PROT 7.6  ALBUMIN 3.7   No results for input(s): "LIPASE", "AMYLASE" in the last 168 hours. No results for input(s): "AMMONIA" in the last 168 hours. Coagulation Profile: No results for input(s): "INR", "PROTIME" in the last 168 hours. Cardiac Enzymes: No results for input(s): "CKTOTAL", "CKMB", "CKMBINDEX", "TROPONINI" in the last 168 hours. BNP (last 3 results) No results for input(s): "PROBNP" in the last 8760 hours. HbA1C: No results for input(s): "HGBA1C" in the last 72 hours.  CBG: No results for input(s): "GLUCAP" in the last 168 hours. Lipid  Profile: No results for input(s): "CHOL", "HDL", "LDLCALC", "TRIG", "CHOLHDL", "LDLDIRECT" in the last 72 hours. Thyroid Function Tests: No results for input(s): "TSH", "T4TOTAL", "FREET4", "T3FREE", "THYROIDAB" in the last 72 hours. Anemia Panel: No results for input(s): "VITAMINB12", "FOLATE", "FERRITIN", "TIBC", "IRON", "RETICCTPCT" in the last 72 hours. Sepsis Labs: Recent Labs  Lab 10/30/22 0911 10/30/22 1145  LATICACIDVEN 1.6 1.2    Recent Results (from the past 240 hour(s))  Culture, blood (routine x 2)     Status: Abnormal   Collection Time: 10/30/22 10:00 AM   Specimen: BLOOD RIGHT FOREARM  Result Value Ref Range Status   Specimen Description BLOOD RIGHT FOREARM  Final   Special Requests   Final    BOTTLES DRAWN AEROBIC AND ANAEROBIC Blood Culture adequate volume   Culture  Setup Time   Final    GRAM POSITIVE COCCI AEROBIC BOTTLE ONLY CRITICAL RESULT CALLED TO, READ BACK BY AND VERIFIED WITH: T RUDISILL,PHARMD@0152$  10/31/22 [place] Performed at [facility], [place] [address] [place], [place] [address]    Culture STREPTOCOCCUS GROUP G (A)  Final   Report Status 11/02/2022 FINAL  Final   Organism ID, Bacteria STREPTOCOCCUS GROUP G  Final      Susceptibility   Streptococcus group g - MIC*    CLINDAMYCIN >=1 RESISTANT Resistant     AMPICILLIN <=0.25 SENSITIVE Sensitive     ERYTHROMYCIN >=8 RESISTANT Resistant     VANCOMYCIN 0.5 SENSITIVE Sensitive     CEFTRIAXONE <=0.12 SENSITIVE Sensitive     LEVOFLOXACIN 0.5 SENSITIVE Sensitive     PENICILLIN <=0.06 SENSITIVE Sensitive     * STREPTOCOCCUS GROUP G  Blood Culture ID Panel (Reflexed)     Status: Abnormal   Collection Time: 10/30/22 10:00 AM  Result Value Ref Range Status   Enterococcus faecalis NOT DETECTED NOT DETECTED Final   Enterococcus Faecium NOT DETECTED NOT DETECTED Final   Listeria monocytogenes NOT DETECTED NOT DETECTED Final   Staphylococcus species NOT DETECTED NOT DETECTED Final   Staphylococcus aureus  (BCID) NOT DETECTED NOT DETECTED Final   Staphylococcus epidermidis NOT DETECTED NOT DETECTED Final   Staphylococcus lugdunensis NOT DETECTED NOT DETECTED Final   Streptococcus species DETECTED (A) NOT DETECTED Final    Comment: Not Enterococcus species, Streptococcus agalactiae, Streptococcus pyogenes, or Streptococcus pneumoniae. CRITICAL RESULT CALLED TO, READ BACK BY AND VERIFIED WITH: T RUDISILL,PHARMD@0152$  10/31/22 [place]    Streptococcus agalactiae NOT DETECTED NOT DETECTED Final   Streptococcus pneumoniae NOT DETECTED NOT DETECTED Final   Streptococcus pyogenes NOT DETECTED NOT DETECTED Final   A.calcoaceticus-baumannii NOT DETECTED NOT DETECTED Final   Bacteroides fragilis NOT DETECTED NOT DETECTED Final   Enterobacterales NOT DETECTED NOT DETECTED Final   Enterobacter cloacae complex NOT DETECTED NOT DETECTED Final   Escherichia coli NOT DETECTED NOT DETECTED Final   Klebsiella aerogenes NOT DETECTED  NOT DETECTED Final   Klebsiella oxytoca NOT DETECTED NOT DETECTED Final   Klebsiella pneumoniae NOT DETECTED NOT DETECTED Final   Proteus species NOT DETECTED NOT DETECTED Final   Salmonella species NOT DETECTED NOT DETECTED Final   Serratia marcescens NOT DETECTED NOT DETECTED Final   Haemophilus influenzae NOT DETECTED NOT DETECTED Final   Neisseria meningitidis NOT DETECTED NOT DETECTED Final   Pseudomonas aeruginosa NOT DETECTED NOT DETECTED Final   Stenotrophomonas maltophilia NOT DETECTED NOT DETECTED Final   Candida albicans NOT DETECTED NOT DETECTED Final   Candida auris NOT DETECTED NOT DETECTED Final   Candida glabrata NOT DETECTED NOT DETECTED Final   Candida krusei NOT DETECTED NOT DETECTED Final   Candida parapsilosis NOT DETECTED NOT DETECTED Final   Candida tropicalis NOT DETECTED NOT DETECTED Final   Cryptococcus neoformans/gattii NOT DETECTED NOT DETECTED Final    Comment: Performed at [facility], [place] [address] [place], [place] [address]  Culture, blood  (routine x 2)     Status: None   Collection Time: 10/30/22 10:07 AM   Specimen: BLOOD LEFT FOREARM  Result Value Ref Range Status   Specimen Description BLOOD LEFT FOREARM  Final   Special Requests   Final    BOTTLES DRAWN AEROBIC AND ANAEROBIC Blood Culture adequate volume   Culture   Final    NO GROWTH 5 DAYS Performed at [facility], [place] [address] [place], [place] [address]    Report Status 11/04/2022 FINAL  Final  Culture, blood (Routine X 2) w Reflex to ID Panel     Status: None (Preliminary result)   Collection Time: 11/01/22  6:30 AM   Specimen: BLOOD LEFT ARM  Result Value Ref Range Status   Specimen Description BLOOD LEFT ARM  Final   Special Requests   Final    BOTTLES DRAWN AEROBIC AND ANAEROBIC Blood Culture results may not be optimal due to an excessive volume of blood received in culture bottles   Culture   Final    NO GROWTH 3 DAYS Performed at [facility], [place] [address] [place], [place] [address]    Report Status PENDING  Incomplete  Culture, blood (Routine X 2) w Reflex to ID Panel     Status: None (Preliminary result)   Collection Time: 11/01/22  6:30 AM   Specimen: BLOOD RIGHT ARM  Result Value Ref Range Status   Specimen Description BLOOD RIGHT ARM  Final   Special Requests   Final    BOTTLES DRAWN AEROBIC AND ANAEROBIC Blood Culture results may not be optimal due to an excessive volume of blood received in culture bottles   Culture   Final    NO GROWTH 3 DAYS Performed at [facility], [place] [address] [place], [place] [address]    Report Status PENDING  Incomplete         Radiology Studies: [place] EKG SITE RITE  Result Date: 11/04/2022 If [facility] image not attached, placement could not be confirmed due to current cardiac rhythm.  CT ANKLE LEFT W CONTRAST  Result Date: 11/03/2022 CLINICAL DATA:  Soft tissue infection suspected, ankle, xray done EXAM: CT OF THE LEFT ANKLE WITH CONTRAST TECHNIQUE: Multidetector CT imaging of the  left ankle was performed following the standard protocol during bolus administration of intravenous contrast. RADIATION DOSE REDUCTION: This exam was performed according to the departmental dose-optimization program which includes automated exposure control, adjustment of the mA and/or kV according to patient size  and/or use of iterative reconstruction technique. CONTRAST:  40m OMNIPAQUE IOHEXOL 350 MG/ML SOLN COMPARISON:  X-ray 10/30/2022, MRI 10/31/2022 FINDINGS: Bones/Joint/Cartilage Redemonstrated postsurgical changes from prior distal tibial and fibular ORIF and tibiotalar arthrodesis. There is solid fusion across the tibiotalar joint space. Intact hardware without evidence of loosening. No evidence of an acute fracture. No dislocation. No appreciable tibiotalar joint effusion. Ligaments Suboptimally assessed by CT. Muscles and Tendons Ankle tendons are grossly intact by CT. No appreciable tenosynovial fluid collection. Generalized muscle atrophy. No intramuscular fluid collections are seen. Soft tissues Small amount of fluid within the subcutaneous soft tissues at the medial aspect of the lower leg at the level of the distal tibial metaphysis measuring approximately 13 x 9 x 13 mm (series 4, image 95; series 9, image 69). A small sinus tract extending to the underlying tibial cortex was better seen by previous MRI. No erosion or aggressive periosteal elevation at this location to suggest acute osteomyelitis. Generalized soft tissue edema and fluid. No additional organized fluid collections are evident. No soft tissue gas. IMPRESSION: 1. Small amount of fluid within the subcutaneous soft tissues at the medial aspect of the lower leg at the level of the distal tibial metaphysis measuring approximately 13 x 9 x 13 mm. A small sinus tract extending to the underlying tibial cortex was better seen by previous MRI. No erosion or aggressive periosteal elevation at this location to suggest acute osteomyelitis. 2.  Generalized soft tissue edema and fluid. No additional organized fluid collections are evident. No soft tissue gas. 3. Redemonstrated postsurgical changes from prior distal tibial and fibular ORIF and tibiotalar arthrodesis. Electronically Signed   By: NDavina PokeD.O.   On: 11/03/2022 14:05        Scheduled Meds:  docusate sodium  100 mg Oral BID   feeding supplement  237 mL Oral BID BM   heparin  5,000 Units Subcutaneous Q8H   multivitamin with minerals  1 tablet Oral Daily   nicotine  14 mg Transdermal Daily   nutrition supplement (JUVEN)  1 packet Oral BID BM   Continuous Infusions:  cefTRIAXone (ROCEPHIN)  IV Stopped (11/03/22 2039)     LOS: 5 days    Time spent: 49 minutes spent on chart review, discussion with nursing staff, consultants, updating family and interview/physical exam; more than 50% of that time was spent in counseling and/or coordination of care.    Azul Coffie J ABrit[place] DO [facility] Available via Epic secure chat 7am-7pm After these hours, please refer to coverage provider listed on [email or web address] 11/04/2022, 10:45 AM

## 2022-11-04 NOTE — Progress Notes (Signed)
Peripherally Inserted Central Catheter Placement  The IV Nurse has discussed with the patient and/or persons authorized to consent for the patient, the purpose of this procedure and the potential benefits and risks involved with this procedure.  The benefits include less needle sticks, lab draws from the catheter, and the patient may be discharged home with the catheter. Risks include, but not limited to, infection, bleeding, blood clot (thrombus formation), and puncture of an artery; nerve damage and irregular heartbeat and possibility to perform a PICC exchange if needed/ordered by physician.  Alternatives to this procedure were also discussed.  Bard Power PICC patient education guide, fact sheet on infection prevention and patient information card has been provided to patient /or left at bedside.    PICC Placement Documentation  PICC Single Lumen 0000000 Right Basilic 45 cm 0 cm (Active)  Indication for Insertion or Continuance of Line Home intravenous therapies (PICC only) 11/04/22 1424  Exposed Catheter (cm) 0 cm 11/04/22 1424  Site Assessment Clean, Dry, Intact 11/04/22 1424  Line Status Flushed;Saline locked;Blood return noted 11/04/22 1424  Dressing Type Transparent;Securing device 11/04/22 1424  Dressing Status Antimicrobial disc in place 11/04/22 1424  Dressing Intervention New dressing;Other (Comment) 11/04/22 1424  Dressing Change Due 11/11/22 11/04/22 1424       [name] 11/04/2022, 2:25 PM

## 2022-11-04 NOTE — Progress Notes (Signed)
   11/04/22 1100  Mobility  Activity Ambulated with assistance to bathroom  Level of Assistance Standby assist, set-up cues, supervision of patient - no hands on  Assistive Device Front wheel walker  Distance Ambulated (ft) 20 ft  Activity Response Tolerated fair  Mobility Referral Yes  $Mobility charge 1 Mobility   Mobility Specialist Progress Note  Pt in bed and agreeable. Had c/o of L ankle pain. Returned to bed w/ all needs met and call bell in reach.   Lucious Groves Mobility Specialist  Please contact via SecureChat or Rehab office at [phone]

## 2022-11-06 LAB — CULTURE, BLOOD (ROUTINE X 2)
Culture: NO GROWTH
Culture: NO GROWTH

## 2022-11-11 ENCOUNTER — Ambulatory Visit (INDEPENDENT_AMBULATORY_CARE_PROVIDER_SITE_OTHER): Payer: BLUE CROSS/BLUE SHIELD | Admitting: Student

## 2022-11-11 ENCOUNTER — Encounter: Payer: Self-pay | Admitting: Student

## 2022-11-11 ENCOUNTER — Other Ambulatory Visit: Payer: Self-pay

## 2022-11-11 VITALS — BP 121/84 | HR 103 | Temp 97.6°F | Ht 73.0 in | Wt 280.0 lb

## 2022-11-11 DIAGNOSIS — S82852S Displaced trimalleolar fracture of left lower leg, sequela: Secondary | ICD-10-CM

## 2022-11-11 DIAGNOSIS — F17210 Nicotine dependence, cigarettes, uncomplicated: Secondary | ICD-10-CM | POA: Diagnosis not present

## 2022-11-11 DIAGNOSIS — Z1211 Encounter for screening for malignant neoplasm of colon: Secondary | ICD-10-CM

## 2022-11-11 DIAGNOSIS — R7881 Bacteremia: Secondary | ICD-10-CM | POA: Diagnosis not present

## 2022-11-11 DIAGNOSIS — S82852D Displaced trimalleolar fracture of left lower leg, subsequent encounter for closed fracture with routine healing: Secondary | ICD-10-CM

## 2022-11-11 DIAGNOSIS — Z86010 Personal history of colonic polyps: Secondary | ICD-10-CM

## 2022-11-11 DIAGNOSIS — I10 Essential (primary) hypertension: Secondary | ICD-10-CM

## 2022-11-11 DIAGNOSIS — B955 Unspecified streptococcus as the cause of diseases classified elsewhere: Secondary | ICD-10-CM

## 2022-11-11 DIAGNOSIS — F17200 Nicotine dependence, unspecified, uncomplicated: Secondary | ICD-10-CM

## 2022-11-11 MED ORDER — VARENICLINE TARTRATE 0.5 MG PO TABS: ORAL_TABLET | ORAL | 0 refills | Status: AC

## 2022-11-11 MED ORDER — NICOTINE POLACRILEX 2 MG MT GUM: 2.0000 mg | CHEWING_GUM | OROMUCOSAL | 0 refills | Status: AC | PRN

## 2022-11-11 NOTE — Patient Instructions (Addendum)
Thank you, [name] for allowing [place] to provide your care today. Today we discussed .  Foot infection Please continue to use your antibiotics and if you have any complications or worsening pain give [place] a call.   Smoking Please use the patches daily and gum for breakthrough cravings. Please take the chantix 1 pill daily for 3 days, 2 pills daily for 3 days, and then increase to 2 pills in the morning and 2 pills in the evening. IF you have any side effects, please call us and stop taking the medicine.   Colon cancer screening I have put in a referral for a colonoscopy. Please have them schedule this at a minimum 3 months after you have your surgery.    I have ordered the following labs for you:  Lab Orders  No laboratory test(s) ordered today     Referrals ordered today:   Referral Orders  No referral(s) requested today     I have ordered the following medication/changed the following medications:   Stop the following medications: There are no discontinued medications.   Start the following medications: Meds ordered this encounter  Medications   varenicline (CHANTIX) 0.5 MG tablet    Sig: Take 1 tablet (0.5 mg total) by mouth daily for 3 days, THEN 1 tablet (0.5 mg total) 2 (two) times daily for 7 days, THEN 2 tablets (1 mg total) 2 (two) times daily.    Dispense:  137 tablet    Refill:  0   nicotine polacrilex (NICORETTE) 2 MG gum    Sig: Take 1 each (2 mg total) by mouth as needed for smoking cessation.    Dispense:  100 tablet    Refill:  0     Follow up:  1 month to discuss how you are doing with quitting smoking     Should you have any questions or concerns please call the internal medicine clinic at [phone].    Sanjuana Letters, D.O. [place]

## 2022-11-12 DIAGNOSIS — Z860100 Personal history of colon polyps, unspecified: Secondary | ICD-10-CM | POA: Insufficient documentation

## 2022-11-12 NOTE — Assessment & Plan Note (Addendum)
Assessment: Secondary to cellulitis of the left lower extremity with potential complications of his left lower extremity hardware. Risk factors for his celluitis and potential complications are his obesity and tobacco use. Positive BC on 2/15, repeat cultures negative on 2/17.   Plan: - ceftriaxone 2g daily until 3/16 - follow up with ID 03/06 and orthopedics weekly

## 2022-11-12 NOTE — Assessment & Plan Note (Addendum)
Assessment: 09/2020 he presented to the ED with a left trimalleolar ankle fracture. He underwent open reduction internal fixation and was discharged home. 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 Arthrodesis was performed and he was discharged home. Last month he presented to urgent care for fever, chills, and cellulitis of his left lower extremity. He failed outpatient abx and was eventually admitted for left lower extremity cellulitis with a possible sinus track and bacteremia. Blood cultures were positive for Group G streptococcus, repeat cultures two days later were negative. They were unable to obtain an MRI but suspicious for osteomyelitis and infected hardware.  He was discharged home with weekly follow up with orthopedics, infectious disease follow up, and home IV abx with ceftriaxone for 4 weeks. A nurse comes to his house weekly to change his PICC line bandage. He has been doing well with self administering his antibiotic. He denies significant pain and takes advil as needed for the pain. He was last seen by orthopedics yesterday and they note continued improvement of his left lower extremity cellulitis. Denies requiring PT and is using a scooter to help with ambulation. No difficulties in performing ADL/iADL. Encouraged him to elevate his leg as able to help with the swelling and not wrap the leg too tight. He will reach out to his insurance/employer to discuss short term disability.   Plan: - continue ceftriaxone 2g daily, end date of 3/16. He has follow up with ID next week - continue to follow with orthopedics daily, plan for surgical intervention once cellulitis resolves - return precautions if symptoms worsen

## 2022-11-12 NOTE — Assessment & Plan Note (Signed)
Assessment: Notes in his 20's he had bloody stools, had colonoscopy with colonic polyps done at [facility]. Bleeding was thought to be secondary to hemorrhoids. Has not had follow up since.  He is due for colon cancer screening. Will go ahead and place referral for colonoscopy. Recommended he schedule this at least three months after surgical intervention of his left foot  Plan: -

## 2022-11-12 NOTE — Assessment & Plan Note (Signed)
Assessment: Hx of hypertension but well controlled without medical therapies. Denies being on anti-hypertensive's in the past  Plan: - continue to monitor

## 2022-11-12 NOTE — Assessment & Plan Note (Signed)
Assessment: 27 pack year history, he quit for a year prior to his left ankle fracture and started back after being at home and unable to work. He was able to quit by slowly decreasing number of cigarettes per day. He is interesting in stopping and in medical therapy to help with this. We discussed how this will delay healing of his cellulitis and put him at increase for post operative infections once the hardware is removed. He is already using nicotine patches, will send in for nicotine gum as well as varenicline.   Plan: - continue 21 mg nicotine patches daily and gum PRN - chantix  .5 mg daily and up-titrate to a goal of '1mg'$  BID.

## 2022-11-12 NOTE — Addendum Note (Signed)
Addended by: Riesa Pope on: 11/12/2022 12:55 PM   Modules accepted: Orders

## 2022-11-12 NOTE — Progress Notes (Addendum)
CC: Hospital follow up - streptococcal bactermia 2/2 cellulitiss of the left lower extremity  HPI:  [name] is a 47 y.o. male living with a history stated below and presents today for hospital follow up after admission for left lower extremity cellulitis with likely infected hardware. Please see problem based assessment and plan for additional details.  Past Medical History:  Diagnosis Date   Cellulitis    History of kidney stones    Hypertension    per pt, resolved after weight loss   Personal history of colonic polyps    Prediabetes     Current Outpatient Medications on File Prior to Visit  Medication Sig Dispense Refill   benzonatate (TESSALON) 100 MG capsule Take 1 capsule (100 mg total) by mouth every 8 (eight) hours. 21 capsule 0   cefTRIAXone (ROCEPHIN) IVPB Inject 2 g into the vein daily for 25 days. Indication:  L-ankle infection First Dose: Yes Last Day of Therapy:  11/29/22 Labs - Once weekly:  CBC/D and BMP, Labs - Every other week:  ESR and CRP Method of administration: IV Push Pull PICC line at the completion of IV therayp Method of administration may be changed at the discretion of home infusion pharmacist based upon assessment of the patient and/or caregiver's ability to self-administer the medication ordered. 25 Units 0   ibuprofen (ADVIL) 200 MG tablet Take 800 mg by mouth every 8 (eight) hours as needed for headache, mild pain or moderate pain.     oxymetazoline (AFRIN) 0.05 % nasal spray Place 1-2 sprays into both nostrils at bedtime as needed for congestion.     urea (CARMOL) 40 % CREA Apply 1 application  topically daily.     No current facility-administered medications on file prior to visit.    Family History  Problem Relation Age of Onset   Skin cancer Mother    Heart murmur Father    Osteoarthritis Sister     Social History   Socioeconomic History   Marital status: Single    Spouse name: Not on file   Number of children: Not on  file   Years of education: Not on file   Highest education level: Not on file  Occupational History   Occupation: auto body estimator  Tobacco Use   Smoking status: Every Day    Packs/day: 1.00    Years: 27.00    Total pack years: 27.00    Types: Cigarettes   Smokeless tobacco: Never  Vaping Use   Vaping Use: Some days   Substances: Nicotine  Substance and Sexual Activity   Alcohol use: Yes    Comment: rare   Drug use: Never   Sexual activity: Yes    Partners: Female    Birth control/protection: Surgical, Condom  Other Topics Concern   Not on file  Social History Narrative   Not on file   Social Determinants of Health   Financial Resource Strain: Not on file  Food Insecurity: No Food Insecurity (11/11/2022)   Hunger Vital Sign    Worried About Running Out of Food in the Last Year: Never true    Ran Out of Food in the Last Year: Never true  Transportation Needs: Not on file  Physical Activity: Not on file  Stress: Not on file  Social Connections: Socially Isolated (11/11/2022)   Social Connection and Isolation Panel [NHANES]    Frequency of Communication with Friends and Family: More than three times a week    Frequency of Social Gatherings  with Friends and Family: More than three times a week    Attends Religious Services: Never    Active Member of Clubs or Organizations: No    Attends Archivist Meetings: Never    Marital Status: Divorced  Human resources officer Violence: Not At Risk (11/11/2022)   Humiliation, Afraid, Rape, and Kick questionnaire    Fear of Current or Ex-Partner: No    Emotionally Abused: No    Physically Abused: No    Sexually Abused: No    Review of Systems: ROS negative except for what is noted on the assessment and plan.  Vitals:   11/11/22 1554  BP: 121/84  Pulse: (!) 103  Temp: 97.6 F (36.4 C)  TempSrc: Oral  SpO2: 98%  Weight: 280 lb (127 kg)  Height: '6\' 1"'$  (1.854 m)    Physical Exam: Constitutional: well-appearing, in  no acute distress HENT: normocephalic atraumatic Eyes: conjunctiva non-erythematous Neck: supple Cardiovascular: regular rate and rhythm, no m/r/g Pulmonary/Chest: normal work of breathing on room air, lungs clear to auscultation bilaterally MSK: normal bulk and tone, right PICC line in plcae without erythema, tenderness, or edema around the site. Left lower extremity with erythema and 1+ edema.  Neurological: alert & oriented x 3. Skin: warm and dry Psych: pleasant   Assessment & Plan:   Essential hypertension Assessment: Hx of hypertension but well controlled without medical therapies. Denies being on anti-hypertensive's in the past  Plan: - continue to monitor  Closed displaced trimalleolar fracture of left ankle Assessment: 09/2020 he presented to the ED with a left trimalleolar ankle fracture. He underwent open reduction internal fixation and was discharged home. 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 Arthrodesis was performed and he was discharged home. Last month he presented to urgent care for fever, chills, and cellulitis of his left lower extremity. He failed outpatient abx and was eventually admitted for left lower extremity cellulitis with a possible sinus track and bacteremia. Blood cultures were positive for Group G streptococcus, repeat cultures two days later were negative. They were unable to obtain an MRI but suspicious for osteomyelitis and infected hardware.  He was discharged home with weekly follow up with orthopedics, infectious disease follow up, and home IV abx with ceftriaxone for 4 weeks. A nurse comes to his house weekly to change his PICC line bandage. He has been doing well with self administering his antibiotic. He denies significant pain and takes advil as needed for the pain. He was last seen by orthopedics yesterday and they note continued improvement of his left lower extremity  cellulitis. Denies requiring PT and is using a scooter to help with ambulation. No difficulties in performing ADL/iADL. Encouraged him to elevate his leg as able to help with the swelling and not wrap the leg too tight. He will reach out to his insurance/employer to discuss short term disability.   Plan: - continue ceftriaxone 2g daily, end date of 3/16. He has follow up with ID next week - continue to follow with orthopedics daily, plan for surgical intervention once cellulitis resolves - return precautions if symptoms worsen      Bacteremia due to Streptococcus Assessment: Secondary to cellulitis of the left lower extremity with potential complications of his left lower extremity hardware. Risk factors for his celluitis and potential complications are his obesity and tobacco use. Positive BC on 2/15, repeat cultures negative on 2/17.   Plan: - ceftriaxone 2g daily  until 3/16 - follow up with ID 03/06 and orthopedics weekly   Tobacco dependence Assessment: 27 pack year history, he quit for a year prior to his left ankle fracture and started back after being at home and unable to work. He was able to quit by slowly decreasing number of cigarettes per day. He is interesting in stopping and in medical therapy to help with this. We discussed how this will delay healing of his cellulitis and put him at increase for post operative infections once the hardware is removed. He is already using nicotine patches, will send in for nicotine gum as well as varenicline.   Plan: - continue 21 mg nicotine patches daily and gum PRN - chantix  .5 mg daily and up-titrate to a goal of '1mg'$  BID.   History of colonic polyps Assessment: Notes in his 20's he had bloody stools, had colonoscopy with colonic polyps done at [facility]. Bleeding was thought to be secondary to hemorrhoids. Has not had follow up since.  He is due for colon cancer screening. Will go ahead and place referral for colonoscopy.  Recommended he schedule this at least three months after surgical intervention of his left foot  Plan: -   Patient discussed with [name], [name], PGY-3 Phone: [phone] Date 11/12/2022 Time 12:57 PM

## 2022-11-13 NOTE — Progress Notes (Signed)
Internal Medicine Clinic Attending  Case discussed with Dr. Katsadouros  At the time of the visit.  We reviewed the resident's history and exam and pertinent patient test results.  I agree with the assessment, diagnosis, and plan of care documented in the resident's note.  

## 2022-11-19 ENCOUNTER — Encounter: Payer: Self-pay | Admitting: Infectious Diseases

## 2022-11-19 ENCOUNTER — Ambulatory Visit: Payer: BLUE CROSS/BLUE SHIELD | Admitting: Infectious Diseases

## 2022-11-19 ENCOUNTER — Other Ambulatory Visit: Payer: Self-pay

## 2022-11-19 ENCOUNTER — Telehealth: Payer: Self-pay

## 2022-11-19 VITALS — BP 121/79 | HR 81 | Temp 98.4°F | Ht 73.0 in | Wt 282.0 lb

## 2022-11-19 DIAGNOSIS — Z5181 Encounter for therapeutic drug level monitoring: Secondary | ICD-10-CM

## 2022-11-19 DIAGNOSIS — T847XXA Infection and inflammatory reaction due to other internal orthopedic prosthetic devices, implants and grafts, initial encounter: Secondary | ICD-10-CM | POA: Insufficient documentation

## 2022-11-19 DIAGNOSIS — T847XXD Infection and inflammatory reaction due to other internal orthopedic prosthetic devices, implants and grafts, subsequent encounter: Secondary | ICD-10-CM | POA: Diagnosis not present

## 2022-11-19 DIAGNOSIS — B955 Unspecified streptococcus as the cause of diseases classified elsewhere: Secondary | ICD-10-CM

## 2022-11-19 DIAGNOSIS — Z452 Encounter for adjustment and management of vascular access device: Secondary | ICD-10-CM | POA: Diagnosis not present

## 2022-11-19 DIAGNOSIS — R7881 Bacteremia: Secondary | ICD-10-CM | POA: Diagnosis not present

## 2022-11-19 NOTE — Telephone Encounter (Signed)
Per Dr. West Bali extend IV antibiotics to 3/19 and pull picc after last dose. Sent a community message to Carolynn Sayers, RN at [facility] about extension.

## 2022-11-19 NOTE — Progress Notes (Signed)
Patient Active Problem List   Diagnosis Date Noted   History of colonic polyps 11/12/2022   Cellulitis of left lower extremity 11/03/2022   Bacteremia due to Streptococcus 10/31/2022   Essential hypertension 10/30/2022   Tobacco dependence 10/30/2022   Closed displaced trimalleolar fracture of left ankle 10/02/2020   Current Outpatient Medications on File Prior to Visit  Medication Sig Dispense Refill   cefTRIAXone (ROCEPHIN) IVPB Inject 2 g into the vein daily for 25 days. Indication:  L-ankle infection First Dose: Yes Last Day of Therapy:  11/29/22 Labs - Once weekly:  CBC/D and BMP, Labs - Every other week:  ESR and CRP Method of administration: IV Push Pull PICC line at the completion of IV therayp Method of administration may be changed at the discretion of home infusion pharmacist based upon assessment of the patient and/or caregiver's ability to self-administer the medication ordered. 25 Units 0   ibuprofen (ADVIL) 200 MG tablet Take 800 mg by mouth every 8 (eight) hours as needed for headache, mild pain or moderate pain.     nicotine polacrilex (NICORETTE) 2 MG gum Take 1 each (2 mg total) by mouth as needed for smoking cessation. 100 tablet 0   oxymetazoline (AFRIN) 0.05 % nasal spray Place 1-2 sprays into both nostrils at bedtime as needed for congestion.     varenicline (CHANTIX) 0.5 MG tablet Take 1 tablet (0.5 mg total) by mouth daily for 3 days, THEN 1 tablet (0.5 mg total) 2 (two) times daily for 7 days, THEN 2 tablets (1 mg total) 2 (two) times daily. 137 tablet 0   No current facility-administered medications on file prior to visit.    Subjective: 47 year old male with PMH of hypertension, kidney stones, prior left tri-malleolar # s/p ORIF (10/02/2020) with secondary fall causing opening of wounds s/p 2 courses of cephalexin wh prior left tri-malleolar # s/p ORIF (10/02/2020)en seen by wound care [id number] then complicated with avascular necrosis s/p left ankle  arthrodesis (06/25/2021, OR cx no growth) who is here for HFU for Group G streptococcus bacteremia and associated left foot and leg cellulitis. Seen by Orthopedics and ID inpatient, no surgical intervention done and plan was to follow OP for possible orthopedic intervention. Patient was discharged on 2/20 to complete 4 weeks of IV ceftriaxone through PICC with possible transition to PO abtx until hardware is out.   11/19/22 Seen by Orthopedics every Monday since hospital discharge. Next visit is next Monday and there is a potential plan for removal of hardware next week per patient report. He is getting IV ceftriaxone from rt arm PICC without any issues or concerns. There is a small red spot at the site of PICC entry site but no pain, tenderness, swelling or warmth. Discussed to monitor for now and it any worsening to inform us immediately. Denies fevers, chills. Denies nausea, vomiting and diarrhea.   Review of Systems: all systems reviewed with pertinent positives and negatives as listed above  Past Medical History:  Diagnosis Date   Cellulitis    History of kidney stones    Hypertension    per pt, resolved after weight loss   Personal history of colonic polyps    Prediabetes    Past Surgical History:  Procedure Laterality Date   ANKLE ARTHROSCOPY WITH ARTHRODESIS Left 06/25/2021   Procedure: LEFT ANKLE ARTHRODESIS,;  Surgeon: Erle Crocker, MD;  Location: [place];  Service: Orthopedics;  Laterality: Left;   INGUINAL HERNIA REPAIR Right 2008  KNEE ARTHROSCOPY Right 1994   ORIF ANKLE FRACTURE Left 10/02/2020   Procedure: OPEN REDUCTION INTERNAL FIXATION (ORIF) ANKLE FRACTURE;  Surgeon: Nicholes Stairs, MD;  Location: [place];  Service: Orthopedics;  Laterality: Left;   VASECTOMY      Social History   Tobacco Use   Smoking status: Every Day    Packs/day: 1.00    Years: 27.00    Total pack years: 27.00    Types: Cigarettes   Smokeless tobacco: Never  Vaping Use   Vaping  Use: Some days   Substances: Nicotine  Substance Use Topics   Alcohol use: Yes    Comment: rare   Drug use: Never    Family History  Problem Relation Age of Onset   Skin cancer Mother    Heart murmur Father    Osteoarthritis Sister     No Known Allergies  Health Maintenance  Topic Date Due   COVID-19 Vaccine (1) Never done   Hepatitis C Screening  Never done   DTaP/Tdap/Td (1 - Tdap) Never done   COLONOSCOPY (Pts 45-9yr Insurance coverage will need to be confirmed)  Never done   INFLUENZA VACCINE  Never done   HIV Screening  Completed   HPV VACCINES  Aged Out    Objective:  Vitals:   11/19/22 1415  BP: 121/79  Pulse: 81  Temp: 98.4 F (36.9 C)  TempSrc: Temporal  SpO2: 96%  Weight: 282 lb (127.9 kg)  Height: '6\' 1"'$  (1.854 m)   Body mass index is 37.21 kg/m.  Physical Exam Constitutional:      Appearance: Normal appearance. Obese  HENT:     Head: Normocephalic and atraumatic.      Mouth: Mucous membranes are moist.  Eyes:    Conjunctiva/sclera: Conjunctivae normal.     Pupils: Pupils are equal, round, and bilaterally symmetrical    Cardiovascular:     Rate and Rhythm: Normal rate and regular rhythm.     Heart sounds: s1s2  Pulmonary:     Effort: Pulmonary effort is normal.     Breath sounds: Normal breath sounds.   Abdominal:     General: Non distended     Palpations: soft.   Musculoskeletal:        General: Left foot and leg with some edema and resolving cellulitis. No signs of worsening infection. Calves are soft. No overlying warmth, tenderness and fluctuance. Ambulatory with the help of cane.   Skin:    General: Skin is warm and dry.     Comments: Rt arm PICC ok except a small red spot at the entry site with no redness, swelling, warmth, tenderness  Neurological:     General: grossly non focal     Mental Status: awake, alert and oriented to person, place, and time.   Psychiatric:        Mood and Affect: Mood normal.   Lab  Results Lab Results  Component Value Date   WBC 9.1 11/04/2022   HGB 14.0 11/04/2022   HCT 42.6 11/04/2022   MCV 90.6 11/04/2022   PLT 248 11/04/2022    Lab Results  Component Value Date   CREATININE 0.97 11/04/2022   BUN 16 11/04/2022   NA 134 (L) 11/04/2022   K 4.8 11/04/2022   CL 99 11/04/2022   CO2 26 11/04/2022    Lab Results  Component Value Date   ALT 25 10/30/2022   AST 22 10/30/2022   ALKPHOS 75 10/30/2022   BILITOT 0.7 10/30/2022  No results found for: "CHOL", "HDL", "[place]", "LDLDIRECT", "TRIG", "CHOLHDL" No results found for: "LABRPR", "RPRTITER" No results found for: "HIV1RNAQUANT", "HIV1RNAVL", "CD4TABS"   Microbiology Results for orders placed or performed during the hospital encounter of 10/30/22  Culture, blood (routine x 2)     Status: Abnormal   Collection Time: 10/30/22 10:00 AM   Specimen: BLOOD RIGHT FOREARM  Result Value Ref Range Status   Specimen Description BLOOD RIGHT FOREARM  Final   Special Requests   Final    BOTTLES DRAWN AEROBIC AND ANAEROBIC Blood Culture adequate volume   Culture  Setup Time   Final    GRAM POSITIVE COCCI AEROBIC BOTTLE ONLY CRITICAL RESULT CALLED TO, READ BACK BY AND VERIFIED WITH: T RUDISILL,PHARMD'@0152'$  10/31/22 [place] Performed at [facility], [address] [place], [place] [address]    Culture STREPTOCOCCUS GROUP G (A)  Final   Report Status 11/02/2022 FINAL  Final   Organism ID, Bacteria STREPTOCOCCUS GROUP G  Final      Susceptibility   Streptococcus group g - MIC*    CLINDAMYCIN >=1 RESISTANT Resistant     AMPICILLIN <=0.25 SENSITIVE Sensitive     ERYTHROMYCIN >=8 RESISTANT Resistant     VANCOMYCIN 0.5 SENSITIVE Sensitive     CEFTRIAXONE <=0.12 SENSITIVE Sensitive     LEVOFLOXACIN 0.5 SENSITIVE Sensitive     PENICILLIN <=0.06 SENSITIVE Sensitive     * STREPTOCOCCUS GROUP G  Blood Culture ID Panel (Reflexed)     Status: Abnormal   Collection Time: 10/30/22 10:00 AM  Result Value Ref Range  Status   Enterococcus faecalis NOT DETECTED NOT DETECTED Final   Enterococcus Faecium NOT DETECTED NOT DETECTED Final   Listeria monocytogenes NOT DETECTED NOT DETECTED Final   Staphylococcus species NOT DETECTED NOT DETECTED Final   Staphylococcus aureus (BCID) NOT DETECTED NOT DETECTED Final   Staphylococcus epidermidis NOT DETECTED NOT DETECTED Final   Staphylococcus lugdunensis NOT DETECTED NOT DETECTED Final   Streptococcus species DETECTED (A) NOT DETECTED Final    Comment: Not Enterococcus species, Streptococcus agalactiae, Streptococcus pyogenes, or Streptococcus pneumoniae. CRITICAL RESULT CALLED TO, READ BACK BY AND VERIFIED WITH: T RUDISILL,PHARMD'@0152'$  10/31/22 [place]    Streptococcus agalactiae NOT DETECTED NOT DETECTED Final   Streptococcus pneumoniae NOT DETECTED NOT DETECTED Final   Streptococcus pyogenes NOT DETECTED NOT DETECTED Final   A.calcoaceticus-baumannii NOT DETECTED NOT DETECTED Final   Bacteroides fragilis NOT DETECTED NOT DETECTED Final   Enterobacterales NOT DETECTED NOT DETECTED Final   Enterobacter cloacae complex NOT DETECTED NOT DETECTED Final   Escherichia coli NOT DETECTED NOT DETECTED Final   Klebsiella aerogenes NOT DETECTED NOT DETECTED Final   Klebsiella oxytoca NOT DETECTED NOT DETECTED Final   Klebsiella pneumoniae NOT DETECTED NOT DETECTED Final   Proteus species NOT DETECTED NOT DETECTED Final   Salmonella species NOT DETECTED NOT DETECTED Final   Serratia marcescens NOT DETECTED NOT DETECTED Final   Haemophilus influenzae NOT DETECTED NOT DETECTED Final   Neisseria meningitidis NOT DETECTED NOT DETECTED Final   Pseudomonas aeruginosa NOT DETECTED NOT DETECTED Final   Stenotrophomonas maltophilia NOT DETECTED NOT DETECTED Final   Candida albicans NOT DETECTED NOT DETECTED Final   Candida auris NOT DETECTED NOT DETECTED Final   Candida glabrata NOT DETECTED NOT DETECTED Final   Candida krusei NOT DETECTED NOT DETECTED Final   Candida  parapsilosis NOT DETECTED NOT DETECTED Final   Candida tropicalis NOT DETECTED NOT DETECTED Final   Cryptococcus neoformans/gattii NOT DETECTED NOT DETECTED Final  Comment: Performed at [facility], [place] [address] [place], [place] [address]  Culture, blood (routine x 2)     Status: None   Collection Time: 10/30/22 10:07 AM   Specimen: BLOOD LEFT FOREARM  Result Value Ref Range Status   Specimen Description BLOOD LEFT FOREARM  Final   Special Requests   Final    BOTTLES DRAWN AEROBIC AND ANAEROBIC Blood Culture adequate volume   Culture   Final    NO GROWTH 5 DAYS Performed at [facility], [place] [address], [place], [place] [address]    Report Status 11/04/2022 FINAL  Final  Culture, blood (Routine X 2) w Reflex to ID Panel     Status: None   Collection Time: 11/01/22  6:30 AM   Specimen: BLOOD LEFT ARM  Result Value Ref Range Status   Specimen Description BLOOD LEFT ARM  Final   Special Requests   Final    BOTTLES DRAWN AEROBIC AND ANAEROBIC Blood Culture results may not be optimal due to an excessive volume of blood received in culture bottles   Culture   Final    NO GROWTH 5 DAYS Performed at [facility], [place] [address], [place], [place] [address]    Report Status 11/06/2022 FINAL  Final  Culture, blood (Routine X 2) w Reflex to ID Panel     Status: None   Collection Time: 11/01/22  6:30 AM   Specimen: BLOOD RIGHT ARM  Result Value Ref Range Status   Specimen Description BLOOD RIGHT ARM  Final   Special Requests   Final    BOTTLES DRAWN AEROBIC AND ANAEROBIC Blood Culture results may not be optimal due to an excessive volume of blood received in culture bottles   Culture   Final    NO GROWTH 5 DAYS Performed at [facility], [place] [address] [place], [place] [address]    Report Status 11/06/2022 FINAL  Final   Imaging  CT left ankle 11/03/22 IMPRESSION: 1. Small amount of fluid within the subcutaneous soft tissues at the medial aspect of the  lower leg at the level of the distal tibial metaphysis measuring approximately 13 x 9 x 13 mm. A small sinus tract extending to the underlying tibial cortex was better seen by previous MRI. No erosion or aggressive periosteal elevation at this location to suggest acute osteomyelitis. 2. Generalized soft tissue edema and fluid. No additional organized fluid collections are evident. No soft tissue gas. 3. Redemonstrated postsurgical changes from prior distal tibial and fibular ORIF and tibiotalar arthrodesis.  Left ankle MRI 10/31/22 IMPRESSION 1. Extensive postsurgical changes from previous ORIF of the distal tibia and fibula with subsequent tibiotalar arthrodesis. The tibiotalar ankylosis appears solid. 2. Subcutaneous edema within the distal lower leg with an apparent small skin wound medial to the distal tibial metaphysis. Suspected underlying sinus tract extending to the bone with mild surrounding peripheral enhancement, suspicious for soft tissue infection, suboptimally evaluated due to artifact from the adjacent hardware. No drainable fluid collection identified. 3. No evidence of osteomyelitis. 4. The ankle tendons and ligaments appear intact. 5. Given the artifact associated with the hardware, follow-up with CT may be helpful.  Assessment/Plan  # Cellulitis of Left leg/foot with sinus tract and possible osteomyelitis complicated with hardware # Group G streptococcus bacteremia  H/o prior left tri-malleolar # s/p ORIF (10/02/2020) with secondary fall causing opening of wounds s/p 2 courses of cephalexin wh prior left tri-malleolar # s/p ORIF (10/02/2020)en seen  by wound care [id number] then complicated with avascular necrosis s/p left ankle arthrodesis (06/25/2021, OR cx no growth)  Plan  Will extend IV ceftriaxone until next clinic visit 12/02/22 He had labs drawn with [facility] yesterday ( unavailable ) and have been requested  Fu with Orthopedics for possible surgical intervention  Will  plan to switch to PO cefadroxil next visit to complete 6 weeks course if all hardware all out. If persistent hardware, he will need to be on PO abtx indefinitely  # PICC  Small red spot noted at the entry site, no other symptoms  Discussed to monitor for now  # Smoker - encouraged to continue on cutting down on cigarettes   I have personally spent 42 minutes involved in face-to-face and non-face-to-face activities for this patient on the day of the visit. Professional time spent includes the following activities: Preparing to see the patient (review of tests), Obtaining and/or reviewing separately obtained history (admission/discharge record), Performing a medically appropriate examination and/or evaluation , Ordering medications/tests/procedures, referring and communicating with other health care professionals, Documenting clinical information in the EMR, Independently interpreting results (not separately reported), Communicating results to the patient/family/caregiver, Counseling and educating the patient/family/caregiver and Care coordination (not separately reported).   Wilber Oliphant, [place] [facility] 11/19/2022, 2:35 PM

## 2022-11-24 ENCOUNTER — Other Ambulatory Visit: Payer: Self-pay | Admitting: Orthopaedic Surgery

## 2022-12-02 ENCOUNTER — Ambulatory Visit (INDEPENDENT_AMBULATORY_CARE_PROVIDER_SITE_OTHER): Payer: Worker's Compensation | Admitting: Infectious Diseases

## 2022-12-02 ENCOUNTER — Other Ambulatory Visit: Payer: Self-pay

## 2022-12-02 ENCOUNTER — Encounter: Payer: Self-pay | Admitting: Infectious Diseases

## 2022-12-02 ENCOUNTER — Telehealth: Payer: Self-pay

## 2022-12-02 VITALS — BP 131/83 | HR 88 | Resp 16 | Ht 73.0 in | Wt 286.0 lb

## 2022-12-02 DIAGNOSIS — T847XXD Infection and inflammatory reaction due to other internal orthopedic prosthetic devices, implants and grafts, subsequent encounter: Secondary | ICD-10-CM

## 2022-12-02 DIAGNOSIS — Z452 Encounter for adjustment and management of vascular access device: Secondary | ICD-10-CM | POA: Diagnosis not present

## 2022-12-02 DIAGNOSIS — Z5181 Encounter for therapeutic drug level monitoring: Secondary | ICD-10-CM | POA: Diagnosis not present

## 2022-12-02 MED ORDER — LINEZOLID 600 MG PO TABS: 600.0000 mg | ORAL_TABLET | Freq: Two times a day (BID) | ORAL | 0 refills | Status: DC

## 2022-12-02 NOTE — Telephone Encounter (Signed)
Spoke to [facility] at [facility] and requested labs be faxed.

## 2022-12-02 NOTE — Telephone Encounter (Signed)
Sent to [facility]: Per provider ok to [facility] after 12/02/2022 Provider: Dr West Bali  End Date:12/02/2022   Notified [place] and Ysidro Evert

## 2022-12-02 NOTE — Progress Notes (Signed)
Patient Active Problem List   Diagnosis Date Noted   Medication monitoring encounter 11/19/2022   PICC (peripherally inserted central catheter) in place [id number]   Hardware complicating wound infection ([place]) 11/19/2022   History of colonic polyps 11/12/2022   Cellulitis of left lower extremity 11/03/2022   Bacteremia due to Streptococcus 10/31/2022   Essential hypertension 10/30/2022   Tobacco dependence 10/30/2022   Closed displaced trimalleolar fracture of left ankle 10/02/2020   Current Outpatient Medications on File Prior to Visit  Medication Sig Dispense Refill   cefTRIAXone (ROCEPHIN) IVPB Inject into the vein.     Heparin Sod, Pork, Lock Flush (HEPARIN, PORCINE, LOCK FLUSH IV) Inject into the vein.     ibuprofen (ADVIL) 200 MG tablet Take 800 mg by mouth every 8 (eight) hours as needed for headache, mild pain or moderate pain.     nicotine polacrilex (NICORETTE) 2 MG gum Take 1 each (2 mg total) by mouth as needed for smoking cessation. 100 tablet 0   oxymetazoline (AFRIN) 0.05 % nasal spray Place 1-2 sprays into both nostrils at bedtime as needed for congestion.     varenicline (CHANTIX) 0.5 MG tablet Take 1 tablet (0.5 mg total) by mouth daily for 3 days, THEN 1 tablet (0.5 mg total) 2 (two) times daily for 7 days, THEN 2 tablets (1 mg total) 2 (two) times daily. 137 tablet 0   No current facility-administered medications on file prior to visit.    Subjective: 47 year old male with PMH of hypertension, kidney stones, prior left tri-malleolar # s/p ORIF (10/02/2020) with secondary fall causing opening of wounds s/p 2 courses of cephalexin wh prior left tri-malleolar # s/p ORIF (10/02/2020)en seen by wound care [id number] then complicated with avascular necrosis s/p left ankle arthrodesis (06/25/2021, OR cx no growth) who is here for HFU for Group G streptococcus bacteremia and associated left foot and leg cellulitis. Seen by Orthopedics and ID inpatient, no surgical intervention  done and plan was to follow OP for possible orthopedic intervention. Patient was discharged on 2/20 to complete 4 weeks of IV ceftriaxone through PICC with possible transition to PO abtx until hardware is out.   11/19/22 Seen by Orthopedics every Monday since hospital discharge. Next visit is next Monday and there is a potential plan for removal of hardware next week per patient report. He is getting IV ceftriaxone from rt arm PICC without any issues or concerns. There is a small red spot at the site of PICC entry site but no pain, tenderness, swelling or warmth. Discussed to monitor for now and it any worsening to inform us immediately. Denies fevers, chills. Denies nausea, vomiting and diarrhea.   12/02/22 He is getting IV cefazolin through rt arm PICC without concerns related to PICC or abtx., Left leg swelling has gone down significantly. Denies nausea, vomiting and diarrhea. Denies fevers and chills. He reports swelling in the left leg has significantly gone down and thinks IV abtx has really helped. He is interested to continue IV abtx. His surgical intervention with Orthopedics got postponed to 3/21. Doing well with no concerns otherwise.   Review of Systems: all systems reviewed with pertinent positives and negatives as listed above  Past Medical History:  Diagnosis Date   Cellulitis    History of kidney stones    Hypertension    per pt, resolved after weight loss   Personal history of colonic polyps    Prediabetes    Past Surgical History:  Procedure Laterality Date  ANKLE ARTHROSCOPY WITH ARTHRODESIS Left 06/25/2021   Procedure: LEFT ANKLE ARTHRODESIS,;  Surgeon: Erle Crocker, MD;  Location: [place];  Service: Orthopedics;  Laterality: Left;   INGUINAL HERNIA REPAIR Right 2008   KNEE ARTHROSCOPY Right 1994   ORIF ANKLE FRACTURE Left 10/02/2020   Procedure: OPEN REDUCTION INTERNAL FIXATION (ORIF) ANKLE FRACTURE;  Surgeon: Nicholes Stairs, MD;  Location: [place];  Service:  Orthopedics;  Laterality: Left;   VASECTOMY      Social History   Tobacco Use   Smoking status: Every Day    Packs/day: 1.00    Years: 27.00    Additional pack years: 0.00    Total pack years: 27.00    Types: Cigarettes    Passive exposure: Past   Smokeless tobacco: Never  Vaping Use   Vaping Use: Some days   Substances: Nicotine, Flavoring  Substance Use Topics   Alcohol use: Yes    Comment: rare   Drug use: Never    Family History  Problem Relation Age of Onset   Skin cancer Mother    Heart murmur Father    Osteoarthritis Sister     No Known Allergies  Health Maintenance  Topic Date Due   Hepatitis C Screening  Never done   COLONOSCOPY (Pts 45-38yrs Insurance coverage will need to be confirmed)  Never done   INFLUENZA VACCINE  12/14/2022 (Originally 04/15/2022)   COVID-19 Vaccine (1) 12/18/2022 (Originally 10/22/1976)   HIV Screening  Completed   HPV VACCINES  Aged Out   DTaP/Tdap/Td  Discontinued    Objective:  Vitals:   12/02/22 1018  BP: 131/83  Pulse: 88  Resp: 16  SpO2: 93%  Weight: 286 lb (129.7 kg)  Height: 6\' 1"  (1.854 m)   Body mass index is 37.73 kg/m.  Physical Exam Constitutional:      Appearance: Normal appearance. Obese  HENT:     Head: Normocephalic and atraumatic.      Mouth: Mucous membranes are moist.  Eyes:    Conjunctiva/sclera: Conjunctivae normal.     Pupils: Pupils are equal, round, and bilaterally symmetrical    Cardiovascular:     Rate and Rhythm: Normal rate and regular rhythm.     Heart sounds: s1s2  Pulmonary:     Effort: Pulmonary effort is normal.     Breath sounds: Normal breath sounds.   Abdominal:     General: Non distended     Palpations: soft.   Musculoskeletal:        General: Left foot and leg with with significantly improved edema. Cellulitis has resolved. Ambulatory   Skin:    General: Skin is warm and dry.     Comments: Rt arm PICC ok with no concerns  Neurological:     General: grossly non  focal     Mental Status: awake, alert and oriented to person, place, and time.   Psychiatric:        Mood and Affect: Mood normal.   Lab Results Lab Results  Component Value Date   WBC 9.1 11/04/2022   HGB 14.0 11/04/2022   HCT 42.6 11/04/2022   MCV 90.6 11/04/2022   PLT 248 11/04/2022    Lab Results  Component Value Date   CREATININE 0.97 11/04/2022   BUN 16 11/04/2022   NA 134 (L) 11/04/2022   K 4.8 11/04/2022   CL 99 11/04/2022   CO2 26 11/04/2022    Lab Results  Component Value Date   ALT 25 10/30/2022  AST 22 10/30/2022   ALKPHOS 75 10/30/2022   BILITOT 0.7 10/30/2022    No results found for: "CHOL", "HDL", "LDLCALC", "LDLDIRECT", "TRIG", "CHOLHDL" No results found for: "LABRPR", "RPRTITER" No results found for: "HIV1RNAQUANT", "HIV1RNAVL", "CD4TABS"   Microbiology Results for orders placed or performed during the hospital encounter of 10/30/22  Culture, blood (routine x 2)     Status: Abnormal   Collection Time: 10/30/22 10:00 AM   Specimen: BLOOD RIGHT FOREARM  Result Value Ref Range Status   Specimen Description BLOOD RIGHT FOREARM  Final   Special Requests   Final    BOTTLES DRAWN AEROBIC AND ANAEROBIC Blood Culture adequate volume   Culture  Setup Time   Final    GRAM POSITIVE COCCI AEROBIC BOTTLE ONLY CRITICAL RESULT CALLED TO, READ BACK BY AND VERIFIED WITH: T RUDISILL,PHARMD@[email or web address]  10/31/22 [place] Performed at [facility], [address], [place], [place] [address]    Culture STREPTOCOCCUS GROUP G (A)  Final   Report Status 11/02/2022 FINAL  Final   Organism ID, Bacteria STREPTOCOCCUS GROUP G  Final      Susceptibility   Streptococcus group g - MIC*    CLINDAMYCIN >=1 RESISTANT Resistant     AMPICILLIN <=0.25 SENSITIVE Sensitive     ERYTHROMYCIN >=8 RESISTANT Resistant     VANCOMYCIN 0.5 SENSITIVE Sensitive     CEFTRIAXONE <=0.12 SENSITIVE Sensitive     LEVOFLOXACIN 0.5 SENSITIVE Sensitive     PENICILLIN <=0.06 SENSITIVE Sensitive     *  STREPTOCOCCUS GROUP G  Blood Culture ID Panel (Reflexed)     Status: Abnormal   Collection Time: 10/30/22 10:00 AM  Result Value Ref Range Status   Enterococcus faecalis NOT DETECTED NOT DETECTED Final   Enterococcus Faecium NOT DETECTED NOT DETECTED Final   Listeria monocytogenes NOT DETECTED NOT DETECTED Final   Staphylococcus species NOT DETECTED NOT DETECTED Final   Staphylococcus aureus (BCID) NOT DETECTED NOT DETECTED Final   Staphylococcus epidermidis NOT DETECTED NOT DETECTED Final   Staphylococcus lugdunensis NOT DETECTED NOT DETECTED Final   Streptococcus species DETECTED (A) NOT DETECTED Final    Comment: Not Enterococcus species, Streptococcus agalactiae, Streptococcus pyogenes, or Streptococcus pneumoniae. CRITICAL RESULT CALLED TO, READ BACK BY AND VERIFIED WITH: T RUDISILL,PHARMD@0152  10/31/22 [place]    Streptococcus agalactiae NOT DETECTED NOT DETECTED Final   Streptococcus pneumoniae NOT DETECTED NOT DETECTED Final   Streptococcus pyogenes NOT DETECTED NOT DETECTED Final   A.calcoaceticus-baumannii NOT DETECTED NOT DETECTED Final   Bacteroides fragilis NOT DETECTED NOT DETECTED Final   Enterobacterales NOT DETECTED NOT DETECTED Final   Enterobacter cloacae complex NOT DETECTED NOT DETECTED Final   Escherichia coli NOT DETECTED NOT DETECTED Final   Klebsiella aerogenes NOT DETECTED NOT DETECTED Final   Klebsiella oxytoca NOT DETECTED NOT DETECTED Final   Klebsiella pneumoniae NOT DETECTED NOT DETECTED Final   Proteus species NOT DETECTED NOT DETECTED Final   Salmonella species NOT DETECTED NOT DETECTED Final   Serratia marcescens NOT DETECTED NOT DETECTED Final   Haemophilus influenzae NOT DETECTED NOT DETECTED Final   Neisseria meningitidis NOT DETECTED NOT DETECTED Final   Pseudomonas aeruginosa NOT DETECTED NOT DETECTED Final   Stenotrophomonas maltophilia NOT DETECTED NOT DETECTED Final   Candida albicans NOT DETECTED NOT DETECTED Final   Candida auris NOT DETECTED  NOT DETECTED Final   Candida glabrata NOT DETECTED NOT DETECTED Final   Candida krusei NOT DETECTED NOT DETECTED Final   Candida parapsilosis NOT DETECTED NOT DETECTED Final   Candida  tropicalis NOT DETECTED NOT DETECTED Final   Cryptococcus neoformans/gattii NOT DETECTED NOT DETECTED Final    Comment: Performed at [facility], [place] [address] [place], [place] [address]  Culture, blood (routine x 2)     Status: None   Collection Time: 10/30/22 10:07 AM   Specimen: BLOOD LEFT FOREARM  Result Value Ref Range Status   Specimen Description BLOOD LEFT FOREARM  Final   Special Requests   Final    BOTTLES DRAWN AEROBIC AND ANAEROBIC Blood Culture adequate volume   Culture   Final    NO GROWTH 5 DAYS Performed at [facility], [place] [address], [place], [place] [address]    Report Status 11/04/2022 FINAL  Final  Culture, blood (Routine X 2) w Reflex to ID Panel     Status: None   Collection Time: 11/01/22  6:30 AM   Specimen: BLOOD LEFT ARM  Result Value Ref Range Status   Specimen Description BLOOD LEFT ARM  Final   Special Requests   Final    BOTTLES DRAWN AEROBIC AND ANAEROBIC Blood Culture results may not be optimal due to an excessive volume of blood received in culture bottles   Culture   Final    NO GROWTH 5 DAYS Performed at [facility], [place] [address], [place], [place] [address]    Report Status 11/06/2022 FINAL  Final  Culture, blood (Routine X 2) w Reflex to ID Panel     Status: None   Collection Time: 11/01/22  6:30 AM   Specimen: BLOOD RIGHT ARM  Result Value Ref Range Status   Specimen Description BLOOD RIGHT ARM  Final   Special Requests   Final    BOTTLES DRAWN AEROBIC AND ANAEROBIC Blood Culture results may not be optimal due to an excessive volume of blood received in culture bottles   Culture   Final    NO GROWTH 5 DAYS Performed at [facility], [place] [address] [place], [place] [address]    Report Status 11/06/2022 FINAL  Final   Imaging    Assessment/Plan  # Cellulitis of Left leg/foot with sinus tract and possible osteomyelitis complicated with hardware # Group G streptococcus bacteremia  H/o prior left tri-malleolar # s/p ORIF (10/02/2020) with secondary fall causing opening of wounds s/p 2 courses of cephalexin wh prior left tri-malleolar # s/p ORIF (10/02/2020)en seen by wound care [id number] then complicated with avascular necrosis s/p left ankle arthrodesis (06/25/2021, OR cx no growth) S/p ceftriaxone until today   Plan  Will switch ceftriaxone to Linezolid for now pending orthopedic intervention with hardware removal planned for 3/21 He will not need further abtx if all hardware out  Fu in a week   # PICC  Will coordinate with [facility] for PICC removal   # Medication management  - Labs from [facility] requested ( not available today)  I have personally spent 42 minutes involved in face-to-face and non-face-to-face activities for this patient on the day of the visit. Professional time spent includes the following activities: Preparing to see the patient (review of tests), Obtaining and/or reviewing separately obtained history (admission/discharge record), Performing a medically appropriate examination and/or evaluation , Ordering medications/tests/procedures, referring and communicating with other health care professionals, Documenting clinical information in the EMR, Independently interpreting results (not separately reported), Communicating results to the patient/family/caregiver, Counseling and educating the patient/family/caregiver and Care coordination (not separately reported).   Wilber Oliphant, [facility]  12/02/2022, 10:37 AM

## 2022-12-03 ENCOUNTER — Other Ambulatory Visit: Payer: Self-pay

## 2022-12-03 ENCOUNTER — Encounter (HOSPITAL_COMMUNITY): Payer: Self-pay | Admitting: Orthopaedic Surgery

## 2022-12-03 NOTE — Progress Notes (Addendum)
PCP - Denies Cardiologist - Denies  PPM/ICD - Denies  Chest x-ray - n/a EKG - DOS, 12/04/2022 Stress Test -  ECHO -  Cardiac Cath -   CPAP - Denies  Non-diabetic  Blood Thinner Instructions: Denies Aspirin Instructions: Denies  ERAS Protcol - Yes, clear liquids until 3 hours prior to surgery  COVID TEST- Denies  Anesthesia review: No  Patient verbally denies any shortness of breath, fever, cough and chest pain during phone call   -------------  SDW INSTRUCTIONS given:  Your procedure is scheduled on Thursday December 04, 2022.  Report to [facility] Main Entrance "A" at 1100 A.M., and check in at the Admitting office.  Call this number if you have problems the morning of surgery:  [phone]   Remember:  Do not eat after midnight the night before your surgery  You may drink clear liquids until 1030 the morning of your surgery.   Clear liquids allowed are: Water, Non-Citrus Juices (without pulp), Carbonated Beverages, Clear Tea, Black Coffee Only, and Gatorade    Take these medicines the morning of surgery with A SIP OF WATER Linezolid, chantix  As of today, STOP taking any Aspirin (unless otherwise instructed by your surgeon) Aleve, Naproxen, Ibuprofen, Motrin, Advil, Goody's, BC's, all herbal medications, fish oil, and all vitamins.                      Do not wear lotions, powders, colognes, or deodorant. Men may shave face and neck.            Do not bring valuables to the hospital.            [facility] is not responsible for any belongings or valuables.  Do NOT Smoke (Tobacco/Vaping) 24 hours prior to your procedure If you use a CPAP at night, you may bring all equipment for your overnight stay.   Contacts, glasses, dentures or bridgework may not be worn into surgery.      For patients admitted to the hospital, discharge time will be determined by your treatment team.   Patients discharged the day of surgery will not be allowed to drive home, and someone  needs to stay with them for 24 hours.    Special instructions:   [place]- Preparing For Surgery  Before surgery, you can play an important role. Because skin is not sterile, your skin needs to be as free of germs as possible. You can reduce the number of germs on your skin by washing with Dial Soap before surgery.    Oral Hygiene is also important to reduce your risk of infection.  Remember - BRUSH YOUR TEETH THE MORNING OF SURGERY  Please follow these instructions carefully.   Shower the NIGHT BEFORE SURGERY and the MORNING OF SURGERY with DIAL Soap.   Pat yourself dry with a CLEAN TOWEL.  Wear CLEAN PAJAMAS to bed the night before surgery  Place CLEAN SHEETS on your bed the night of your first shower and DO NOT SLEEP WITH PETS.   Day of Surgery: Please shower morning of surgery  Wear Clean/Comfortable clothing the morning of surgery Do not apply any deodorants/lotions.   Remember to brush your teeth WITH YOUR REGULAR TOOTHPASTE.   Questions were answered. Patient verbalized understanding of instructions.

## 2022-12-04 ENCOUNTER — Other Ambulatory Visit: Payer: Self-pay

## 2022-12-04 ENCOUNTER — Ambulatory Visit (HOSPITAL_COMMUNITY): Payer: Worker's Compensation

## 2022-12-04 ENCOUNTER — Ambulatory Visit (HOSPITAL_COMMUNITY)
Admission: RE | Admit: 2022-12-04 | Discharge: 2022-12-04 | Disposition: A | Discharge status: Home / Self Care | Payer: Worker's Compensation | Attending: Orthopaedic Surgery | Admitting: Orthopaedic Surgery

## 2022-12-04 ENCOUNTER — Ambulatory Visit (HOSPITAL_BASED_OUTPATIENT_CLINIC_OR_DEPARTMENT_OTHER): Payer: Worker's Compensation

## 2022-12-04 ENCOUNTER — Encounter (HOSPITAL_COMMUNITY): Payer: Self-pay | Admitting: Orthopaedic Surgery

## 2022-12-04 ENCOUNTER — Encounter (HOSPITAL_COMMUNITY)
Admission: RE | Disposition: A | Discharge status: Home / Self Care | Payer: Self-pay | Source: Home / Self Care | Attending: Orthopaedic Surgery

## 2022-12-04 DIAGNOSIS — Z96662 Presence of left artificial ankle joint: Secondary | ICD-10-CM | POA: Insufficient documentation

## 2022-12-04 DIAGNOSIS — L03116 Cellulitis of left lower limb: Secondary | ICD-10-CM | POA: Insufficient documentation

## 2022-12-04 DIAGNOSIS — T8459XA Infection and inflammatory reaction due to other internal joint prosthesis, initial encounter: Secondary | ICD-10-CM | POA: Diagnosis not present

## 2022-12-04 DIAGNOSIS — T8484XA Pain due to internal orthopedic prosthetic devices, implants and grafts, initial encounter: Secondary | ICD-10-CM | POA: Diagnosis not present

## 2022-12-04 DIAGNOSIS — I10 Essential (primary) hypertension: Secondary | ICD-10-CM | POA: Diagnosis not present

## 2022-12-04 HISTORY — PX: HARDWARE REMOVAL: SHX979

## 2022-12-04 SURGERY — REMOVAL, HARDWARE
Anesthesia: General | Laterality: Left

## 2022-12-04 MED ORDER — VANCOMYCIN HCL 1000 MG IV SOLR
INTRAVENOUS | Status: DC | PRN
  Administered 2022-12-04: 1000 mg

## 2022-12-04 MED ORDER — PROPOFOL 10 MG/ML IV BOLUS
INTRAVENOUS | Status: AC
  Filled 2022-12-04: qty 20

## 2022-12-04 MED ORDER — LABETALOL HCL 5 MG/ML IV SOLN
INTRAVENOUS | Status: AC
  Filled 2022-12-04: qty 4

## 2022-12-04 MED ORDER — DEXMEDETOMIDINE HCL IN NACL 80 MCG/20ML IV SOLN
INTRAVENOUS | Status: DC | PRN
  Administered 2022-12-04 (×2): 10 ug via INTRAVENOUS

## 2022-12-04 MED ORDER — OXYCODONE HCL 5 MG PO TABS: ORAL_TABLET | ORAL | 0 refills | Status: DC

## 2022-12-04 MED ORDER — ORAL CARE MOUTH RINSE: 15.0000 mL | Freq: Once | OROMUCOSAL | Status: AC

## 2022-12-04 MED ORDER — HYDROMORPHONE HCL 1 MG/ML IJ SOLN
INTRAMUSCULAR | Status: DC | PRN
  Administered 2022-12-04: .5 mg via INTRAVENOUS

## 2022-12-04 MED ORDER — FENTANYL CITRATE (PF) 250 MCG/5ML IJ SOLN
INTRAMUSCULAR | Status: DC | PRN
  Administered 2022-12-04 (×2): 100 ug via INTRAVENOUS
  Administered 2022-12-04: 50 ug via INTRAVENOUS

## 2022-12-04 MED ORDER — VANCOMYCIN HCL 1000 MG IV SOLR
INTRAVENOUS | Status: AC
  Filled 2022-12-04: qty 20

## 2022-12-04 MED ORDER — HYDROMORPHONE HCL 1 MG/ML IJ SOLN
0.2500 mg | INTRAMUSCULAR | Status: DC | PRN
  Administered 2022-12-04 (×4): 0.5 mg via INTRAVENOUS

## 2022-12-04 MED ORDER — DEXAMETHASONE SODIUM PHOSPHATE 10 MG/ML IJ SOLN
INTRAMUSCULAR | Status: DC | PRN
  Administered 2022-12-04: 10 mg via INTRAVENOUS

## 2022-12-04 MED ORDER — CEFAZOLIN IN SODIUM CHLORIDE 3-0.9 GM/100ML-% IV SOLN
3.0000 g | INTRAVENOUS | Status: AC
  Administered 2022-12-04: 3 g via INTRAVENOUS
  Filled 2022-12-04: qty 100

## 2022-12-04 MED ORDER — ROCURONIUM BROMIDE 10 MG/ML (PF) SYRINGE
PREFILLED_SYRINGE | INTRAVENOUS | Status: DC | PRN
  Administered 2022-12-04: 30 mg via INTRAVENOUS
  Administered 2022-12-04: 70 mg via INTRAVENOUS
  Administered 2022-12-04: 20 mg via INTRAVENOUS

## 2022-12-04 MED ORDER — PHENYLEPHRINE 80 MCG/ML (10ML) SYRINGE FOR IV PUSH (FOR BLOOD PRESSURE SUPPORT)
PREFILLED_SYRINGE | INTRAVENOUS | Status: AC
  Filled 2022-12-04: qty 10

## 2022-12-04 MED ORDER — ROCURONIUM BROMIDE 10 MG/ML (PF) SYRINGE
PREFILLED_SYRINGE | INTRAVENOUS | Status: AC
  Filled 2022-12-04: qty 10

## 2022-12-04 MED ORDER — ONDANSETRON HCL 4 MG/2ML IJ SOLN
INTRAMUSCULAR | Status: DC | PRN
  Administered 2022-12-04: 4 mg via INTRAVENOUS

## 2022-12-04 MED ORDER — 0.9 % SODIUM CHLORIDE (POUR BTL) OPTIME
TOPICAL | Status: DC | PRN
  Administered 2022-12-04: 1000 mL

## 2022-12-04 MED ORDER — METHOCARBAMOL 750 MG PO TABS: ORAL_TABLET | ORAL | 0 refills | Status: AC

## 2022-12-04 MED ORDER — MIDAZOLAM HCL 2 MG/2ML IJ SOLN
INTRAMUSCULAR | Status: AC
  Filled 2022-12-04: qty 2

## 2022-12-04 MED ORDER — LIDOCAINE 2% (20 MG/ML) 5 ML SYRINGE
INTRAMUSCULAR | Status: DC | PRN
  Administered 2022-12-04: 60 mg via INTRAVENOUS

## 2022-12-04 MED ORDER — OXYCODONE HCL 5 MG PO TABS
ORAL_TABLET | ORAL | Status: AC
  Filled 2022-12-04: qty 1

## 2022-12-04 MED ORDER — ASPIRIN 325 MG PO TABS: ORAL_TABLET | ORAL | 0 refills | Status: AC

## 2022-12-04 MED ORDER — OXYCODONE HCL 5 MG PO TABS
5.0000 mg | ORAL_TABLET | Freq: Once | ORAL | Status: AC | PRN
  Administered 2022-12-04: 5 mg via ORAL

## 2022-12-04 MED ORDER — OXYCODONE HCL 5 MG/5ML PO SOLN: 5.0000 mg | Freq: Once | ORAL | Status: AC | PRN

## 2022-12-04 MED ORDER — FENTANYL CITRATE (PF) 250 MCG/5ML IJ SOLN
INTRAMUSCULAR | Status: AC
  Filled 2022-12-04: qty 5

## 2022-12-04 MED ORDER — HYDROMORPHONE HCL 1 MG/ML IJ SOLN
INTRAMUSCULAR | Status: AC
  Filled 2022-12-04: qty 2

## 2022-12-04 MED ORDER — SUGAMMADEX SODIUM 200 MG/2ML IV SOLN
INTRAVENOUS | Status: DC | PRN
  Administered 2022-12-04: 200 mg via INTRAVENOUS

## 2022-12-04 MED ORDER — PROPOFOL 10 MG/ML IV BOLUS
INTRAVENOUS | Status: DC | PRN
  Administered 2022-12-04: 200 mg via INTRAVENOUS

## 2022-12-04 MED ORDER — LACTATED RINGERS IV SOLN: INTRAVENOUS | Status: DC

## 2022-12-04 MED ORDER — ONDANSETRON HCL 4 MG/2ML IJ SOLN: 4.0000 mg | Freq: Once | INTRAMUSCULAR | Status: DC | PRN

## 2022-12-04 MED ORDER — CHLORHEXIDINE GLUCONATE 0.12 % MT SOLN
15.0000 mL | Freq: Once | OROMUCOSAL | Status: AC
  Administered 2022-12-04: 15 mL via OROMUCOSAL
  Filled 2022-12-04: qty 15

## 2022-12-04 MED ORDER — MIDAZOLAM HCL 2 MG/2ML IJ SOLN
INTRAMUSCULAR | Status: DC | PRN
  Administered 2022-12-04: 2 mg via INTRAVENOUS

## 2022-12-04 MED ORDER — HYDROMORPHONE HCL 1 MG/ML IJ SOLN
INTRAMUSCULAR | Status: AC
  Filled 2022-12-04: qty 0.5

## 2022-12-04 MED ORDER — PHENYLEPHRINE 80 MCG/ML (10ML) SYRINGE FOR IV PUSH (FOR BLOOD PRESSURE SUPPORT)
PREFILLED_SYRINGE | INTRAVENOUS | Status: DC | PRN
  Administered 2022-12-04: 80 ug via INTRAVENOUS

## 2022-12-04 SURGICAL SUPPLY — 58 items
APL PRP STRL LF DISP 70% ISPRP (MISCELLANEOUS) ×2
BANDAGE ESMARK 6X9 LF (GAUZE/BANDAGES/DRESSINGS) IMPLANT
BLADE SURG 15 STRL LF DISP TIS (BLADE) ×2 IMPLANT
BLADE SURG 15 STRL SS (BLADE) ×4
BNDG CMPR 9X6 STRL LF SNTH (GAUZE/BANDAGES/DRESSINGS)
BNDG ELASTIC 4X5.8 VLCR STR LF (GAUZE/BANDAGES/DRESSINGS) IMPLANT
BNDG ELASTIC 6X5.8 VLCR STR LF (GAUZE/BANDAGES/DRESSINGS) IMPLANT
BNDG ESMARK 6X9 LF (GAUZE/BANDAGES/DRESSINGS)
CHLORAPREP W/TINT 26 (MISCELLANEOUS) ×1 IMPLANT
CUFF TOURN SGL QUICK 34 (TOURNIQUET CUFF) ×1
CUFF TRNQT CYL 34X4.125X (TOURNIQUET CUFF) IMPLANT
DRAPE C-ARM 42X120 X-RAY (DRAPES) IMPLANT
DRAPE IMP U-DRAPE 54X76 (DRAPES) ×1 IMPLANT
DRAPE OEC MINIVIEW 54X84 (DRAPES) ×1 IMPLANT
DRAPE U-SHAPE 47X51 STRL (DRAPES) ×1 IMPLANT
ELECT REM PT RETURN 9FT ADLT (ELECTROSURGICAL) ×1
ELECTRODE REM PT RTRN 9FT ADLT (ELECTROSURGICAL) ×1 IMPLANT
FIBULOCK IMPLANT SYSTEM STER (Miscellaneous) ×1 IMPLANT
GAUZE PAD ABD 8X10 STRL (GAUZE/BANDAGES/DRESSINGS) IMPLANT
GAUZE SPONGE 4X4 12PLY STRL (GAUZE/BANDAGES/DRESSINGS) ×1 IMPLANT
GAUZE XEROFORM 1X8 LF (GAUZE/BANDAGES/DRESSINGS) IMPLANT
GLOVE BIOGEL M STRL SZ7.5 (GLOVE) ×1 IMPLANT
GLOVE BIOGEL PI IND STRL 8 (GLOVE) ×1 IMPLANT
GOWN STRL REUS W/ TWL LRG LVL3 (GOWN DISPOSABLE) ×1 IMPLANT
GOWN STRL REUS W/ TWL XL LVL3 (GOWN DISPOSABLE) ×1 IMPLANT
GOWN STRL REUS W/TWL LRG LVL3 (GOWN DISPOSABLE) ×1
GOWN STRL REUS W/TWL XL LVL3 (GOWN DISPOSABLE) ×1
KIT BASIN OR (CUSTOM PROCEDURE TRAY) ×1 IMPLANT
KIT INST FIBULOCK NL STL DISP (Miscellaneous) IMPLANT
NS IRRIG 1000ML POUR BTL (IV SOLUTION) ×1 IMPLANT
PACK ORTHO EXTREMITY (CUSTOM PROCEDURE TRAY) ×1 IMPLANT
PAD CAST 4YDX4 CTTN HI CHSV (CAST SUPPLIES) ×1 IMPLANT
PADDING CAST COTTON 4X4 STRL (CAST SUPPLIES) ×1
PADDING CAST SYNTHETIC 4X4 STR (CAST SUPPLIES) IMPLANT
SHEET MEDIUM DRAPE 40X70 STRL (DRAPES) ×1 IMPLANT
SLEEVE SCD COMPRESS KNEE MED (STOCKING) ×1 IMPLANT
SPIKE FLUID TRANSFER (MISCELLANEOUS) IMPLANT
SPLINT PLASTER CAST FAST 5X30 (CAST SUPPLIES) IMPLANT
SPONGE T-LAP 18X18 ~~LOC~~+RFID (SPONGE) IMPLANT
STOCKINETTE 6  STRL (DRAPES) ×1
STOCKINETTE 6 STRL (DRAPES) ×1 IMPLANT
SUCTION FRAZIER HANDLE 10FR (MISCELLANEOUS)
SUCTION TUBE FRAZIER 10FR DISP (MISCELLANEOUS) IMPLANT
SUT ETHILON 3 0 PS 1 (SUTURE) ×1 IMPLANT
SUT MNCRL AB 3-0 PS2 18 (SUTURE) IMPLANT
SUT MNCRL AB 3-0 PS2 27 (SUTURE) IMPLANT
SUT MNCRL+ AB 3-0 CT1 36 (SUTURE) IMPLANT
SUT MONOCRYL AB 3-0 CT1 36IN (SUTURE) ×1
SUT VIC AB 0 CT1 27 (SUTURE) ×1
SUT VIC AB 0 CT1 27XBRD ANBCTR (SUTURE) IMPLANT
SUT VIC AB 2-0 CT2 27 (SUTURE) IMPLANT
SUT VIC AB 3-0 FS2 27 (SUTURE) IMPLANT
SYR BULB EAR ULCER 3OZ GRN STR (SYRINGE) ×1 IMPLANT
SYR CONTROL 10ML LL (SYRINGE) IMPLANT
SYSTEM IMPLANT FIBULOCK STRL (Miscellaneous) ×1 IMPLANT
TOWEL GREEN STERILE FF (TOWEL DISPOSABLE) ×2 IMPLANT
TUBE CONNECTING 20X1/4 (TUBING) IMPLANT
UNDERPAD 30X36 HEAVY ABSORB (UNDERPADS AND DIAPERS) ×1 IMPLANT

## 2022-12-04 NOTE — Transfer of Care (Signed)
Immediate Anesthesia Transfer of Care Note  Patient: [name]  Procedure(s) Performed: DEEP ORTHOPEDIC HARDWARE REMOVAL LEFT LEG LATERAL, ANTERIOR, MEDIAL, POSTERIOR (Left)  Patient Location: PACU  Anesthesia Type:General  Level of Consciousness: awake, drowsy, and patient cooperative  Airway & Oxygen Therapy: Patient Spontanous Breathing and Patient connected to nasal cannula oxygen  Post-op Assessment: Report given to RN and Post -op Vital signs reviewed and stable  Post vital signs: Reviewed and stable  Last Vitals:  Vitals Value Taken Time  BP 169/112 12/04/22 1608  Temp    Pulse 83 12/04/22 1611  Resp 13 12/04/22 1611  SpO2 95 % 12/04/22 1611  Vitals shown include unvalidated device data.  Last Pain:  Vitals:   12/04/22 1113  TempSrc:   PainSc: 0-No pain         Complications: No notable events documented.

## 2022-12-04 NOTE — Discharge Instructions (Signed)

## 2022-12-04 NOTE — H&P (Signed)
PREOPERATIVE H&P  Chief Complaint: Left infected deep orthopedic hardware, anterior, posterior, medial and lateral leg  HPI: [name] is a 47 y.o. male who presents for preoperative history and physical with a diagnosis of ankle arthrodesis after failed ORIF of trimalleolar fracture.  Unfortunately he developed significant cellulitis that was not improving well due to retained orthopedic hardware.  He was indicated to have the hardware removed completely from his leg to allow for healing of his infection.  He is here today for hardware removal.  He did heal his fusion and was doing quite well for several months postoperatively.. Symptoms are rated as moderate to severe, and have been worsening.  This is significantly impairing activities of daily living.  He has elected for surgical management.   Past Medical History:  Diagnosis Date   Cellulitis    History of kidney stones    Hypertension    per pt, resolved after weight loss   Personal history of colonic polyps    Prediabetes    Past Surgical History:  Procedure Laterality Date   ANKLE ARTHROSCOPY WITH ARTHRODESIS Left 06/25/2021   Procedure: LEFT ANKLE ARTHRODESIS,;  Surgeon: Erle Crocker, MD;  Location: [place];  Service: Orthopedics;  Laterality: Left;   INGUINAL HERNIA REPAIR Right 2008   KNEE ARTHROSCOPY Right 1994   ORIF ANKLE FRACTURE Left 10/02/2020   Procedure: OPEN REDUCTION INTERNAL FIXATION (ORIF) ANKLE FRACTURE;  Surgeon: Nicholes Stairs, MD;  Location: [place];  Service: Orthopedics;  Laterality: Left;   VASECTOMY     Social History   Socioeconomic History   Marital status: Single    Spouse name: Not on file   Number of children: Not on file   Years of education: Not on file   Highest education level: Not on file  Occupational History   Occupation: auto body estimator  Tobacco Use   Smoking status: Every Day    Packs/day: 0.50    Years: 27.00    Additional pack years: 0.00    Total pack  years: 13.50    Types: Cigarettes    Passive exposure: Past   Smokeless tobacco: Never  Vaping Use   Vaping Use: Former  Substance and Sexual Activity   Alcohol use: Yes    Comment: rare   Drug use: Never   Sexual activity: Yes    Partners: Female    Birth control/protection: Surgical, Condom  Other Topics Concern   Not on file  Social History Narrative   Not on file   Social Determinants of Health   Financial Resource Strain: Not on file  Food Insecurity: No Food Insecurity (11/11/2022)   Hunger Vital Sign    Worried About Running Out of Food in the Last Year: Never true    Ran Out of Food in the Last Year: Never true  Transportation Needs: Not on file  Physical Activity: Not on file  Stress: Not on file  Social Connections: Socially Isolated (11/11/2022)   Social Connection and Isolation Panel [NHANES]    Frequency of Communication with Friends and Family: More than three times a week    Frequency of Social Gatherings with Friends and Family: More than three times a week    Attends Religious Services: Never    Marine scientist or Organizations: No    Attends Archivist Meetings: Never    Marital Status: Divorced   Family History  Problem Relation Age of Onset   Skin cancer Mother    Heart  murmur Father    Osteoarthritis Sister    No Known Allergies Prior to Admission medications   Medication Sig Start Date End Date Taking? Authorizing Provider  cefTRIAXone (ROCEPHIN) IVPB Inject into the vein.   Yes [provider]  Heparin Sod, Pork, Lock Flush (HEPARIN, PORCINE, LOCK FLUSH IV) Inject into the vein.   Yes [provider]  ibuprofen (ADVIL) 200 MG tablet Take 800 mg by mouth every 8 (eight) hours as needed for headache, mild pain or moderate pain.   Yes [provider]  linezolid (ZYVOX) 600 MG tablet Take 1 tablet (600 mg total) by mouth 2 (two) times daily. 12/02/22  Yes Rosiland Oz, MD  nicotine polacrilex  (NICORETTE) 2 MG gum Take 1 each (2 mg total) by mouth as needed for smoking cessation. 11/11/22  Yes Katsadouros, Vasilios, MD  oxymetazoline (AFRIN) 0.05 % nasal spray Place 1-2 sprays into both nostrils at bedtime as needed for congestion.   Yes [provider]  varenicline (CHANTIX) 0.5 MG tablet Take 1 tablet (0.5 mg total) by mouth daily for 3 days, THEN 1 tablet (0.5 mg total) 2 (two) times daily for 7 days, THEN 2 tablets (1 mg total) 2 (two) times daily. 11/11/22 12/21/22 Yes Katsadouros, Vasilios, MD     Positive ROS: All other systems have been reviewed and were otherwise negative with the exception of those mentioned in the HPI and as above.  Physical Exam:  Vitals:   12/04/22 1047  BP: 128/83  Pulse: 89  Resp: 18  Temp: 98.1 F (36.7 C)  SpO2: 98%   General: Alert, no acute distress Cardiovascular: No pedal edema Respiratory: No cyanosis, no use of accessory musculature GI: No organomegaly, abdomen is soft and non-tender Skin: No lesions in the area of chief complaint Neurologic: Sensation intact distally Psychiatric: Patient is competent for consent with normal mood and affect Lymphatic: No axillary or cervical lymphadenopathy  MUSCULOSKELETAL: Left leg demonstrates evidence of resolving cellulitis.  Swelling is better than previous.  History of prior ankle fusion and all of the surgical incisions are well-healed.  No draining wounds.  Assessment: Cellulitis of the left leg with retained deep orthopedic hardware   Plan: Plan for complete hardware removal of the anterior, lateral, posterior and medial hardware about the leg all through separate incisions.  Patient had his PICC line removed recently and has a follow-up with his infectious disease doctor next week.  Patient would like to keep the hardware.  He will be discharged from the PACU..  We discussed the risks, benefits and alternatives of surgery which include but are not limited to wound healing  complications, infection, nonunion, malunion, need for further surgery, damage to surrounding structures and continued pain.  They understand there is no guarantees to an acceptable outcome.  After weighing these risks they opted to proceed with surgery.     Erle Crocker, MD    12/04/2022 12:40 PM

## 2022-12-04 NOTE — Anesthesia Postprocedure Evaluation (Signed)
Anesthesia Post Note  Patient: [name]  Procedure(s) Performed: DEEP ORTHOPEDIC HARDWARE REMOVAL LEFT LEG LATERAL, ANTERIOR, MEDIAL, POSTERIOR (Left)     Patient location during evaluation: PACU Anesthesia Type: General Level of consciousness: awake and alert and oriented Pain management: pain level controlled Vital Signs Assessment: post-procedure vital signs reviewed and stable Respiratory status: spontaneous breathing, nonlabored ventilation and respiratory function stable Cardiovascular status: blood pressure returned to baseline and stable Postop Assessment: no apparent nausea or vomiting Anesthetic complications: no   No notable events documented.  Last Vitals:  Vitals:   12/04/22 1610 12/04/22 1625  BP: (!) 144/99 124/84  Pulse: 83 86  Resp: 20 12  Temp:    SpO2: 96% 95%    Last Pain:  Vitals:   12/04/22 1625  TempSrc:   PainSc: 8                  Jacobi Nile A.

## 2022-12-04 NOTE — Anesthesia Procedure Notes (Signed)
Procedure Name: Intubation Date/Time: 12/04/2022 2:06 PM  Performed by: Mosetta Pigeon, CRNAPre-anesthesia Checklist: Patient identified, Emergency Drugs available, Suction available and Patient being monitored Patient Re-evaluated:Patient Re-evaluated prior to induction Oxygen Delivery Method: Circle System Utilized Preoxygenation: Pre-oxygenation with 100% oxygen Induction Type: IV induction Ventilation: Two handed mask ventilation required Laryngoscope Size: Glidescope and 3 Grade View: Grade I Tube type: Oral Tube size: 7.5 mm Number of attempts: 1 Airway Equipment and Method: Rigid stylet and Video-laryngoscopy Placement Confirmation: ETT inserted through vocal cords under direct vision, positive ETCO2 and breath sounds checked- equal and bilateral Secured at: 23 cm Tube secured with: Tape Dental Injury: Teeth and Oropharynx as per pre-operative assessment

## 2022-12-04 NOTE — Op Note (Addendum)
[name] male 47 y.o. 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  The case was significantly difficult due to the retention of hardware and bony overgrowth posteriorly, anteriorly and laterally.  ANESTHESIA: General with peripheral nerve block  FINDINGS: See below  IMPLANTS: None  INDICATIONS:46 y.o. male underwent open treatment of trimalleolar ankle fracture previously with fibular nail and posterior buttress plating and screw fixation as well as tight rope fixation.  He subsequently failed that and developed significant posttraumatic arthritis.  He was indicated for ankle arthrodesis and had his ankle fused.  He did well and healed the fusion however several months later presented to the emergency department with significant left leg cellulitis.  He was placed on antibiotics and was given a PICC line.  Unfortunately he was slow to heal his wound and there was concern for the deep hardware being a nidus for infection and was indicated for complete hardware removal.   Patient understood the risks, benefits and alternatives to surgery which include but are not limited to wound healing complications, infection, nonunion, malunion,  need for further surgery as well as damage to surrounding structures. They also understood the potential for continued pain in that there were no guarantees of acceptable outcome After weighing these risks the patient opted to proceed with surgery.  PROCEDURE: Patient was identified in the preoperative holding area.  The left leg was marked by myself.  Consent was signed by myself and the patient.  Block was performed by anesthesia in the preoperative holding area.  Patient was taken to the operative suite and placed supine on the operative table.  General anesthesia was induced without difficulty.  The patient was placed on a beanbag in a sloppy lateral position.  All bony prominences were well-padded and bone foam was used.  All bony prominences were well padded.  Tourniquet was placed on the operative thigh.  Preoperative antibiotics were given. The extremity was prepped and draped in the usual sterile fashion and surgical timeout was performed.  The limb was elevated and the tourniquet was inflated to 250 mmHg.  We began with removing the hardware in the posterior leg.  The knee was flexed and the leg was placed in a lateral position.  An incision was made overlying the previously made incision along the posterior aspect of the leg.  The incision was posterior to the peroneal tendons.  The incision was carried sharply down through skin and subcutaneous tissue and through scar tissue behind the peroneal tendons.  An interval was discovered that was previously used to gain access to the posterior tibia.  This was followed down and the plate was identified.  There is significant bony overgrowth of the plate that was removed using an osteotome and a mallet.  Then once the bony overgrowth was removed the plate was visible and the screw heads were also visible.  Screwdriver was used to remove the screws from the plate sequentially.  There was a single screw that was outside of the plate and care was taken to  identify this.  There was significant amount of bony overgrowth and a rondure and osteotome was used to remove the bony overgrowth from the screw head.  Then once it was identified it was removed uneventfully.  The wound was then inspected and found that there was no retained hardware posteriorly.  We then turned our attention to the lateral ankle.  An incision was made overlying the lateral ankle at the site of the screw through the fibula lock nail.  This was also to gain access to the lateral suture button.  The screw head was identified after dissection down the lateral fibula screw was removed uneventfully.  Then the tight rope button was identified however it was sunk in the bone approximately 5 to 6 mm.  The bone was removed overlying the suture button using an osteotome.  Then once the suture button was identified the suture material was cut and the button was removed.  We then turned our attention to the fibula canal.  A separate incision was made at the distal aspect of the fibula.  Blunt dissection was used to gain access to the tip of the fibula and the fibular nail.  Then the device was placed within the fibular nail to retract the tines proximally.  This was done successfully.  Then the extraction device was placed in the tip of the nail and the nail was removed uneventfully.  We then turned our attention to the anterior ankle.  An separate incision was made along the anterior aspect of the ankle at the prior site of his ankle fusion.  This was carried sharply down through skin and subcutaneous tissue.  The extensor retinaculum overlying the tibialis anterior tendon was incised in line with the incision.  Further sharp and blunt dissection was carried down to the anterior tibia with care to protect the neurovascular bundle.  The plate was identified and the screws were removed in sequential fashion.  There is a combination of locking nonlocking screws and it was removed without difficulty.  There  was some bony overgrowth of the plate proximally that was removed using an osteotome and the plate was elevated off the bone.  Bony prominences were smoothed with a rongeured and the bony and fibrous tissue was sent for culture.  We then turned our attention to the medial ankle.  The incision from previously placed lag screw was identified and a separate incision was made overlying this area.  The dissection was carried deep to the bone.  The head of the screw was identified and this wire was placed within the head of the lag screw.  The lag screw was then removed without difficulty.  We then used fluoroscopy to gain location of the medial suture button.  This was identified fluoroscopically and an incision was made overlying the button.  It was carried sharply down through skin and subcutaneous tissue down to bone.  The suture button was identified and the bony overgrowth was removed with a rongeured and osteotome and then the button was elevated.  We were able to remove the button and all 4 strands of the FiberWire suture from the tight rope was also removed.  We then sent culture in the form of bone and soft tissue from the medial wound as well as this was an area of residual cellulitic appearance.  Due to concern for potential osteomyelitis given that his infection did not heal  we performed saucerization of the anterior aspect of the distal tibia where the arthrodesis plate was.  Osteotome was used to remove a portion of the cortex of the bone to gain access to the central canal and take bony samples that were then sent to the microbiology lab for culture.  The bone did not appear infected.  The cortical window was then replaced.  Copious amounts of normal saline was used to irrigate all of the wounds.  Then we used 1 g of vancomycin powder divided and placed it in all of the wounds for local antibiotic therapy.  Then the wounds were closed in a layered fashion using 2-0 Vicryl, 3-0 Monocryl and  staples.  Soft dressing and short leg splint were placed.  Tourniquet was released.  He was awakened from anesthesia and taken recovery in stable condition.  No complications.  He tolerated the procedure well.  The patient wished to keep his hardware and signed a release form.  He will be given the hardware.   POST OPERATIVE INSTRUCTIONS: Nonweightbearing to operative extremity Follow-up in 2 weeks for wound check and suture removal if appropriate Follow-up on cultures. Continue antibiotics per infectious disease recommendations.  TOURNIQUET TIME: Less than 2 hours  BLOOD LOSS:  Minimal         DRAINS: none         SPECIMEN: none       COMPLICATIONS:  * No complications entered in OR log *         Disposition: PACU - hemodynamically stable.         Condition: stable

## 2022-12-04 NOTE — Anesthesia Preprocedure Evaluation (Addendum)
Anesthesia Evaluation  Patient identified by MRN, date of birth, ID band  Reviewed: Allergy & Precautions, NPO status , Patient's Chart, lab work & pertinent test results  Airway Mallampati: I  TM Distance: >3 FB Neck ROM: Full    Dental  (+) Missing, Dental Advisory Given,    Pulmonary Current SmokerPatient did not abstain from smoking.   Pulmonary exam normal breath sounds clear to auscultation       Cardiovascular hypertension, Normal cardiovascular exam Rhythm:Regular Rate:Normal  Hx/o HTN resolved after weight loss   Neuro/Psych negative neurological ROS  negative psych ROS   GI/Hepatic negative GI ROS, Neg liver ROS,,,  Endo/Other  Pre diabetes Obesity  Renal/GU negative Renal ROS  negative genitourinary   Musculoskeletal  (+) Arthritis ,  Retained deep hardware left ankle S/P ORIF trimalleolar Fx Osteomyelitis left ankle   Abdominal  (+) + obese  Peds  Hematology negative hematology ROS (+)   Anesthesia Other Findings   Reproductive/Obstetrics                             Anesthesia Physical Anesthesia Plan  ASA: 2  Anesthesia Plan: General   Post-op Pain Management: Dilaudid IV, Tylenol PO (pre-op)*, Precedex and Ketamine IV*   Induction: Intravenous  PONV Risk Score and Plan: 2  Airway Management Planned: LMA and Oral ETT  Additional Equipment: None  Intra-op Plan:   Post-operative Plan: Extubation in OR  Informed Consent: I have reviewed the patients History and Physical, chart, labs and discussed the procedure including the risks, benefits and alternatives for the proposed anesthesia with the patient or authorized representative who has indicated his/her understanding and acceptance.     Dental advisory given  Plan Discussed with: CRNA and Anesthesiologist  Anesthesia Plan Comments:          Anesthesia Quick Evaluation

## 2022-12-05 ENCOUNTER — Encounter (HOSPITAL_COMMUNITY): Payer: Self-pay | Admitting: Orthopaedic Surgery

## 2022-12-09 ENCOUNTER — Encounter: Payer: Self-pay | Admitting: Infectious Diseases

## 2022-12-09 ENCOUNTER — Other Ambulatory Visit: Payer: Self-pay

## 2022-12-09 ENCOUNTER — Ambulatory Visit (INDEPENDENT_AMBULATORY_CARE_PROVIDER_SITE_OTHER): Payer: Worker's Compensation | Admitting: Infectious Diseases

## 2022-12-09 VITALS — BP 119/79 | HR 92 | Resp 15 | Ht 73.0 in | Wt 280.0 lb

## 2022-12-09 DIAGNOSIS — H539 Unspecified visual disturbance: Secondary | ICD-10-CM

## 2022-12-09 DIAGNOSIS — Z5181 Encounter for therapeutic drug level monitoring: Secondary | ICD-10-CM

## 2022-12-09 DIAGNOSIS — M869 Osteomyelitis, unspecified: Secondary | ICD-10-CM | POA: Insufficient documentation

## 2022-12-09 DIAGNOSIS — M8618 Other acute osteomyelitis, other site: Secondary | ICD-10-CM

## 2022-12-09 LAB — AEROBIC/ANAEROBIC CULTURE W GRAM STAIN (SURGICAL/DEEP WOUND)
Culture: NO GROWTH
Culture: NO GROWTH
Gram Stain: NONE SEEN
Gram Stain: NONE SEEN

## 2022-12-09 MED ORDER — LINEZOLID 600 MG PO TABS: 600.0000 mg | ORAL_TABLET | Freq: Two times a day (BID) | ORAL | 0 refills | Status: AC

## 2022-12-09 NOTE — Progress Notes (Addendum)
Patient Active Problem List   Diagnosis Date Noted   Medication monitoring encounter 11/19/2022   PICC (peripherally inserted central catheter) in place [id number]   Hardware complicating wound infection ([place]) 11/19/2022   History of colonic polyps 11/12/2022   Cellulitis of left lower extremity 11/03/2022   Bacteremia due to Streptococcus 10/31/2022   Essential hypertension 10/30/2022   Tobacco dependence 10/30/2022   Closed displaced trimalleolar fracture of left ankle 10/02/2020   Current Outpatient Medications on File Prior to Visit  Medication Sig Dispense Refill   aspirin (BAYER ASPIRIN) 325 MG tablet Take 1 tablet by mouth for 30 DAYS for blood clot prevention 30 tablet 0   Heparin Sod, Pork, Lock Flush (HEPARIN, PORCINE, LOCK FLUSH IV) Inject into the vein.     ibuprofen (ADVIL) 200 MG tablet Take 800 mg by mouth every 8 (eight) hours as needed for headache, mild pain or moderate pain.     methocarbamol (ROBAXIN-750) 750 MG tablet Take 1 tablet by mouth every 6 to 8 hours as needed for spasms 30 tablet 0   nicotine polacrilex (NICORETTE) 2 MG gum Take 1 each (2 mg total) by mouth as needed for smoking cessation. 100 tablet 0   oxymetazoline (AFRIN) 0.05 % nasal spray Place 1-2 sprays into both nostrils at bedtime as needed for congestion.     varenicline (CHANTIX) 0.5 MG tablet Take 1 tablet (0.5 mg total) by mouth daily for 3 days, THEN 1 tablet (0.5 mg total) 2 (two) times daily for 7 days, THEN 2 tablets (1 mg total) 2 (two) times daily. 137 tablet 0   No current facility-administered medications on file prior to visit.      Subjective: 47 year old male with PMH of hypertension, kidney stones, prior left tri-malleolar # s/p ORIF (10/02/2020) with secondary fall causing opening of wounds s/p 2 courses of cephalexin wh prior left tri-malleolar # s/p ORIF (10/02/2020)en seen by wound care [id number] then complicated with avascular necrosis s/p left ankle arthrodesis (06/25/2021,  OR cx no growth) who is here for HFU for Group G streptococcus bacteremia and associated left foot and leg cellulitis. Seen by Orthopedics and ID inpatient, no surgical intervention done and plan was to follow OP for possible orthopedic intervention. Patient was discharged on 2/20 to complete 4 weeks of IV ceftriaxone through PICC with possible transition to PO abtx until hardware is out.   He received IV ceftriaxone until 3/19 after which abtx was switched to PO linezolid which he has been taking until now. Underwent 3/21 removal of deep orthopedic hardware in left posterior leg,  left lateral ankle, left anterior ankle and medial ankle + saucerization of tibia. OR cx NG. Per OR notes, grossly distal tibia did not look infected. Taking linezolid from 3/19 without missing doses or concerns. PICC line has been removed. He is non weight bearing. Reports having some difficulty with distant vision after surgery which he thinks is related to new medications started after surgery like aspirin, chantix and meds for his spasms. Reports he is going to stop medication for spasms and see if it helps. Reports being seen by Ophthalmology a year ago and nothing abnormal was told. He also feels he is getting old and likely vision changes are age related.  Denies fevers, chills, headache, focal weakness, numbness or other neurologic complaints. He has an upcoming appt with orthopedics.   Review of Systems: all systems reviewed with pertinent positives and negatives as listed above  Past Medical History:  Diagnosis Date  Cellulitis    History of kidney stones    Hypertension    per pt, resolved after weight loss   Personal history of colonic polyps    Prediabetes    Past Surgical History:  Procedure Laterality Date   ANKLE ARTHROSCOPY WITH ARTHRODESIS Left 06/25/2021   Procedure: LEFT ANKLE ARTHRODESIS,;  Surgeon: Erle Crocker, MD;  Location: [facility];  Service: Orthopedics;  Laterality: Left;   HARDWARE  REMOVAL Left 12/04/2022   Procedure: DEEP ORTHOPEDIC HARDWARE REMOVAL LEFT LEG LATERAL, ANTERIOR, MEDIAL, POSTERIOR;  Surgeon: Erle Crocker, MD;  Location: [facility];  Service: Orthopedics;  Laterality: Left;  LENGTH OF SUGERY: 120 MINUTES   INGUINAL HERNIA REPAIR Right 2008   KNEE ARTHROSCOPY Right 1994   ORIF ANKLE FRACTURE Left 10/02/2020   Procedure: OPEN REDUCTION INTERNAL FIXATION (ORIF) ANKLE FRACTURE;  Surgeon: Nicholes Stairs, MD;  Location: [facility];  Service: Orthopedics;  Laterality: Left;   VASECTOMY      Social History   Tobacco Use   Smoking status: Every Day    Packs/day: 0.50    Years: 27.00    Additional pack years: 0.00    Total pack years: 13.50    Types: Cigarettes    Passive exposure: Past   Smokeless tobacco: Never   Tobacco comments:    Cut down to 8 a day with chantix.   Vaping Use   Vaping Use: Former  Substance Use Topics   Alcohol use: Yes    Comment: rare   Drug use: Never    Family History  Problem Relation Age of Onset   Skin cancer Mother    Heart murmur Father    Osteoarthritis Sister     No Known Allergies  Health Maintenance  Topic Date Due   Hepatitis C Screening  Never done   COLONOSCOPY (Pts 45-71yrs Insurance coverage will need to be confirmed)  Never done   INFLUENZA VACCINE  12/14/2022 (Originally 04/15/2022)   COVID-19 Vaccine (1) 12/18/2022 (Originally 10/22/1976)   HIV Screening  Completed   HPV VACCINES  Aged Out   DTaP/Tdap/Td  Discontinued    Objective:  Vitals:   12/09/22 1529  BP: 119/79  Pulse: 92  Resp: 15  SpO2: 98%  Weight: 280 lb (127 kg)  Height: 6\' 1"  (1.854 m)   Body mass index is 36.94 kg/m.  Physical Exam Constitutional:      Appearance: Normal appearance. Obese  HENT:     Head: Normocephalic and atraumatic.      Mouth: Mucous membranes are moist.  Eyes:    Conjunctiva/sclera: Conjunctivae normal.     Pupils: Pupils are equal, round, and bilaterally symmetrical    Cardiovascular:      Rate and Rhythm: Normal rate and regular rhythm.     Heart sounds: s1s2  Pulmonary:     Effort: Pulmonary effort is normal.     Breath sounds: Normal breath sounds.   Abdominal:     General: Non distended     Palpations: soft.   Musculoskeletal:        General: Left foot and leg with is wrapped in a cast, he is non weight bearing   Skin:    General: Skin is warm and dry.     Comments: Rt arm PICC has been removed   Neurological:     General: grossly non focal     Mental Status: awake, alert and oriented to person, place, and time.   Psychiatric:  Mood and Affect: Mood normal.   Lab Results Lab Results  Component Value Date   WBC 9.1 11/04/2022   HGB 14.0 11/04/2022   HCT 42.6 11/04/2022   MCV 90.6 11/04/2022   PLT 248 11/04/2022    Lab Results  Component Value Date   CREATININE 0.97 11/04/2022   BUN 16 11/04/2022   NA 134 (L) 11/04/2022   K 4.8 11/04/2022   CL 99 11/04/2022   CO2 26 11/04/2022    Lab Results  Component Value Date   ALT 25 10/30/2022   AST 22 10/30/2022   ALKPHOS 75 10/30/2022   BILITOT 0.7 10/30/2022    No results found for: "CHOL", "HDL", "LDLCALC", "LDLDIRECT", "TRIG", "CHOLHDL" No results found for: "LABRPR", "RPRTITER" No results found for: "HIV1RNAQUANT", "HIV1RNAVL", "CD4TABS"   Microbiology Results for orders placed or performed during the hospital encounter of 12/04/22  Aerobic/Anaerobic Culture w Gram Stain (surgical/deep wound)     Status: None   Collection Time: 12/04/22  3:07 PM   Specimen: PATH Other; Tissue  Result Value Ref Range Status   Specimen Description TISSUE LEFT ANKLE  Final   Special Requests DEEP ANTERIOR  Final   Gram Stain NO WBC SEEN NO ORGANISMS SEEN   Final   Culture   Final    No growth aerobically or anaerobically. Performed at [facility], [place] [address] [place], [place] [address]    Report Status 12/09/2022 FINAL  Final  Aerobic/Anaerobic Culture w Gram Stain (surgical/deep wound)      Status: None   Collection Time: 12/04/22  3:11 PM   Specimen: PATH Other; Tissue  Result Value Ref Range Status   Specimen Description TISSUE LEFT ANKLE  Final   Special Requests MEDIAL  Final   Gram Stain NO WBC SEEN NO ORGANISMS SEEN   Final   Culture   Final    No growth aerobically or anaerobically. Performed at [facility], [place] [address], [place],  [address]    Report Status 12/09/2022 FINAL  Final    Imaging  DG MINI C-ARM IMAGE ONLY  Result Date: 12/04/2022 There is no interpretation for this exam.  This order is for images obtained during a surgical procedure.  Please See "Surgeries" Tab for more information regarding the procedure.    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 OR cx NG ( on abtx) . Per OR notes, grossly distal tibia did not look infected    Plan  Continue Linezolid, plan to complete 6 weeks from 3/21 if CBC allows Does not appear any remaining hardware but will confirm with Dr Lucia Gaskins CBC in a week  Fu in 3 weeks  Fu with orthopedics as instructed   # Medication management  - CBC, ESR and CRP next week   # Visual difficulty  - reports as difficulty with distant vision since surgery with multiple new medications started simultaneously. Unlikely optic neuritis related to linezolid use as he was on linezolid for only  2 days before symptom - Discussed to see ophthalmology -  Discussed to notify us if worsening symptoms or no improvement of symptoms after stopping methocarbamol, will switch to another antibiotic   I have personally spent 42 minutes involved in face-to-face and non-face-to-face activities for this patient on the day of the visit. Professional time spent includes the following activities: Preparing to see the patient (review of tests), Obtaining and/or reviewing separately obtained history (admission/discharge record), Performing a medically appropriate examination and/or evaluation , Ordering medications/tests/procedures, referring and communicating with other health care professionals, Documenting clinical information in the EMR, Independently interpreting results (not separately reported), Communicating results to the patient/family/caregiver, Counseling and educating the patient/family/caregiver and Care coordination (not separately reported).   Wilber Oliphant, [facility] 12/09/2022, 3:50 PM

## 2022-12-17 ENCOUNTER — Other Ambulatory Visit: Payer: Worker's Compensation

## 2022-12-17 ENCOUNTER — Other Ambulatory Visit: Payer: Self-pay

## 2022-12-17 DIAGNOSIS — Z5181 Encounter for therapeutic drug level monitoring: Secondary | ICD-10-CM

## 2022-12-18 ENCOUNTER — Telehealth: Payer: Self-pay

## 2022-12-18 LAB — CBC
HCT: 42.2 % (ref 38.5–50.0)
Hemoglobin: 14.3 g/dL (ref 13.2–17.1)
MCH: 30 pg (ref 27.0–33.0)
MCHC: 33.9 g/dL (ref 32.0–36.0)
MCV: 88.7 fL (ref 80.0–100.0)
MPV: 9.8 fL (ref 7.5–12.5)
Platelets: 230 10*3/uL (ref 140–400)
RBC: 4.76 10*6/uL (ref 4.20–5.80)
RDW: 13.2 % (ref 11.0–15.0)
WBC: 9.6 10*3/uL (ref 3.8–10.8)

## 2022-12-18 LAB — SEDIMENTATION RATE: Sed Rate: 9 mm/h (ref 0–15)

## 2022-12-18 LAB — C-REACTIVE PROTEIN: CRP: 6.2 mg/L (ref <8.0)

## 2022-12-18 NOTE — Telephone Encounter (Signed)
-----   Message from Rosiland Oz, MD sent at 12/18/2022  8:32 AM EDT ----- Please let him know that labs are unremarkable.

## 2023-01-06 ENCOUNTER — Ambulatory Visit: Payer: BLUE CROSS/BLUE SHIELD | Admitting: Infectious Diseases

## 2023-02-03 ENCOUNTER — Encounter: Payer: Self-pay | Admitting: Infectious Diseases

## 2023-02-03 ENCOUNTER — Ambulatory Visit: Payer: Worker's Compensation | Admitting: Infectious Diseases

## 2023-02-03 ENCOUNTER — Other Ambulatory Visit: Payer: Self-pay

## 2023-02-03 VITALS — BP 132/82 | HR 80 | Temp 97.9°F | Wt 289.0 lb

## 2023-02-03 DIAGNOSIS — R7881 Bacteremia: Secondary | ICD-10-CM | POA: Diagnosis not present

## 2023-02-03 DIAGNOSIS — M869 Osteomyelitis, unspecified: Secondary | ICD-10-CM

## 2023-02-03 DIAGNOSIS — B951 Streptococcus, group B, as the cause of diseases classified elsewhere: Secondary | ICD-10-CM

## 2023-02-03 DIAGNOSIS — Z79899 Other long term (current) drug therapy: Secondary | ICD-10-CM | POA: Diagnosis not present

## 2023-02-03 NOTE — Progress Notes (Signed)
Patient Active Problem List   Diagnosis Date Noted   Osteomyelitis (HCC) 12/09/2022   Vision changes 12/09/2022   Medication monitoring encounter 11/19/2022   PICC (peripherally inserted central catheter) in place 11/19/2022   Hardware complicating wound infection (HCC) 11/19/2022   History of colonic polyps 11/12/2022   Cellulitis of left lower extremity 11/03/2022   Bacteremia due to Streptococcus 10/31/2022   Essential hypertension 10/30/2022   Tobacco dependence 10/30/2022   Closed displaced trimalleolar fracture of left ankle 10/02/2020   Current Outpatient Medications on File Prior to Visit  Medication Sig Dispense Refill   aspirin (BAYER ASPIRIN) 325 MG tablet Take 1 tablet by mouth for 30 DAYS for blood clot prevention 30 tablet 0   Heparin Sod, Pork, Lock Flush (HEPARIN, PORCINE, LOCK FLUSH IV) Inject into the vein.     ibuprofen (ADVIL) 200 MG tablet Take 800 mg by mouth every 8 (eight) hours as needed for headache, mild pain or moderate pain.     methocarbamol (ROBAXIN-750) 750 MG tablet Take 1 tablet by mouth every 6 to 8 hours as needed for spasms 30 tablet 0   nicotine polacrilex (NICORETTE) 2 MG gum Take 1 each (2 mg total) by mouth as needed for smoking cessation. 100 tablet 0   oxymetazoline (AFRIN) 0.05 % nasal spray Place 1-2 sprays into both nostrils at bedtime as needed for congestion.     No current facility-administered medications on file prior to visit.      Subjective: 47 year old male with PMH of hypertension, kidney stones, prior left tri-malleolar # s/p ORIF (10/02/2020) with secondary fall causing opening of wounds s/p 2 courses of cephalexin wh prior left tri-malleolar # s/p ORIF (10/02/2020)en seen by wound care 12/2020 then complicated with avascular necrosis s/p left ankle arthrodesis (06/25/2021, OR cx no growth) who is here for HFU for Group G streptococcus bacteremia and associated left foot and leg cellulitis. Seen by Orthopedics and ID inpatient,  no surgical intervention done and plan was to follow OP for possible orthopedic intervention. Patient was discharged on 2/20 to complete 4 weeks of IV ceftriaxone through PICC with possible transition to PO abtx until hardware is out.   He received IV ceftriaxone until 3/19 after which abtx was switched to PO linezolid which he has been taking until now. Underwent 3/21 removal of deep orthopedic hardware in left posterior leg,  left lateral ankle, left anterior ankle and medial ankle + saucerization of tibia. OR cx NG. Per OR notes, grossly distal tibia did not look infected. Taking linezolid from 3/19 without missing doses or concerns. PICC line has been removed. He is non weight bearing. Reports having some difficulty with distant vision after surgery which he thinks is related to new medications started after surgery like aspirin, chantix and meds for his spasms. Reports he is going to stop medication for spasms and see if it helps. Reports being seen by Ophthalmology a year ago and nothing abnormal was told. He also feels he is getting old and likely vision changes are age related.  Denies fevers, chills, headache, focal weakness, numbness or other neurologic complaints. He has an upcoming appt with orthopedics.  02/03/23 Completed Linezolid 5/31. He is not taking abtx since 5/31. Has been following Dr Susa Simmonds every 2 weeks and no concerns at left ankle per his report. He has no concerns in his left ankle but wears compression stocking for the swelling which helps. Denies pain, erythema or tenderness. He has no discomfort walking. Next fu with  Dr Susa Simmonds tomorrow.  O complaints otherwise.   Review of Systems: all systems reviewed with pertinent positives and negatives as listed above  Past Medical History:  Diagnosis Date   Cellulitis    History of kidney stones    Hypertension    per pt, resolved after weight loss   Personal history of colonic polyps    Prediabetes    Past Surgical History:   Procedure Laterality Date   ANKLE ARTHROSCOPY WITH ARTHRODESIS Left 06/25/2021   Procedure: LEFT ANKLE ARTHRODESIS,;  Surgeon: Terance Hart, MD;  Location: [facility] OR;  Service: Orthopedics;  Laterality: Left;   HARDWARE REMOVAL Left 12/04/2022   Procedure: DEEP ORTHOPEDIC HARDWARE REMOVAL LEFT LEG LATERAL, ANTERIOR, MEDIAL, POSTERIOR;  Surgeon: Terance Hart, MD;  Location: [facility] OR;  Service: Orthopedics;  Laterality: Left;  LENGTH OF SUGERY: 120 MINUTES   INGUINAL HERNIA REPAIR Right 2008   KNEE ARTHROSCOPY Right 1994   ORIF ANKLE FRACTURE Left 10/02/2020   Procedure: OPEN REDUCTION INTERNAL FIXATION (ORIF) ANKLE FRACTURE;  Surgeon: Yolonda Kida, MD;  Location: [facility] OR;  Service: Orthopedics;  Laterality: Left;   VASECTOMY      Social History   Tobacco Use   Smoking status: Every Day    Packs/day: 0.50    Years: 27.00    Additional pack years: 0.00    Total pack years: 13.50    Types: Cigarettes    Passive exposure: Past   Smokeless tobacco: Never   Tobacco comments:    Cut down to 8 a day with chantix.   Vaping Use   Vaping Use: Former  Substance Use Topics   Alcohol use: Yes    Comment: rare   Drug use: Never    Family History  Problem Relation Age of Onset   Skin cancer Mother    Heart murmur Father    Osteoarthritis Sister     No Known Allergies  Health Maintenance  Topic Date Due   COVID-19 Vaccine (1) Never done   Hepatitis C Screening  Never done   COLONOSCOPY (Pts 45-30yrs Insurance coverage will need to be confirmed)  Never done   INFLUENZA VACCINE  04/16/2023   HIV Screening  Completed   HPV VACCINES  Aged Out   DTaP/Tdap/Td  Discontinued    Objective: BP 132/82   Pulse 80   Temp 97.9 F (36.6 C) (Oral)   Wt 289 lb (131.1 kg)   SpO2 97%   BMI 38.13 kg/m   Physical Exam Constitutional:      Appearance: Normal appearance. Obese  HENT:     Head: Normocephalic and atraumatic.      Mouth: Mucous membranes are moist.  Eyes:     Conjunctiva/sclera: Conjunctivae normal.     Pupils: Pupils are equal, round, and bilaterally symmetrical    Cardiovascular:     Rate and Rhythm: Normal rate and regular rhythm.     Heart sounds: s1s2  Pulmonary:     Effort: Pulmonary effort is normal.     Breath sounds: Normal breath sounds.   Abdominal:     General: Non distended     Palpations: soft.   Musculoskeletal:        General: Left foot and leg with mild swelling but no concerns for cellulitis or infection/no concerns for septic left ankle. He is ambulatory  Skin:    General: Skin is warm and dry.     Comments:  Neurological:     General: grossly non focal  Mental Status: awake, alert and oriented to person, place, and time.   Psychiatric:        Mood and Affect: Mood normal.   Lab Results Lab Results  Component Value Date   WBC 9.6 12/17/2022   HGB 14.3 12/17/2022   HCT 42.2 12/17/2022   MCV 88.7 12/17/2022   PLT 230 12/17/2022    Lab Results  Component Value Date   CREATININE 0.97 11/04/2022   BUN 16 11/04/2022   NA 134 (L) 11/04/2022   K 4.8 11/04/2022   CL 99 11/04/2022   CO2 26 11/04/2022    Lab Results  Component Value Date   ALT 25 10/30/2022   AST 22 10/30/2022   ALKPHOS 75 10/30/2022   BILITOT 0.7 10/30/2022    No results found for: "CHOL", "HDL", "LDLCALC", "LDLDIRECT", "TRIG", "CHOLHDL" No results found for: "LABRPR", "RPRTITER" No results found for: "HIV1RNAQUANT", "HIV1RNAVL", "CD4TABS"   Microbiology Results for orders placed or performed during the hospital encounter of 12/04/22  Aerobic/Anaerobic Culture w Gram Stain (surgical/deep wound)     Status: None   Collection Time: 12/04/22  3:07 PM   Specimen: PATH Other; Tissue  Result Value Ref Range Status   Specimen Description TISSUE LEFT ANKLE  Final   Special Requests DEEP ANTERIOR  Final   Gram Stain NO WBC SEEN NO ORGANISMS SEEN   Final   Culture   Final    No growth aerobically or anaerobically. Performed at [facility], [address], [place], [place] [address]    Report Status 12/09/2022 FINAL  Final  Aerobic/Anaerobic Culture w Gram Stain (surgical/deep wound)     Status: None   Collection Time: 12/04/22  3:11 PM   Specimen: PATH Other; Tissue  Result Value Ref Range Status   Specimen Description TISSUE LEFT ANKLE  Final   Special Requests MEDIAL  Final   Gram Stain NO WBC SEEN NO ORGANISMS SEEN   Final   Culture   Final    No growth aerobically or anaerobically. Performed at [facility], [address], [place], [place] [address]    Report Status 12/09/2022 FINAL  Final    Imaging  No results found.  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 OR cx NG ( on abtx) . Per OR notes, grossly distal tibia did not look infected Linezolid stopped on 12/14/22  Plan  No need for further abtx as left ankle has healed now esp being off abtx since 12/14/22 Discussed warning signs and symptoms to seek immediate attention in case of recurrence of infection  Fu with Orthopedics as instructed  Fu with [place] as needed   # Visual difficulty  - no concerns/resolved   I have personally spent 33 minutes involved in face-to-face and non-face-to-face activities for this patient on the day of the visit. Professional time spent includes the following activities: Preparing to see the patient (review of tests), Obtaining and/or reviewing separately  obtained history (admission/discharge record), Performing a medically  appropriate examination and/or evaluation , Ordering medications/tests/procedures, referring and communicating with other health care professionals, Documenting clinical information in the EMR, Independently interpreting results (not separately reported), Communicating results to the patient/family/caregiver, Counseling and educating the patient/family/caregiver and Care coordination (not separately reported).   Victoriano Lain, MD [facility] 02/03/2023, 10:52 AM
